# Patient Record
Sex: Female | Born: 1935 | Hispanic: No | Marital: Single | Smoking: Never smoker
Health system: Southern US, Community
[De-identification: ages and names within clinical notes are randomized; demographics above are authoritative.]

## PROBLEM LIST (undated history)

## (undated) DIAGNOSIS — R2681 Unsteadiness on feet: Secondary | ICD-10-CM

## (undated) DIAGNOSIS — E871 Hypo-osmolality and hyponatremia: Secondary | ICD-10-CM

## (undated) DIAGNOSIS — R627 Adult failure to thrive: Secondary | ICD-10-CM

## (undated) DIAGNOSIS — E079 Disorder of thyroid, unspecified: Secondary | ICD-10-CM

## (undated) DIAGNOSIS — R54 Age-related physical debility: Secondary | ICD-10-CM

## (undated) DIAGNOSIS — G250 Essential tremor: Secondary | ICD-10-CM

## (undated) HISTORY — PX: JOINT REPLACEMENT: SHX530

## (undated) HISTORY — DX: Hypo-osmolality and hyponatremia: E87.1

## (undated) HISTORY — DX: Unsteadiness on feet: R26.81

## (undated) HISTORY — DX: Adult failure to thrive: R62.7

---

## 2020-06-12 ENCOUNTER — Encounter: Payer: Self-pay | Admitting: Emergency Medicine

## 2020-06-12 ENCOUNTER — Emergency Department
Admission: EM | Admit: 2020-06-12 | Discharge: 2020-06-12 | Disposition: A | Discharge status: Home / Self Care | Payer: Medicare Other | Attending: Student in an Organized Health Care Education/Training Program | Admitting: Student in an Organized Health Care Education/Training Program

## 2020-06-12 ENCOUNTER — Other Ambulatory Visit: Payer: Self-pay

## 2020-06-12 DIAGNOSIS — R350 Frequency of micturition: Secondary | ICD-10-CM

## 2020-06-12 HISTORY — DX: Essential tremor: G25.0

## 2020-06-12 HISTORY — DX: Disorder of thyroid, unspecified: E07.9

## 2020-06-12 LAB — CBC WITH DIFFERENTIAL/PLATELET
Abs Immature Granulocytes: 0.01 10*3/uL (ref 0.00–0.07)
Basophils Absolute: 0 10*3/uL (ref 0.0–0.1)
Basophils Relative: 1 %
Eosinophils Absolute: 0.1 10*3/uL (ref 0.0–0.5)
Eosinophils Relative: 1 %
HCT: 37.1 % (ref 36.0–46.0)
Hemoglobin: 12.4 g/dL (ref 12.0–15.0)
Immature Granulocytes: 0 %
Lymphocytes Relative: 21 %
Lymphs Abs: 1.3 10*3/uL (ref 0.7–4.0)
MCH: 31.7 pg (ref 26.0–34.0)
MCHC: 33.4 g/dL (ref 30.0–36.0)
MCV: 94.9 fL (ref 80.0–100.0)
Monocytes Absolute: 0.7 10*3/uL (ref 0.1–1.0)
Monocytes Relative: 10 %
Neutro Abs: 4.1 10*3/uL (ref 1.7–7.7)
Neutrophils Relative %: 67 %
Platelets: 230 10*3/uL (ref 150–400)
RBC: 3.91 MIL/uL (ref 3.87–5.11)
RDW: 14.6 % (ref 11.5–15.5)
WBC: 6.2 10*3/uL (ref 4.0–10.5)
nRBC: 0 % (ref 0.0–0.2)

## 2020-06-12 LAB — URINALYSIS, COMPLETE (UACMP) WITH MICROSCOPIC
Bacteria, UA: NONE SEEN
Bilirubin Urine: NEGATIVE
Glucose, UA: NEGATIVE mg/dL
Hgb urine dipstick: NEGATIVE
Ketones, ur: NEGATIVE mg/dL
Leukocytes,Ua: NEGATIVE
Nitrite: NEGATIVE
Protein, ur: NEGATIVE mg/dL
Specific Gravity, Urine: 1.016 (ref 1.005–1.030)
pH: 6 (ref 5.0–8.0)

## 2020-06-12 LAB — BASIC METABOLIC PANEL
Anion gap: 7 (ref 5–15)
BUN: 18 mg/dL (ref 8–23)
CO2: 28 mmol/L (ref 22–32)
Calcium: 8.8 mg/dL — ABNORMAL LOW (ref 8.9–10.3)
Chloride: 99 mmol/L (ref 98–111)
Creatinine, Ser: 0.85 mg/dL (ref 0.44–1.00)
GFR calc Af Amer: >60 mL/min (ref >60)
GFR calc non Af Amer: 56 mL/min — ABNORMAL LOW (ref >60)
Glucose, Bld: 102 mg/dL — ABNORMAL HIGH (ref 70–99)
Potassium: 4.8 mmol/L (ref 3.5–5.1)
Sodium: 134 mmol/L — ABNORMAL LOW (ref 135–145)

## 2020-06-12 NOTE — ED Triage Notes (Signed)
Niece alana lewis HCPOA  says patient has been urinating every 5 minutes and has had increased confusion.  Patient says she did not want to come.  Says she just has a runny nose.  Patient is coopertive at this time.

## 2020-06-12 NOTE — ED Notes (Signed)
Assisted pt to use the toilet 

## 2020-06-12 NOTE — ED Provider Notes (Signed)
[facility] Emergency Department Provider Note    First MD Initiated Contact with Patient 06/12/20 1329     (approximate)  I have reviewed the triage vital signs and the nursing notes.   HISTORY  Chief Complaint Urinary frequency   HPI [name] [name] is a 86 y.o. female with chronic essential tremor presents to the ER for urinary frequency for the past 3 days.  [facility] for [facility]TI symptoms with culture being reportedly negative.  Not had any fevers no back pain.  Family reports increased confusion and decreased oral intake over the past several days.   When asked what brought her to the ER the patient states "my family made mecome.  "   Past Medical History:  Diagnosis Date  . Essential tremor   . Thyroid disease    No family history on file.  There are no problems to display for this patient.     Prior to Admission medications   Not on File    Allergies Patient has no known allergies.    Social History Social History   Tobacco Use  . Smoking status: Not on file  Substance Use Topics  . Alcohol use: Not on file  . Drug use: Not on file    Review of Systems Patient denies headaches, rhinorrhea, blurry vision, numbness, shortness of breath, chest pain, edema, cough, abdominal pain, nausea, vomiting, diarrhea, dysuria, fevers, rashes or hallucinations unless otherwise stated above in HPI. ____________________________________________   PHYSICAL EXAM:  VITAL SIGNS: Vitals:   06/12/20 1430 06/12/20 1500  BP: (!) 129/97 (!) 147/86  Pulse: (!) 59 62  Resp: 15 16  Temp:    SpO2: 96% 96%    Constitutional: Alert, pleasant and appearing  Eyes: Conjunctivae are normal.  Head: Atraumatic. Nose: No congestion/rhinnorhea. Mouth/Throat: Mucous membranes are moist.   Neck: No stridor. Painless ROM.  Cardiovascular: Normal rate, regular rhythm. Grossly normal heart sounds.  Good peripheral  circulation. Respiratory: Normal respiratory effort.  No retractions. Lungs CTAB. Gastrointestinal: Soft and nontender. No distention. No abdominal bruits. No CVA tenderness. Genitourinary:  Musculoskeletal: No lower extremity tenderness nor edema.  No joint effusions. Neurologic:  Normal speech and language. No gross focal neurologic deficits are appreciated. No facial droop Skin:  Skin is warm, dry and intact. No rash noted. Psychiatric: Mood and affect are normal. Speech and behavior are normal.  ____________________________________________   LABS (all labs ordered are listed, but only abnormal results are displayed)  Results for orders placed or performed during the hospital encounter of 06/12/20 (from the past 24 hour(s))  Urinalysis, Complete w Microscopic     Status: Abnormal   Collection Time: 06/12/20  1:50 PM  Result Value Ref Range   Color, Urine YELLOW (A) YELLOW   APPearance CLOUDY (A) CLEAR   Specific Gravity, Urine 1.016 1.005 - 1.030   pH 6.0 5.0 - 8.0   Glucose, UA NEGATIVE NEGATIVE mg/dL   Hgb urine dipstick NEGATIVE NEGATIVE   Bilirubin Urine NEGATIVE NEGATIVE   Ketones, ur NEGATIVE NEGATIVE mg/dL   Protein, ur NEGATIVE NEGATIVE mg/dL   Nitrite NEGATIVE NEGATIVE   Leukocytes,Ua NEGATIVE NEGATIVE   RBC / HPF 0-5 0 - 5 RBC/hpf   WBC, UA 0-5 0 - 5 WBC/hpf   Bacteria, UA NONE SEEN NONE SEEN   Squamous Epithelial / LPF 0-5 0 - 5   Mucus PRESENT   CBC with Differential/Platelet     Status: None   Collection Time:  06/12/20  2:06 PM  Result Value Ref Range   WBC 6.2 4.0 - 10.5 K/uL   RBC 3.91 3.87 - 5.11 MIL/uL   Hemoglobin 12.4 12.0 - 15.0 g/dL   HCT 58.5 36 - 46 %   MCV 94.9 80.0 - 100.0 fL   MCH 31.7 26.0 - 34.0 pg   MCHC 33.4 30.0 - 36.0 g/dL   RDW 27.7 82.4 - 23.5 %   Platelets 230 150 - 400 K/uL   nRBC 0.0 0.0 - 0.2 %   Neutrophils Relative % 67 %   Neutro Abs 4.1 1.7 - 7.7 K/uL   Lymphocytes Relative 21 %   Lymphs Abs 1.3 0.7 - 4.0 K/uL   Monocytes  Relative 10 %   Monocytes Absolute 0.7 0 - 1 K/uL   Eosinophils Relative 1 %   Eosinophils Absolute 0.1 0 - 0 K/uL   Basophils Relative 1 %   Basophils Absolute 0.0 0 - 0 K/uL   Immature Granulocytes 0 %   Abs Immature Granulocytes 0.01 0.00 - 0.07 K/uL  Basic metabolic panel     Status: Abnormal   Collection Time: 06/12/20  2:06 PM  Result Value Ref Range   Sodium 134 (L) 135 - 145 mmol/L   Potassium 4.8 3.5 - 5.1 mmol/L   Chloride 99 98 - 111 mmol/L   CO2 28 22 - 32 mmol/L   Glucose, Bld 102 (H) 70 - 99 mg/dL   BUN 18 8 - 23 mg/dL   Creatinine, Ser 3.61 0.44 - 1.00 mg/dL   Calcium 8.8 (L) 8.9 - 10.3 mg/dL   GFR calc non Af Amer 56 (L) >60 mL/min   GFR calc Af Amer >60 >60 mL/min   Anion gap 7 5 - 15   ____________________________________________ ____________________________________________  RADIOLOGY   ____________________________________________   PROCEDURES  Procedure(s) performed:  Procedures    Critical Care performed: no ____________________________________________   INITIAL IMPRESSION / ASSESSMENT AND PLAN / ED COURSE  Pertinent labs & imaging results that were available during my care of the patient were reviewed by me and considered in my medical decision making (see chart for details).   DDX: cystitis, aki, electrolyte abn  [name] [name] is a 86 y.o. who presents to the ED with days of urinary frequency.  Patient well-appearing in no acute distress.  Blood work checked and is reassuring.  No signs of UTI.  Family feels reassured by this.  She has no other additional complaints.  Will add on culture.  We discussed signs symptoms which she should return to the ER.     The patient was evaluated in Emergency Department today for the symptoms described in the history of present illness. He/she was evaluated in the context of the global COVID-19 pandemic, which necessitated consideration that the patient might be at risk for infection with the SARS-CoV-2  virus that causes COVID-19. Institutional protocols and algorithms that pertain to the evaluation of patients at risk for COVID-19 are in a state of rapid change based on information released by regulatory bodies including the CDC and federal and state organizations. These policies and algorithms were followed during the patient's care in the ED.  As part of my medical decision making, I reviewed the following data within the electronic MEDICAL RECORD NUMBER Nursing notes reviewed and incorporated, Labs reviewed, notes from prior ED visits and [place] Controlled Substance Database   ____________________________________________   FINAL CLINICAL IMPRESSION(S) / ED DIAGNOSES  Final diagnoses:  Urinary frequency  NEW MEDICATIONS STARTED DURING THIS VISIT:  New Prescriptions   No medications on file     Note:  This document was prepared using Dragon voice recognition software and may include unintentional dictation errors.    Willy Eddy, MD 06/12/20 [phone]

## 2020-06-12 NOTE — ED Notes (Signed)
Pt niece states pt has not had good intake x 1.5 weeks since returning home from vacation.

## 2020-06-12 NOTE — ED Triage Notes (Signed)
Arrives to ED with niece.  Seen through [facility] in April for frequent urination.  States urinary frequency has worsened, worsening confusion in the past 2-3 weeks.

## 2020-06-14 LAB — URINE CULTURE

## 2020-06-17 ENCOUNTER — Other Ambulatory Visit: Payer: Self-pay

## 2020-06-17 ENCOUNTER — Ambulatory Visit (INDEPENDENT_AMBULATORY_CARE_PROVIDER_SITE_OTHER): Payer: Medicare Other | Admitting: Podiatry

## 2020-06-17 DIAGNOSIS — M79676 Pain in unspecified toe(s): Secondary | ICD-10-CM

## 2020-06-17 DIAGNOSIS — B351 Tinea unguium: Secondary | ICD-10-CM

## 2020-06-17 NOTE — Progress Notes (Signed)
  Subjective:  Patient ID: [name] [name], female    DOB: July 25, 1918,  MRN: [id number] HPI Chief Complaint  Patient presents with  . Nail Problem    Nail trim    86 y.o. female presents with the above complaint.   ROS: Denies fever chills nausea vomiting muscle aches pains calf pain back pain chest pain shortness of breath.   Past Medical History:  Diagnosis Date  . Essential tremor   . Thyroid disease      Current Outpatient Medications:  .  carbidopa-levodopa (SINEMET) 25-100 MG tablet, Take 1 tablet by mouth 3 (three) times daily. Take 1 qam, 1at supper, 2 qhs, Disp: , Rfl:  .  cyanocobalamin 100 MCG tablet, Take 100 mcg by mouth daily., Disp: , Rfl:  .  levothyroxine (SYNTHROID) 100 MCG tablet, Take 100 mcg by mouth daily before breakfast., Disp: , Rfl:  .  Multiple Vitamins-Minerals (CENTRUM SILVER) CHEW, Chew by mouth., Disp: , Rfl:  .  primidone (MYSOLINE) 50 MG tablet, Take by mouth at bedtime., Disp: , Rfl:   No Known Allergies Review of Systems Objective:  There were no vitals filed for this visit.  General: Well developed, nourished, in no acute distress, alert and oriented x3   Dermatological: Skin is warm, dry and supple bilateral. Nails x 10 are poorly maintained and neglected.  They are thick yellow dystrophic possibly mycotic; remaining integument appears unremarkable at this time. There are no open sores, no preulcerative lesions, no rash or signs of infection present.  Vascular: Dorsalis Pedis artery and Posterior Tibial artery pedal pulses are 2/4 bilateral with immedate capillary fill time. Pedal hair growth present. No varicosities and no lower extremity edema present bilateral.   Neruologic: Grossly intact via light touch bilateral. Vibratory intact via tuning fork bilateral. Protective threshold with Semmes Wienstein monofilament intact to all pedal sites bilateral. Patellar and Achilles deep tendon reflexes 2+ bilateral. No Babinski or clonus noted  bilateral.   Musculoskeletal: No gross boney pedal deformities bilateral. No pain, crepitus, or limitation noted with foot and ankle range of motion bilateral. Muscular strength 5/5 in all groups tested bilateral.  Gait: Unassisted, Nonantalgic.    Radiographs:  None taken  Assessment & Plan:   Assessment: Pain in limb secondary to onychomycosis  Plan: Debridement of toenails 1 through 5 bilateral discomfort service secondary to pain.     Leylani Duley T. [place], [place]

## 2020-08-21 ENCOUNTER — Encounter: Payer: Self-pay | Admitting: Adult Health

## 2020-08-21 ENCOUNTER — Ambulatory Visit (INDEPENDENT_AMBULATORY_CARE_PROVIDER_SITE_OTHER): Payer: Medicare Other | Admitting: Adult Health

## 2020-08-21 ENCOUNTER — Other Ambulatory Visit: Payer: Self-pay

## 2020-08-21 DIAGNOSIS — R2681 Unsteadiness on feet: Secondary | ICD-10-CM

## 2020-08-21 DIAGNOSIS — G250 Essential tremor: Secondary | ICD-10-CM

## 2020-08-21 DIAGNOSIS — R627 Adult failure to thrive: Secondary | ICD-10-CM

## 2020-08-21 DIAGNOSIS — E871 Hypo-osmolality and hyponatremia: Secondary | ICD-10-CM | POA: Diagnosis not present

## 2020-08-21 NOTE — Progress Notes (Signed)
[facility] [address] [place], [place] [address]  Internal MEDICINE  Telephone Visit  Patient Name: [name] [name]  [id number]  [id number]  Date of Service: 08/21/2020  I connected with the patient at 225 by telephone and verified the patients identity using two identifiers.   I discussed the limitations, risks, security and privacy concerns of performing an evaluation and management service by telephone and the availability of in person appointments. I also discussed with the patient that there may be a patient responsible charge related to the service.  The patient expressed understanding and agrees to proceed.    Chief Complaint  Patient presents with  . New Patient (Initial Visit)    HPI  Pt is seen via telephone.  She is has moved up to [place] from [place] to live with family. She lives here with her niece. She was living alone in [place]. She has a history of essential tremors, hypothyroid, Senile asthenia, costochondritis, hyponatremia, hypoglycemia, and unsteady gait.    She has been sleeping approximately 20 hours a day, and not eating very much.  They are interested in a a hospice referral to help with home needs and end of life care.      Current Medication: Outpatient Encounter Medications as of 08/21/2020  Medication Sig  . carbidopa-levodopa (SINEMET IR) 25-100 MG tablet Take 1 tablet by mouth 3 (three) times daily.  Marland Kitchen levothyroxine (SYNTHROID) 100 MCG tablet Take 100 mcg by mouth daily before breakfast.  . primidone (MYSOLINE) 50 MG tablet Take 50 mg by mouth at bedtime.   No facility-administered encounter medications on file as of 08/21/2020.    Surgical History: History reviewed. No pertinent surgical history.  Medical History: Past Medical History:  Diagnosis Date  . Essential tremor   . Failure to thrive in adult   . Hyponatremia   . Unsteady gait     Family History: History reviewed. No pertinent family history.  Social History    Socioeconomic History  . Marital status: Single    Spouse name: Not on file  . Number of children: Not on file  . Years of education: Not on file  . Highest education level: Not on file  Occupational History  . Not on file  Tobacco Use  . Smoking status: Never Smoker  . Smokeless tobacco: Never Used  Substance and Sexual Activity  . Alcohol use: Never  . Drug use: Never  . Sexual activity: Not on file  Other Topics Concern  . Not on file  Social History Narrative  . Not on file   Social Determinants of Health   Financial Resource Strain:   . Difficulty of Paying Living Expenses: Not on file  Food Insecurity:   . Worried About Programme researcher, broadcasting/film/video in the Last Year: Not on file  . Ran Out of Food in the Last Year: Not on file  Transportation Needs:   . Lack of Transportation (Medical): Not on file  . Lack of Transportation (Non-Medical): Not on file  Physical Activity:   . Days of Exercise per Week: Not on file  . Minutes of Exercise per Session: Not on file  Stress:   . Feeling of Stress : Not on file  Social Connections:   . Frequency of Communication with Friends and Family: Not on file  . Frequency of Social Gatherings with Friends and Family: Not on file  . Attends Religious Services: Not on file  . Active Member of Clubs or Organizations: Not on file  .  Attends Banker Meetings: Not on file  . Marital Status: Not on file  Intimate Partner Violence:   . Fear of Current or Ex-Partner: Not on file  . Emotionally Abused: Not on file  . Physically Abused: Not on file  . Sexually Abused: Not on file      Review of Systems  Constitutional: Negative for chills, fatigue and unexpected weight change.  HENT: Negative for congestion, rhinorrhea, sneezing and sore throat.   Eyes: Negative for photophobia, pain and redness.  Respiratory: Negative for cough, chest tightness and shortness of breath.   Cardiovascular: Negative for chest pain and  palpitations.  Gastrointestinal: Negative for abdominal pain, constipation, diarrhea, nausea and vomiting.  Endocrine: Negative.   Genitourinary: Negative for dysuria and frequency.  Musculoskeletal: Negative for arthralgias, back pain, joint swelling and neck pain.  Skin: Negative for rash.  Allergic/Immunologic: Negative.   Neurological: Negative for tremors and numbness.  Hematological: Negative for adenopathy. Does not bruise/bleed easily.  Psychiatric/Behavioral: Negative for behavioral problems and sleep disturbance. The patient is not nervous/anxious.     Vital Signs: There were no vitals taken for this visit.   Observation/Objective:  Weak sounding.   Assessment/Plan: 1. Failure to thrive in adult Hospice referral per patient/family request.   2. Essential tremor Continue Carbodopa-levodopa as prescribed.   3. Hyponatremia No recent issues.  4. Unsteady gait Patient uses [facility] in home.   General Counseling: [name] [name] verbalizes understanding of the findings of today's phone visit and agrees with plan of treatment. I have discussed any further diagnostic evaluation that may be needed or ordered today. We also reviewed her medications today. she has been encouraged to call the office with any questions or concerns that should arise related to todays visit.    No orders of the defined types were placed in this encounter.   No orders of the defined types were placed in this encounter.   Time spent: 25 Minutes    Blima Ledger [facility] Internal medicine

## 2020-08-24 ENCOUNTER — Encounter: Payer: Self-pay | Admitting: Emergency Medicine

## 2020-09-21 ENCOUNTER — Ambulatory Visit (INDEPENDENT_AMBULATORY_CARE_PROVIDER_SITE_OTHER): Payer: Medicare Other | Admitting: Podiatry

## 2020-09-21 ENCOUNTER — Other Ambulatory Visit: Payer: Self-pay

## 2020-09-21 ENCOUNTER — Encounter: Payer: Self-pay | Admitting: Podiatry

## 2020-09-21 DIAGNOSIS — M79676 Pain in unspecified toe(s): Secondary | ICD-10-CM | POA: Diagnosis not present

## 2020-09-21 DIAGNOSIS — B351 Tinea unguium: Secondary | ICD-10-CM

## 2020-09-21 NOTE — Progress Notes (Signed)
She presents today chief complaint of painfully elongated toenails 1 through 5 bilaterally.  Objective: Vital signs are stable she is alert oriented x3 pulses are palpable.  Hallux left demonstrates subungual abscess which once the nail was debrided today I cleaned up the abscess and placed silver nitrate.  She tolerated this well I debrided the rest of the nails which were thick yellow dystrophic-like mycotic.  Assessment: Pain in limb secondary to onychomycosis subungual abscess.  Plan: Debridement of all reactive hyperkeratotic tissue debrided nails 1 through 5 bilaterally now follow-up with her in 2 to 3 months

## 2020-11-06 ENCOUNTER — Emergency Department: Payer: Medicare Other

## 2020-11-06 ENCOUNTER — Other Ambulatory Visit: Payer: Self-pay

## 2020-11-06 ENCOUNTER — Inpatient Hospital Stay
Admission: EM | Admit: 2020-11-06 | Discharge: 2020-11-13 | DRG: 521 | Disposition: A | Discharge status: Skilled Nursing Facility | Payer: Medicare Other | Attending: Internal Medicine | Admitting: Internal Medicine

## 2020-11-06 ENCOUNTER — Encounter
Admission: EM | Disposition: A | Discharge status: Skilled Nursing Facility | Payer: Self-pay | Source: Home / Self Care | Attending: Internal Medicine

## 2020-11-06 ENCOUNTER — Inpatient Hospital Stay: Payer: Medicare Other

## 2020-11-06 ENCOUNTER — Inpatient Hospital Stay: Payer: Medicare Other | Admitting: Certified Registered"

## 2020-11-06 ENCOUNTER — Inpatient Hospital Stay: Admit: 2020-11-06 | Payer: TRICARE For Life (TFL) | Admitting: Surgery

## 2020-11-06 DIAGNOSIS — J439 Emphysema, unspecified: Secondary | ICD-10-CM | POA: Diagnosis present

## 2020-11-06 DIAGNOSIS — Y92002 Bathroom of unspecified non-institutional (private) residence single-family (private) house as the place of occurrence of the external cause: Secondary | ICD-10-CM

## 2020-11-06 DIAGNOSIS — Z96649 Presence of unspecified artificial hip joint: Secondary | ICD-10-CM

## 2020-11-06 DIAGNOSIS — E039 Hypothyroidism, unspecified: Secondary | ICD-10-CM | POA: Diagnosis not present

## 2020-11-06 DIAGNOSIS — S72001A Fracture of unspecified part of neck of right femur, initial encounter for closed fracture: Principal | ICD-10-CM | POA: Insufficient documentation

## 2020-11-06 DIAGNOSIS — E43 Unspecified severe protein-calorie malnutrition: Secondary | ICD-10-CM | POA: Diagnosis present

## 2020-11-06 DIAGNOSIS — J9611 Chronic respiratory failure with hypoxia: Secondary | ICD-10-CM | POA: Diagnosis present

## 2020-11-06 DIAGNOSIS — D649 Anemia, unspecified: Secondary | ICD-10-CM

## 2020-11-06 DIAGNOSIS — G250 Essential tremor: Secondary | ICD-10-CM | POA: Diagnosis present

## 2020-11-06 DIAGNOSIS — G309 Alzheimer's disease, unspecified: Secondary | ICD-10-CM | POA: Diagnosis present

## 2020-11-06 DIAGNOSIS — Z66 Do not resuscitate: Secondary | ICD-10-CM | POA: Diagnosis present

## 2020-11-06 DIAGNOSIS — W010XXA Fall on same level from slipping, tripping and stumbling without subsequent striking against object, initial encounter: Secondary | ICD-10-CM | POA: Diagnosis present

## 2020-11-06 DIAGNOSIS — D62 Acute posthemorrhagic anemia: Secondary | ICD-10-CM | POA: Diagnosis not present

## 2020-11-06 DIAGNOSIS — H9190 Unspecified hearing loss, unspecified ear: Secondary | ICD-10-CM | POA: Diagnosis present

## 2020-11-06 DIAGNOSIS — Z79899 Other long term (current) drug therapy: Secondary | ICD-10-CM | POA: Diagnosis not present

## 2020-11-06 DIAGNOSIS — R0902 Hypoxemia: Secondary | ICD-10-CM

## 2020-11-06 DIAGNOSIS — Z79890 Hormone replacement therapy (postmenopausal): Secondary | ICD-10-CM

## 2020-11-06 DIAGNOSIS — Z681 Body mass index (BMI) 19 or less, adult: Secondary | ICD-10-CM

## 2020-11-06 DIAGNOSIS — W19XXXA Unspecified fall, initial encounter: Secondary | ICD-10-CM | POA: Diagnosis not present

## 2020-11-06 DIAGNOSIS — Z20822 Contact with and (suspected) exposure to covid-19: Secondary | ICD-10-CM | POA: Diagnosis present

## 2020-11-06 DIAGNOSIS — Y92009 Unspecified place in unspecified non-institutional (private) residence as the place of occurrence of the external cause: Secondary | ICD-10-CM

## 2020-11-06 DIAGNOSIS — Z01818 Encounter for other preprocedural examination: Secondary | ICD-10-CM

## 2020-11-06 DIAGNOSIS — R636 Underweight: Secondary | ICD-10-CM | POA: Diagnosis not present

## 2020-11-06 DIAGNOSIS — F09 Unspecified mental disorder due to known physiological condition: Secondary | ICD-10-CM

## 2020-11-06 DIAGNOSIS — F0280 Dementia in other diseases classified elsewhere without behavioral disturbance: Secondary | ICD-10-CM | POA: Diagnosis not present

## 2020-11-06 HISTORY — PX: HIP ARTHROPLASTY: SHX981

## 2020-11-06 HISTORY — DX: Age-related physical debility: R54

## 2020-11-06 LAB — COMPREHENSIVE METABOLIC PANEL
ALT: <5 U/L (ref 0–44)
AST: 30 U/L (ref 15–41)
Albumin: 3.4 g/dL — ABNORMAL LOW (ref 3.5–5.0)
Alkaline Phosphatase: 97 U/L (ref 38–126)
Anion gap: 8 (ref 5–15)
BUN: 25 mg/dL — ABNORMAL HIGH (ref 8–23)
CO2: 27 mmol/L (ref 22–32)
Calcium: 8.8 mg/dL — ABNORMAL LOW (ref 8.9–10.3)
Chloride: 99 mmol/L (ref 98–111)
Creatinine, Ser: 0.74 mg/dL (ref 0.44–1.00)
GFR, Estimated: >60 mL/min (ref >60)
Glucose, Bld: 97 mg/dL (ref 70–99)
Potassium: 4 mmol/L (ref 3.5–5.1)
Sodium: 134 mmol/L — ABNORMAL LOW (ref 135–145)
Total Bilirubin: 0.5 mg/dL (ref 0.3–1.2)
Total Protein: 6.3 g/dL — ABNORMAL LOW (ref 6.5–8.1)

## 2020-11-06 LAB — CBC WITH DIFFERENTIAL/PLATELET
Abs Immature Granulocytes: 0.1 10*3/uL — ABNORMAL HIGH (ref 0.00–0.07)
Basophils Absolute: 0 10*3/uL (ref 0.0–0.1)
Basophils Relative: 0 %
Eosinophils Absolute: 0.1 10*3/uL (ref 0.0–0.5)
Eosinophils Relative: 2 %
HCT: 36 % (ref 36.0–46.0)
Hemoglobin: 11.8 g/dL — ABNORMAL LOW (ref 12.0–15.0)
Immature Granulocytes: 2 %
Lymphocytes Relative: 34 %
Lymphs Abs: 2.3 10*3/uL (ref 0.7–4.0)
MCH: 31.6 pg (ref 26.0–34.0)
MCHC: 32.8 g/dL (ref 30.0–36.0)
MCV: 96.3 fL (ref 80.0–100.0)
Monocytes Absolute: 0.6 10*3/uL (ref 0.1–1.0)
Monocytes Relative: 9 %
Neutro Abs: 3.6 10*3/uL (ref 1.7–7.7)
Neutrophils Relative %: 53 %
Platelets: 243 10*3/uL (ref 150–400)
RBC: 3.74 MIL/uL — ABNORMAL LOW (ref 3.87–5.11)
RDW: 14.6 % (ref 11.5–15.5)
WBC: 6.8 10*3/uL (ref 4.0–10.5)
nRBC: 0 % (ref 0.0–0.2)

## 2020-11-06 LAB — URINALYSIS, ROUTINE W REFLEX MICROSCOPIC
Bilirubin Urine: NEGATIVE
Glucose, UA: NEGATIVE mg/dL
Hgb urine dipstick: NEGATIVE
Ketones, ur: NEGATIVE mg/dL
Leukocytes,Ua: NEGATIVE
Nitrite: NEGATIVE
Protein, ur: NEGATIVE mg/dL
Specific Gravity, Urine: 1.02 (ref 1.005–1.030)
pH: 6 (ref 5.0–8.0)

## 2020-11-06 LAB — PROTIME-INR
INR: 1 (ref 0.8–1.2)
Prothrombin Time: 13 seconds (ref 11.4–15.2)

## 2020-11-06 LAB — TYPE AND SCREEN
ABO/RH(D): B POS
Antibody Screen: NEGATIVE

## 2020-11-06 LAB — RESP PANEL BY RT-PCR (FLU A&B, COVID) ARPGX2
Influenza A by PCR: NEGATIVE
Influenza B by PCR: NEGATIVE
SARS Coronavirus 2 by RT PCR: NEGATIVE

## 2020-11-06 LAB — APTT: aPTT: 31 seconds (ref 24–36)

## 2020-11-06 LAB — SURGICAL PCR SCREEN
MRSA, PCR: POSITIVE — AB
Staphylococcus aureus: POSITIVE — AB

## 2020-11-06 SURGERY — HEMIARTHROPLASTY, HIP, DIRECT ANTERIOR APPROACH, FOR FRACTURE
Anesthesia: Spinal | Site: Hip | Laterality: Right

## 2020-11-06 MED ORDER — MAGNESIUM HYDROXIDE 400 MG/5ML PO SUSP
30.0000 mL | Freq: Every day | ORAL | Status: DC | PRN
  Administered 2020-11-08: 30 mL via ORAL
  Filled 2020-11-06: qty 30

## 2020-11-06 MED ORDER — ENSURE ENLIVE PO LIQD
237.0000 mL | ORAL | Status: DC
  Administered 2020-11-06 – 2020-11-11 (×6): 237 mL via ORAL
  Filled 2020-11-06: qty 237

## 2020-11-06 MED ORDER — BUPIVACAINE-EPINEPHRINE (PF) 0.5% -1:200000 IJ SOLN
INTRAMUSCULAR | Status: DC | PRN
  Administered 2020-11-06: 30 mL

## 2020-11-06 MED ORDER — LEVOTHYROXINE SODIUM 100 MCG PO TABS
100.0000 ug | ORAL_TABLET | Freq: Every day | ORAL | Status: DC
  Administered 2020-11-07 – 2020-11-13 (×7): 100 ug via ORAL
  Filled 2020-11-06 (×2): qty 1
  Filled 2020-11-06: qty 2
  Filled 2020-11-06 (×2): qty 1
  Filled 2020-11-06 (×3): qty 2
  Filled 2020-11-06 (×3): qty 1

## 2020-11-06 MED ORDER — DOCUSATE SODIUM 100 MG PO CAPS
100.0000 mg | ORAL_CAPSULE | Freq: Two times a day (BID) | ORAL | Status: DC
  Administered 2020-11-06 – 2020-11-13 (×10): 100 mg via ORAL
  Filled 2020-11-06 (×12): qty 1

## 2020-11-06 MED ORDER — CEFAZOLIN SODIUM-DEXTROSE 2-4 GM/100ML-% IV SOLN
2.0000 g | INTRAVENOUS | Status: AC
  Administered 2020-11-06: 2 g via INTRAVENOUS

## 2020-11-06 MED ORDER — CARBIDOPA-LEVODOPA 25-100 MG PO TABS: 1.0000 | ORAL_TABLET | Freq: Three times a day (TID) | ORAL | Status: DC

## 2020-11-06 MED ORDER — MORPHINE SULFATE (PF) 4 MG/ML IV SOLN
4.0000 mg | INTRAVENOUS | Status: AC | PRN
  Administered 2020-11-06 (×2): 4 mg via INTRAVENOUS
  Filled 2020-11-06 (×2): qty 1

## 2020-11-06 MED ORDER — TRANEXAMIC ACID 1000 MG/10ML IV SOLN
INTRAVENOUS | Status: DC | PRN
  Administered 2020-11-06: 10 mg via TOPICAL

## 2020-11-06 MED ORDER — ONDANSETRON HCL 4 MG/2ML IJ SOLN: 4.0000 mg | Freq: Once | INTRAMUSCULAR | Status: DC | PRN

## 2020-11-06 MED ORDER — SODIUM CHLORIDE 0.9 % IV SOLN: INTRAVENOUS | Status: DC

## 2020-11-06 MED ORDER — ONDANSETRON HCL 4 MG/2ML IJ SOLN
INTRAMUSCULAR | Status: AC
  Filled 2020-11-06: qty 2

## 2020-11-06 MED ORDER — PRIMIDONE 50 MG PO TABS
50.0000 mg | ORAL_TABLET | Freq: Every day | ORAL | Status: DC
  Administered 2020-11-06 – 2020-11-12 (×7): 50 mg via ORAL
  Filled 2020-11-06 (×8): qty 1

## 2020-11-06 MED ORDER — PHENYLEPHRINE HCL (PRESSORS) 10 MG/ML IV SOLN
INTRAVENOUS | Status: DC | PRN
  Administered 2020-11-06: 100 ug via INTRAVENOUS

## 2020-11-06 MED ORDER — BISACODYL 10 MG RE SUPP
10.0000 mg | Freq: Every day | RECTAL | Status: DC | PRN
  Administered 2020-11-13: 10 mg via RECTAL
  Filled 2020-11-06 (×2): qty 1

## 2020-11-06 MED ORDER — MORPHINE SULFATE (PF) 2 MG/ML IV SOLN
0.5000 mg | INTRAVENOUS | Status: DC | PRN
  Administered 2020-11-06: 0.5 mg via INTRAVENOUS
  Filled 2020-11-06: qty 1

## 2020-11-06 MED ORDER — HYDROCODONE-ACETAMINOPHEN 5-325 MG PO TABS
1.0000 | ORAL_TABLET | Freq: Four times a day (QID) | ORAL | Status: DC | PRN
  Administered 2020-11-06 – 2020-11-07 (×4): 1 via ORAL
  Administered 2020-11-09: 2 via ORAL
  Administered 2020-11-10 – 2020-11-11 (×2): 1 via ORAL
  Filled 2020-11-06 (×2): qty 1
  Filled 2020-11-06: qty 2
  Filled 2020-11-06 (×6): qty 1

## 2020-11-06 MED ORDER — KETAMINE HCL 50 MG/ML IJ SOLN
INTRAMUSCULAR | Status: AC
  Filled 2020-11-06: qty 10

## 2020-11-06 MED ORDER — PROPOFOL 500 MG/50ML IV EMUL
INTRAVENOUS | Status: AC
  Filled 2020-11-06: qty 50

## 2020-11-06 MED ORDER — BUPIVACAINE LIPOSOME 1.3 % IJ SUSP
INTRAMUSCULAR | Status: DC | PRN
  Administered 2020-11-06: 20 mL

## 2020-11-06 MED ORDER — METOCLOPRAMIDE HCL 10 MG PO TABS: 5.0000 mg | ORAL_TABLET | Freq: Three times a day (TID) | ORAL | Status: DC | PRN

## 2020-11-06 MED ORDER — CENTRUM SILVER PO CHEW: 1.0000 | CHEWABLE_TABLET | Freq: Every day | ORAL | Status: DC

## 2020-11-06 MED ORDER — ENOXAPARIN SODIUM 30 MG/0.3ML ~~LOC~~ SOLN
30.0000 mg | SUBCUTANEOUS | Status: DC
  Administered 2020-11-07 – 2020-11-13 (×7): 30 mg via SUBCUTANEOUS
  Filled 2020-11-06 (×7): qty 0.3

## 2020-11-06 MED ORDER — DIPHENHYDRAMINE HCL 12.5 MG/5ML PO ELIX: 12.5000 mg | ORAL_SOLUTION | ORAL | Status: DC | PRN

## 2020-11-06 MED ORDER — CARBIDOPA-LEVODOPA 25-100 MG PO TABS
2.0000 | ORAL_TABLET | Freq: Every day | ORAL | Status: DC
  Administered 2020-11-06 – 2020-11-12 (×7): 2 via ORAL
  Filled 2020-11-06 (×8): qty 2

## 2020-11-06 MED ORDER — ONDANSETRON HCL 4 MG/2ML IJ SOLN
4.0000 mg | Freq: Once | INTRAMUSCULAR | Status: AC
  Administered 2020-11-06: 4 mg via INTRAVENOUS
  Filled 2020-11-06: qty 2

## 2020-11-06 MED ORDER — PHENYLEPHRINE HCL (PRESSORS) 10 MG/ML IV SOLN
INTRAVENOUS | Status: AC
  Filled 2020-11-06: qty 1

## 2020-11-06 MED ORDER — PROPOFOL 500 MG/50ML IV EMUL
INTRAVENOUS | Status: DC | PRN
  Administered 2020-11-06: 10 ug/kg/min via INTRAVENOUS

## 2020-11-06 MED ORDER — LORAZEPAM 0.5 MG PO TABS
0.5000 mg | ORAL_TABLET | ORAL | Status: DC | PRN
  Administered 2020-11-07: 0.5 mg via ORAL
  Filled 2020-11-06: qty 1

## 2020-11-06 MED ORDER — BUPIVACAINE HCL (PF) 0.5 % IJ SOLN
INTRAMUSCULAR | Status: AC
  Filled 2020-11-06: qty 10

## 2020-11-06 MED ORDER — CHLORHEXIDINE GLUCONATE CLOTH 2 % EX PADS
6.0000 | MEDICATED_PAD | Freq: Every day | CUTANEOUS | Status: DC
  Administered 2020-11-09 – 2020-11-13 (×3): 6 via TOPICAL

## 2020-11-06 MED ORDER — BUPIVACAINE HCL (PF) 0.5 % IJ SOLN
INTRAMUSCULAR | Status: DC | PRN
  Administered 2020-11-06: 2.5 mL

## 2020-11-06 MED ORDER — CEFAZOLIN SODIUM-DEXTROSE 2-4 GM/100ML-% IV SOLN
2.0000 g | Freq: Four times a day (QID) | INTRAVENOUS | Status: AC
  Administered 2020-11-06 – 2020-11-07 (×3): 2 g via INTRAVENOUS
  Filled 2020-11-06 (×3): qty 100

## 2020-11-06 MED ORDER — ONDANSETRON HCL 4 MG PO TABS: 4.0000 mg | ORAL_TABLET | Freq: Four times a day (QID) | ORAL | Status: DC | PRN

## 2020-11-06 MED ORDER — ONDANSETRON HCL 4 MG/2ML IJ SOLN: 4.0000 mg | Freq: Four times a day (QID) | INTRAMUSCULAR | Status: DC | PRN

## 2020-11-06 MED ORDER — SODIUM CHLORIDE 0.9 % IV SOLN
INTRAVENOUS | Status: DC | PRN
  Administered 2020-11-06: 50 ug/min via INTRAVENOUS

## 2020-11-06 MED ORDER — CEFAZOLIN SODIUM-DEXTROSE 2-4 GM/100ML-% IV SOLN
INTRAVENOUS | Status: AC
  Filled 2020-11-06: qty 100

## 2020-11-06 MED ORDER — FLEET ENEMA 7-19 GM/118ML RE ENEM: 1.0000 | ENEMA | Freq: Once | RECTAL | Status: DC | PRN

## 2020-11-06 MED ORDER — VITAMIN B-12 100 MCG PO TABS
100.0000 ug | ORAL_TABLET | Freq: Every day | ORAL | Status: DC
  Administered 2020-11-06 – 2020-11-13 (×8): 100 ug via ORAL
  Filled 2020-11-06 (×9): qty 1

## 2020-11-06 MED ORDER — ACETAMINOPHEN 325 MG PO TABS: 325.0000 mg | ORAL_TABLET | Freq: Four times a day (QID) | ORAL | Status: DC | PRN

## 2020-11-06 MED ORDER — CARBIDOPA-LEVODOPA 25-100 MG PO TABS
1.0000 | ORAL_TABLET | Freq: Every day | ORAL | Status: DC
  Filled 2020-11-06: qty 1

## 2020-11-06 MED ORDER — CARBIDOPA-LEVODOPA 25-100 MG PO TABS
1.0000 | ORAL_TABLET | Freq: Every day | ORAL | Status: DC
  Administered 2020-11-06 – 2020-11-09 (×4): 1 via ORAL
  Filled 2020-11-06 (×4): qty 1

## 2020-11-06 MED ORDER — KETAMINE HCL 50 MG/ML IJ SOLN
INTRAMUSCULAR | Status: DC | PRN
  Administered 2020-11-06 (×3): 10 mg via INTRAMUSCULAR
  Administered 2020-11-06: 5 mg via INTRAMUSCULAR

## 2020-11-06 MED ORDER — TRAMADOL HCL 50 MG PO TABS
50.0000 mg | ORAL_TABLET | Freq: Four times a day (QID) | ORAL | Status: DC | PRN
  Administered 2020-11-08: 50 mg via ORAL
  Filled 2020-11-06: qty 1

## 2020-11-06 MED ORDER — ADULT MULTIVITAMIN W/MINERALS CH
1.0000 | ORAL_TABLET | Freq: Every day | ORAL | Status: DC
  Administered 2020-11-06 – 2020-11-13 (×8): 1 via ORAL
  Filled 2020-11-06 (×7): qty 1

## 2020-11-06 MED ORDER — PROPOFOL 10 MG/ML IV BOLUS
INTRAVENOUS | Status: DC | PRN
  Administered 2020-11-06: 20 mg via INTRAVENOUS
  Administered 2020-11-06: 10 mg via INTRAVENOUS

## 2020-11-06 MED ORDER — METOCLOPRAMIDE HCL 5 MG/ML IJ SOLN: 5.0000 mg | Freq: Three times a day (TID) | INTRAMUSCULAR | Status: DC | PRN

## 2020-11-06 MED ORDER — SODIUM CHLORIDE (PF) 0.9 % IJ SOLN
INTRAMUSCULAR | Status: DC | PRN
  Administered 2020-11-06: 40 mL

## 2020-11-06 MED ORDER — FENTANYL CITRATE (PF) 100 MCG/2ML IJ SOLN: 25.0000 ug | INTRAMUSCULAR | Status: DC | PRN

## 2020-11-06 MED ORDER — ACETAMINOPHEN 500 MG PO TABS
500.0000 mg | ORAL_TABLET | Freq: Four times a day (QID) | ORAL | Status: AC
  Administered 2020-11-06 – 2020-11-07 (×4): 500 mg via ORAL
  Filled 2020-11-06 (×4): qty 1

## 2020-11-06 SURGICAL SUPPLY — 69 items
BAG DECANTER FOR FLEXI CONT (MISCELLANEOUS) IMPLANT
BLADE SAGITTAL WIDE XTHICK NO (BLADE) ×3 IMPLANT
BLADE SURG SZ20 CARB STEEL (BLADE) ×3 IMPLANT
BNDG COHESIVE 6X5 TAN STRL LF (GAUZE/BANDAGES/DRESSINGS) ×3 IMPLANT
BOWL CEMENT MIXING ADV NOZZLE (MISCELLANEOUS) IMPLANT
CANISTER SUCT 1200ML W/VALVE (MISCELLANEOUS) ×3 IMPLANT
CANISTER SUCT 3000ML PPV (MISCELLANEOUS) ×6 IMPLANT
CEMENT BONE 40GM (Cement) ×6 IMPLANT
CEMENT RESTRICTOR DEPUY SZ 3 (Cement) ×3 IMPLANT
CHLORAPREP W/TINT 26 (MISCELLANEOUS) ×6 IMPLANT
COVER WAND RF STERILE (DRAPES) ×3 IMPLANT
DECANTER SPIKE VIAL GLASS SM (MISCELLANEOUS) ×6 IMPLANT
DRAPE 3/4 80X56 (DRAPES) ×3 IMPLANT
DRAPE INCISE IOBAN 66X60 STRL (DRAPES) ×3 IMPLANT
DRAPE SPLIT 6X30 W/TAPE (DRAPES) ×6 IMPLANT
DRAPE SURG 17X11 SM STRL (DRAPES) ×3 IMPLANT
DRAPE SURG 17X23 STRL (DRAPES) ×3 IMPLANT
DRSG OPSITE POSTOP 4X12 (GAUZE/BANDAGES/DRESSINGS) ×3 IMPLANT
DRSG OPSITE POSTOP 4X14 (GAUZE/BANDAGES/DRESSINGS) IMPLANT
DRSG OPSITE POSTOP 4X8 (GAUZE/BANDAGES/DRESSINGS) ×6 IMPLANT
ELECT BLADE 6.5 EXT (BLADE) ×3 IMPLANT
ELECT CAUTERY BLADE 6.4 (BLADE) ×3 IMPLANT
ELECT REM PT RETURN 9FT ADLT (ELECTROSURGICAL) ×3
ELECTRODE REM PT RTRN 9FT ADLT (ELECTROSURGICAL) ×1 IMPLANT
GAUZE PACK 2X3YD (PACKING) IMPLANT
GLOVE BIO SURGEON STRL SZ8 (GLOVE) ×6 IMPLANT
GLOVE BIOGEL M STRL SZ7.5 (GLOVE) IMPLANT
GLOVE BIOGEL PI IND STRL 8 (GLOVE) IMPLANT
GLOVE BIOGEL PI INDICATOR 8 (GLOVE)
GLOVE INDICATOR 8.0 STRL GRN (GLOVE) ×3 IMPLANT
GOWN STRL REUS W/ TWL LRG LVL3 (GOWN DISPOSABLE) ×1 IMPLANT
GOWN STRL REUS W/ TWL XL LVL3 (GOWN DISPOSABLE) ×1 IMPLANT
GOWN STRL REUS W/TWL LRG LVL3 (GOWN DISPOSABLE) ×2
GOWN STRL REUS W/TWL XL LVL3 (GOWN DISPOSABLE) ×2
HEAD ENDO II MOD SZ 48 (Orthopedic Implant) ×3 IMPLANT
HOOD PEEL AWAY FLYTE STAYCOOL (MISCELLANEOUS) ×6 IMPLANT
INSERT TAPER ENDO II -3 (Orthopedic Implant) ×3 IMPLANT
IV NS 100ML SINGLE PACK (IV SOLUTION) IMPLANT
LABEL OR SOLS (LABEL) ×3 IMPLANT
MANIFOLD NEPTUNE II (INSTRUMENTS) ×3 IMPLANT
NDL SAFETY ECLIPSE 18X1.5 (NEEDLE) ×1 IMPLANT
NEEDLE FILTER BLUNT 18X 1/2SAF (NEEDLE) ×2
NEEDLE FILTER BLUNT 18X1 1/2 (NEEDLE) ×1 IMPLANT
NEEDLE HYPO 18GX1.5 SHARP (NEEDLE) ×2
NEEDLE SPNL 20GX3.5 QUINCKE YW (NEEDLE) ×3 IMPLANT
NS IRRIG 1000ML POUR BTL (IV SOLUTION) ×3 IMPLANT
PACK HIP PROSTHESIS (MISCELLANEOUS) ×3 IMPLANT
PRESSURIZER CEMENT PROX FEM SM (MISCELLANEOUS) ×3 IMPLANT
PULSAVAC PLUS IRRIG FAN TIP (DISPOSABLE) ×3
SOL .9 NS 3000ML IRR  AL (IV SOLUTION) ×4
SOL .9 NS 3000ML IRR UROMATIC (IV SOLUTION) ×2 IMPLANT
STAPLER SKIN PROX 35W (STAPLE) ×3 IMPLANT
STEM CENTRALIZER DISTAL SZ10 (Stem) ×3 IMPLANT
STEM HIP FEMORAL 9MMX130MM (Stem) ×3 IMPLANT
STRAP SAFETY 5IN WIDE (MISCELLANEOUS) ×3 IMPLANT
SUT ETHIBOND 2 V 37 (SUTURE) ×3 IMPLANT
SUT VIC AB 1 CT1 36 (SUTURE) IMPLANT
SUT VIC AB 2-0 CT1 (SUTURE) ×6 IMPLANT
SUT VIC AB 2-0 CT1 27 (SUTURE)
SUT VIC AB 2-0 CT1 TAPERPNT 27 (SUTURE) IMPLANT
SUT VICRYL 1-0 27IN ABS (SUTURE) ×6
SUTURE VICRYL 1-0 27IN ABS (SUTURE) ×2 IMPLANT
SYR 10ML LL (SYRINGE) ×3 IMPLANT
SYR 30ML LL (SYRINGE) ×9 IMPLANT
SYR TB 1ML 27GX1/2 LL (SYRINGE) IMPLANT
TAPE TRANSPORE STRL 2 31045 (GAUZE/BANDAGES/DRESSINGS) ×3 IMPLANT
TIP BRUSH PULSAVAC PLUS 24.33 (MISCELLANEOUS) ×3 IMPLANT
TIP FAN IRRIG PULSAVAC PLUS (DISPOSABLE) ×1 IMPLANT
TOWER CARTRIDGE SMART MIX (DISPOSABLE) ×3 IMPLANT

## 2020-11-06 NOTE — ED Notes (Signed)
MD York Cerise at bedside speaking with patient niece who patient lives with.

## 2020-11-06 NOTE — Consult Note (Addendum)
ORTHOPAEDIC CONSULTATION  REQUESTING PHYSICIAN: [name], MD  Chief Complaint:   Right hip pain.  History of Present Illness: [name] [name] is a 86 y.o. female with a history of hypothyroidism and an essential tremor with poor balance who is otherwise in good health.  The patient lives with her niece independently.  Apparently, she ambulates with a cane but refuses to use a Brase despite her poor balance.  The patient was in her usual state of health last evening when she apparently got up to go the bathroom, lost her balance, and fell onto her right hip.  The patient was brought to the emergency room where x-rays demonstrated a displaced right femoral neck fracture.  The patient denies any associated injuries.  She did not strike her head or lose consciousness.  It is difficult to determine if she suffered any lightheadedness, dizziness, shortness of breath, or other symptoms contributing to her fall, given that she is a poor historian.  Past Medical History:  Diagnosis Date  . Essential tremor   . Failure to thrive in adult   . Hyponatremia   . Senile asthenia   . Thyroid disease   . Unsteady gait    Past Surgical History:  Procedure Laterality Date  . JOINT REPLACEMENT     right hip   Social History   Socioeconomic History  . Marital status: Widowed    Spouse name: Not on file  . Number of children: Not on file  . Years of education: Not on file  . Highest education level: Not on file  Occupational History  . Not on file  Tobacco Use  . Smoking status: Never Smoker  . Smokeless tobacco: Never Used  Substance and Sexual Activity  . Alcohol use: Never  . Drug use: Never  . Sexual activity: Not on file  Other Topics Concern  . Not on file  Social History Narrative   ** Merged History Encounter **       Social Determinants of Health   Financial Resource Strain:   . Difficulty of Paying Living  Expenses: Not on file  Food Insecurity:   . Worried About Programme researcher, broadcasting/film/video in the Last Year: Not on file  . Ran Out of Food in the Last Year: Not on file  Transportation Needs:   . Lack of Transportation (Medical): Not on file  . Lack of Transportation (Non-Medical): Not on file  Physical Activity:   . Days of Exercise per Week: Not on file  . Minutes of Exercise per Session: Not on file  Stress:   . Feeling of Stress : Not on file  Social Connections:   . Frequency of Communication with Friends and Family: Not on file  . Frequency of Social Gatherings with Friends and Family: Not on file  . Attends Religious Services: Not on file  . Active Member of Clubs or Organizations: Not on file  . Attends Banker Meetings: Not on file  . Marital Status: Not on file   History reviewed. No pertinent family history. No Known Allergies Prior to Admission medications   Medication Sig Start Date End Date Taking? Authorizing Provider  carbidopa-levodopa (SINEMET IR) 25-100 MG tablet Take 1 tablet by mouth 3 (three) times daily. Take 1 tablet in the morning, 1 tablet in the evening, and 2 tablets at bedtime.   Yes [provider]  cyanocobalamin 100 MCG tablet Take 100 mcg by mouth daily.   Yes [provider]  docusate sodium (COLACE)  100 MG capsule Take 100 mg by mouth every other day as needed for mild constipation.  09/21/20  Yes [provider]  levothyroxine (SYNTHROID) 100 MCG tablet Take 100 mcg by mouth daily before breakfast.   Yes [provider]  LORazepam (ATIVAN) 0.5 MG tablet Take 0.5 mg by mouth every 4 (four) hours as needed. 08/26/20  Yes [provider]  Multiple Vitamins-Minerals (CENTRUM SILVER) CHEW Chew by mouth.   Yes [provider]  primidone (MYSOLINE) 50 MG tablet Take 50 mg by mouth at bedtime.   Yes [provider]   CT HEAD WO CONTRAST  Result Date: 11/06/2020 CLINICAL DATA:  Initial  evaluation for acute head trauma, minor. EXAM: CT HEAD WITHOUT CONTRAST TECHNIQUE: Contiguous axial images were obtained from the base of the skull through the vertex without intravenous contrast. COMPARISON:  None. FINDINGS: Brain: Generalized age-related cerebral atrophy with mild chronic small vessel ischemic disease. No acute intracranial hemorrhage. No acute large vessel territory infarct. Approximate 1 cm right parafalcine meningioma seen at the anterior right frontal falx (series 3, image 12). No associated mass effect or edema. No other mass lesion, mass effect, or midline shift. No hydrocephalus or extra-axial fluid collection. Vascular: No hyperdense vessel. Scattered vascular calcifications noted within the carotid siphons. Skull: Scalp soft tissues demonstrate no acute finding. Calvarium intact. Lucent lesion at the left frontoparietal calvarium noted, indeterminate, but most likely benign. Sinuses/Orbits: Globes orbital soft tissues within normal limits. Mild scattered mucosal thickening noted within the sphenoid ethmoidal sinuses. Paranasal sinuses are otherwise clear. No mastoid effusion. Other: None. IMPRESSION: 1. No acute intracranial abnormality. 2. 1 cm right parafalcine meningioma without associated mass effect or edema. 3. Generalized age-related cerebral atrophy with mild chronic small vessel ischemic disease. Electronically Signed   By: Rise Mu M.D.   On: 11/06/2020 04:23   CT CERVICAL SPINE WO CONTRAST  Result Date: 11/06/2020 CLINICAL DATA:  Initial evaluation for acute trauma, fall. EXAM: CT CERVICAL SPINE WITHOUT CONTRAST TECHNIQUE: Multidetector CT imaging of the cervical spine was performed without intravenous contrast. Multiplanar CT image reconstructions were also generated. COMPARISON:  None available. FINDINGS: Alignment: Straightening of the normal cervical lordosis. Trace anterolisthesis of C7 on T1-T1 on T2, chronic and facet mediated. Skull base and vertebrae:  Skull base intact. Normal C1-2 articulations are preserved in the dens is intact. Vertebral body heights maintained. No acute fracture. Soft tissues and spinal canal: Soft tissues of the neck demonstrate no acute finding. No abnormal prevertebral edema. Spinal canal within normal limits. Prominent vascular calcifications about the carotid bifurcations. Disc levels: Mild cervical spondylosis present at C3-4 through C6-7. Upper chest: Visualized upper chest demonstrates no acute finding. Partially visualized lung apices are clear. Other: None. IMPRESSION: 1. No acute traumatic injury within the cervical spine. 2. Mild cervical spondylosis at C3-4 through C6-7. Electronically Signed   By: Rise Mu M.D.   On: 11/06/2020 04:42   DG Chest Portable 1 View  Result Date: 11/06/2020 CLINICAL DATA:  Preoperative right hip fracture EXAM: PORTABLE CHEST 1 VIEW COMPARISON:  None. FINDINGS: Heart size and pulmonary vascularity are normal. Emphysematous changes in the lungs. No airspace disease or consolidation. No pleural effusions. No pneumothorax. Calcification of the aorta. Mediastinal contours appear intact. IMPRESSION: Emphysematous changes in the lungs. No evidence of active pulmonary disease. Electronically Signed   By: Burman Nieves M.D.   On: 11/06/2020 04:30   DG Hip Unilat With Pelvis 2-3 Views Right  Result Date: 11/06/2020 CLINICAL DATA:  Right  hip pain after fall EXAM: DG HIP (WITH OR WITHOUT PELVIS) 2-3V RIGHT COMPARISON:  None. FINDINGS: Fracture of the right femoral neck with varus angulation. Small bone fragments are projected medially. Can not exclude extension into the lesser trochanter. The pelvis appears intact. Previous left hip hemiarthroplasty. Degenerative changes in the lower lumbar spine. Diffuse bone demineralization. IMPRESSION: Fracture of the right femoral neck with varus angulation. Small medial bone fragments are present. Can not exclude extension into the lesser  trochanter. Electronically Signed   By: Burman Nieves M.D.   On: 11/06/2020 04:29    Positive ROS: All other systems have been reviewed and were otherwise negative with the exception of those mentioned in the HPI and as above.  Physical Exam: General:  Alert, no acute distress Psychiatric:  Patient is not competent for consent  Cardiovascular:  No pedal edema Respiratory:  No wheezing, non-labored breathing GI:  Abdomen is soft and non-tender Skin:  No lesions in the area of chief complaint Neurologic:  Sensation intact distally Lymphatic:  No axillary or cervical lymphadenopathy  Orthopedic Exam:  Orthopedic examination is limited to the right hip and lower extremity.  The right lower extremity is somewhat shortened and externally rotated as compared to the left.  Skin inspection around the right hip is unremarkable.  No swelling, erythema, ecchymosis, abrasions, or other skin abnormalities are identified.  She has mild tenderness palpation of the lateral aspect of the right hip.  She has severe pain with any attempted active or passive motion of the right hip.  She is neurovascularly intact to the right lower extremity and foot, demonstrating the ability to active dorsiflex and plantarflex her toes and ankle.  Sensation is intact to light touch to all distributions.  She has good capillary refill to her right foot.  X-rays:  X-rays of the pelvis and right hip are available for review and have been reviewed by myself.  These films demonstrate a varus displaced fracture of the right femoral neck.  There may also be a nondisplaced fracture of the greater trochanter.  No significant degenerative changes are identified.  No lytic lesions or other acute bony abnormalities are noted.  Assessment: Displaced right femoral neck fracture.  Plan: The treatment options, including both surgical and nonsurgical choices, have been discussed in detail with the patient and her niece, who is at the  bedside and is the patient's power of attorney. The patient and her niece would like to proceed with surgical intervention to include a right hip hemiarthroplasty. The risks (including bleeding, infection, nerve and/or blood vessel injury, persistent or recurrent pain, loosening or failure of the components, leg length inequality, dislocation, need for further surgery, blood clots, strokes, heart attacks or arrhythmias, pneumonia, etc.) and benefits of the surgical procedure were discussed. The patient's niece states her understanding and agrees to proceed.  She agrees to a blood transfusion if necessary. A formal written consent will be obtained by the nursing staff.  Thank you for asking me to participate in the care of this most pleasant yet unfortunate woman.  I will be happy to follow her with you.   Maryagnes Amos, MD  Beeper #:  [phone]  11/06/2020 10:24 AM

## 2020-11-06 NOTE — ED Notes (Signed)
Patient transported to CT with technician on stretcher.  

## 2020-11-06 NOTE — Transfer of Care (Signed)
Immediate Anesthesia Transfer of Care Note  Patient: [name] [name]  Procedure(s) Performed: Procedure(s): ARTHROPLASTY BIPOLAR HIP (HEMIARTHROPLASTY) (Right)  Patient Location: PACU  Anesthesia Type:Spinal  Level of Consciousness: awake, alert  and oriented  Airway & Oxygen Therapy: Patient Spontanous Breathing and Patient connected to face mask oxygen  Post-op Assessment: Report given to RN and Post -op Vital signs reviewed and stable  Post vital signs: Reviewed and stable  Last Vitals:  Vitals:   11/06/20 1027 11/06/20 1307  BP:  (!) 126/39  Pulse: 92 87  Resp: 20 14  Temp: 36.8 C (!) 36.3 C  SpO2: 100% 100%    Complications: No apparent anesthesia complications

## 2020-11-06 NOTE — Anesthesia Procedure Notes (Deleted)
Spinal

## 2020-11-06 NOTE — ED Triage Notes (Signed)
Unwitnessed fall in bathroom. Denies LOC. Presents with externally rotated R leg. EMS reports CSM intact. No meds en route. Pt alert and oriented. Lives alone with caretaker. Denies daily thinners. Reports neck pain, denies h/a.

## 2020-11-06 NOTE — Anesthesia Preprocedure Evaluation (Signed)
Anesthesia Evaluation  Patient identified by MRN, date of birth, ID band Patient awake    Reviewed: Allergy & Precautions, H&P , NPO status , Patient's Chart, lab work & pertinent test results, reviewed documented beta blocker date and time   Airway Mallampati: II   Neck ROM: full    Dental  (+) Poor Dentition   Pulmonary neg pulmonary ROS,    Pulmonary exam normal        Cardiovascular negative cardio ROS Normal cardiovascular exam Rhythm:regular Rate:Normal     Neuro/Psych negative neurological ROS  negative psych ROS   GI/Hepatic negative GI ROS, Neg liver ROS,   Endo/Other  Hypothyroidism   Renal/GU negative Renal ROS  negative genitourinary   Musculoskeletal   Abdominal   Peds  Hematology  (+) Blood dyscrasia, anemia ,   Anesthesia Other Findings Past Medical History: No date: Essential tremor No date: Failure to thrive in adult No date: Hyponatremia No date: Senile asthenia No date: Thyroid disease No date: Unsteady gait Past Surgical History: No date: JOINT REPLACEMENT     Comment:  right hip BMI    Body Mass Index: 16.14 kg/m     Reproductive/Obstetrics negative OB ROS                             Anesthesia Physical Anesthesia Plan  ASA: III and emergent  Anesthesia Plan: Spinal   Post-op Pain Management:    Induction:   PONV Risk Score and Plan:   Airway Management Planned:   Additional Equipment:   Intra-op Plan:   Post-operative Plan:   Informed Consent: I have reviewed the patients History and Physical, chart, labs and discussed the procedure including the risks, benefits and alternatives for the proposed anesthesia with the patient or authorized representative who has indicated his/her understanding and acceptance.     Dental Advisory Given  Plan Discussed with: CRNA  Anesthesia Plan Comments:         Anesthesia Quick Evaluation

## 2020-11-06 NOTE — Anesthesia Procedure Notes (Signed)
Procedure Name: MAC Date/Time: 11/06/2020 10:53 AM Performed by: Lily Peer, Orlie Cundari, CRNA Pre-anesthesia Checklist: Patient identified, Emergency Drugs available, Suction available and Patient being monitored Patient Re-evaluated:Patient Re-evaluated prior to induction Oxygen Delivery Method: Simple face mask

## 2020-11-06 NOTE — Progress Notes (Signed)
[facility]  - [place] [facility]   PATIENT NAME: [name] [name]    MR#:  [id number]  DATE OF BIRTH:  10-10-1918  SUBJECTIVE:  patient came in from home after having a mechanical fall. Nice [name] [name] in the room. Patient currently is sleeping. Patient has advanced dementia at baseline according to the niece REVIEW OF SYSTEMS:   Review of Systems  Unable to perform ROS: Dementia   Tolerating Diet:npo Tolerating PT:   DRUG ALLERGIES:  No Known Allergies  VITALS:  Blood pressure (!) 142/91, pulse (!) 108, temperature 98 F (36.7 C), resp. rate 19, height 5\' 6"  (1.676 m), weight 45.5 kg, SpO2 92 %.  PHYSICAL EXAMINATION:   Physical Exam  GENERAL:  86 y.o.-year-old patient lying in the bed with no acute distress. HEENT: Head atraumatic, normocephalic. Oropharynx and nasopharynx clear.  LUNGS: Normal breath sounds bilaterally, no wheezing, rales, rhonchi. No use of accessory muscles of respiration.  CARDIOVASCULAR: S1, S2 normal. No murmurs, rubs, or gallops.  ABDOMEN: Soft, nontender, nondistended. Bowel sounds present. No organomegaly or mass.  EXTREMITIES: No cyanosis, clubbing or edema b/l.    NEUROLOGIC: unable to test--pt fast asleep PSYCHIATRIC:  patient is sleeping SKIN: bruise over the chest/back from fall  LABORATORY PANEL:  CBC Recent Labs  Lab 11/06/20 0347  WBC 6.8  HGB 11.8*  HCT 36.0  PLT 243    Chemistries  Recent Labs  Lab 11/06/20 0347  NA 134*  K 4.0  CL 99  CO2 27  GLUCOSE 97  BUN 25*  CREATININE 0.74  CALCIUM 8.8*  AST 30  ALT <5  ALKPHOS 97  BILITOT 0.5   Cardiac Enzymes No results for input(s): TROPONINI in the last 168 hours. RADIOLOGY:  CT HEAD WO CONTRAST  Result Date: 11/06/2020 CLINICAL DATA:  Initial evaluation for acute head trauma, minor. EXAM: CT HEAD WITHOUT CONTRAST TECHNIQUE: Contiguous axial images were obtained from the base of the skull through the vertex without intravenous contrast.  COMPARISON:  None. FINDINGS: Brain: Generalized age-related cerebral atrophy with mild chronic small vessel ischemic disease. No acute intracranial hemorrhage. No acute large vessel territory infarct. Approximate 1 cm right parafalcine meningioma seen at the anterior right frontal falx (series 3, image 12). No associated mass effect or edema. No other mass lesion, mass effect, or midline shift. No hydrocephalus or extra-axial fluid collection. Vascular: No hyperdense vessel. Scattered vascular calcifications noted within the carotid siphons. Skull: Scalp soft tissues demonstrate no acute finding. Calvarium intact. Lucent lesion at the left frontoparietal calvarium noted, indeterminate, but most likely benign. Sinuses/Orbits: Globes orbital soft tissues within normal limits. Mild scattered mucosal thickening noted within the sphenoid ethmoidal sinuses. Paranasal sinuses are otherwise clear. No mastoid effusion. Other: None. IMPRESSION: 1. No acute intracranial abnormality. 2. 1 cm right parafalcine meningioma without associated mass effect or edema. 3. Generalized age-related cerebral atrophy with mild chronic small vessel ischemic disease. Electronically Signed   By: 11/08/2020 M.D.   On: 11/06/2020 04:23   CT CERVICAL SPINE WO CONTRAST  Result Date: 11/06/2020 CLINICAL DATA:  Initial evaluation for acute trauma, fall. EXAM: CT CERVICAL SPINE WITHOUT CONTRAST TECHNIQUE: Multidetector CT imaging of the cervical spine was performed without intravenous contrast. Multiplanar CT image reconstructions were also generated. COMPARISON:  None available. FINDINGS: Alignment: Straightening of the normal cervical lordosis. Trace anterolisthesis of C7 on T1-T1 on T2, chronic and facet mediated. Skull base and vertebrae: Skull base intact. Normal C1-2 articulations are preserved in the dens is intact.  Vertebral body heights maintained. No acute fracture. Soft tissues and spinal canal: Soft tissues of the neck  demonstrate no acute finding. No abnormal prevertebral edema. Spinal canal within normal limits. Prominent vascular calcifications about the carotid bifurcations. Disc levels: Mild cervical spondylosis present at C3-4 through C6-7. Upper chest: Visualized upper chest demonstrates no acute finding. Partially visualized lung apices are clear. Other: None. IMPRESSION: 1. No acute traumatic injury within the cervical spine. 2. Mild cervical spondylosis at C3-4 through C6-7. Electronically Signed   By: Rise Mu M.D.   On: 11/06/2020 04:42   DG Chest Portable 1 View  Result Date: 11/06/2020 CLINICAL DATA:  Preoperative right hip fracture EXAM: PORTABLE CHEST 1 VIEW COMPARISON:  None. FINDINGS: Heart size and pulmonary vascularity are normal. Emphysematous changes in the lungs. No airspace disease or consolidation. No pleural effusions. No pneumothorax. Calcification of the aorta. Mediastinal contours appear intact. IMPRESSION: Emphysematous changes in the lungs. No evidence of active pulmonary disease. Electronically Signed   By: Burman Nieves M.D.   On: 11/06/2020 04:30   DG HIP UNILAT W OR W/O PELVIS 2-3 VIEWS RIGHT  Result Date: 11/06/2020 CLINICAL DATA:  Right hip replacement. EXAM: DG HIP (WITH OR WITHOUT PELVIS) 2-3V RIGHT COMPARISON:  Right hip x-rays from same day. FINDINGS: The right hip demonstrates a hemiarthroplasty without evidence of hardware failure or complication. There is no fracture or dislocation. The alignment is anatomic. Post-surgical changes and subcutaneous emphysema noted in the surrounding soft tissues. Prior left hip hemiarthroplasty. IMPRESSION: 1. Interval right hip hemiarthroplasty without acute postoperative complication. Electronically Signed   By: Obie Dredge M.D.   On: 11/06/2020 13:56   DG Hip Unilat With Pelvis 2-3 Views Right  Result Date: 11/06/2020 CLINICAL DATA:  Right hip pain after fall EXAM: DG HIP (WITH OR WITHOUT PELVIS) 2-3V RIGHT COMPARISON:   None. FINDINGS: Fracture of the right femoral neck with varus angulation. Small bone fragments are projected medially. Can not exclude extension into the lesser trochanter. The pelvis appears intact. Previous left hip hemiarthroplasty. Degenerative changes in the lower lumbar spine. Diffuse bone demineralization. IMPRESSION: Fracture of the right femoral neck with varus angulation. Small medial bone fragments are present. Can not exclude extension into the lesser trochanter. Electronically Signed   By: Burman Nieves M.D.   On: 11/06/2020 04:29   ASSESSMENT AND PLAN:   86 year old female with history of essential tremor, hypothyroidism, presenting with right hip fracture from an accidental fall    Closed displaced fracture of right femoral neck (HCC)   Accidental fall at home -N.p.o. for surgery -Pain management, IV fluids -orthopedic consultation with Dr. Joice Lofts-- thanks for surgery this afternoon. -Patient at low risk for perioperative cardiopulmonary complications.  Main risk factors advanced age    Essential tremor -Continue carbidopa   Dementia -Appears stable without behavioral changes    Anemia -Hemoglobin 11.8, down from 12.4 a couple months -Continue to monitor    Hypothyroidism -Continue levothyroxine  Underweight, suspect moderate protein calorie malnutrition -BMI 16.14 -Nutritionist evaluation    DVT prophylaxis: SCDs  code Status: DNR Family Communication: Niece at bedside.  Plan of care explained.  Confirms patient is DNR Disposition Plan: Back to previous home environment Consults called: Dr. Joice Lofts Status: Inpatient for inpatient only procedure   Dispo: The patient is from: Home              Anticipated d/c is to: TBD              Anticipated d/c date  is: 2 days              Patient currently is not medically stable to d/c.  Patient admitted with mechanical hip fracture. She will be undergoing surgery today. TOC for discharge planning. PT OT  evaluation tomorrow.      TOTAL TIME TAKING CARE OF THIS PATIENT: 25 minutes.  >50% time spent on counselling and coordination of care  Note: This dictation was prepared with Dragon dictation along with smaller phrase technology. Any transcriptional errors that result from this process are unintentional.  [name] M.D    [facility]   CC: Primary care physician; Johnna Acosta, NP (Inactive)Patient ID: [name] [name], female   DOB: Sep 20, 1918, 86 y.o.   MRN: [id number]

## 2020-11-06 NOTE — Anesthesia Procedure Notes (Signed)
Spinal  Patient location during procedure: OR Start time: 11/06/2020 10:50 AM End time: 11/06/2020 10:56 AM Staffing Performed: resident/CRNA  Anesthesiologist: Molli Barrows, MD Resident/CRNA: Norm Salt, CRNA Preanesthetic Checklist Completed: patient identified, IV checked, site marked, risks and benefits discussed, surgical consent, monitors and equipment checked, pre-op evaluation and timeout performed Spinal Block Patient position: sitting Prep: DuraPrep Patient monitoring: heart rate, cardiac monitor, continuous pulse ox and blood pressure Approach: midline Location: L3-4 Injection technique: single-shot Needle Needle type: Sprotte  Needle gauge: 24 G Needle length: 9 cm Assessment Sensory level: T4 Additional Notes IV functioning, monitors applied to pt. Expiration date of kit checked and confirmed to be in date. Sterile prep and drape, hand hygiene and sterile gloved used. Pt was positioned and spine was prepped in sterile fashion. Skin was anesthetized with lidocaine. Free flow of clear CSF obtained prior to injecting local anesthetic into CSF x 1 attempt. Spinal needle aspirated freely following injection. Needle was carefully withdrawn, and pt tolerated procedure well. Loss of motor and sensory on exam post injection.

## 2020-11-06 NOTE — ED Notes (Signed)
Patient caregiver states patient's last oral intake 2000 with 8 oz of water.

## 2020-11-06 NOTE — ED Provider Notes (Addendum)
[facility] Emergency Department Provider Note  ____________________________________________   First MD Initiated Contact with Patient 11/06/20 [id number]     (approximate)  I have reviewed the triage vital signs and the nursing notes.   HISTORY  Chief Complaint Fall  Level 5 caveat: The patient's history is somewhat limited by her being hard of hearing but she is alert and oriented.  HPI [name] [name] is a 86 y.o. female with medical history as listed below who reportedly lives at home with a caregiver.  She presents tonight after an unwitnessed fall in the bathroom.  Her caregiver found her after the patient went to the bathroom and then fell onto her back.  She is reporting severe sharp pain in her right hip that is much worse with any amount of movement.  The right lower extremity is externally rotated but not noticeably shortened.  The patient denies loss of consciousness, headache, chest pain, shortness of breath, nausea, and vomiting.  She denies any injuries to her arms.  She reports some neck pain although she cannot localize it.  She reports having had surgery on her right hip in the past.  She has no numbness nor tingling.         Past Medical History:  Diagnosis Date  . Essential tremor   . Failure to thrive in adult   . Hyponatremia   . Senile asthenia   . Thyroid disease   . Unsteady gait     Patient Active Problem List   Diagnosis Date Noted  . Closed displaced fracture of right femoral neck (HCC) 11/06/2020  . Accidental fall 11/06/2020  . Preoperative clearance 11/06/2020  . Anemia 11/06/2020  . Closed right hip fracture, initial encounter (HCC) 11/06/2020  . Hypothyroidism 11/06/2020  . Underweight 11/06/2020  . Essential tremor   . Failure to thrive in adult   . Hyponatremia   . Unsteady gait     Past Surgical History:  Procedure Laterality Date  . JOINT REPLACEMENT     right hip    Prior to Admission medications     Medication Sig Start Date End Date Taking? Authorizing Provider  carbidopa-levodopa (SINEMET IR) 25-100 MG tablet Take 1 tablet by mouth 3 (three) times daily. Take 1 tablet in the morning, 1 tablet in the evening, and 2 tablets at bedtime.   Yes [provider]  cyanocobalamin 100 MCG tablet Take 100 mcg by mouth daily.   Yes [provider]  docusate sodium (COLACE) 100 MG capsule Take 100 mg by mouth every other day as needed for mild constipation.  09/21/20  Yes [provider]  levothyroxine (SYNTHROID) 100 MCG tablet Take 100 mcg by mouth daily before breakfast.   Yes [provider]  LORazepam (ATIVAN) 0.5 MG tablet Take 0.5 mg by mouth every 4 (four) hours as needed. 08/26/20  Yes [provider]  Multiple Vitamins-Minerals (CENTRUM SILVER) CHEW Chew by mouth.   Yes [provider]  primidone (MYSOLINE) 50 MG tablet Take 50 mg by mouth at bedtime.   Yes [provider]    Allergies Patient has no known allergies.  History reviewed. No pertinent family history.  Social History Social History   Tobacco Use  . Smoking status: Never Smoker  . Smokeless tobacco: Never Used  Substance Use Topics  . Alcohol use: Never  . Drug use: Never    Review of Systems Level 5 caveat: The patient's history is somewhat limited by her being hard of hearing  but she is alert and oriented.  Constitutional: No fever/chills Eyes: No visual changes. ENT: No sore throat. Cardiovascular: Denies chest pain. Respiratory: Denies shortness of breath. Gastrointestinal: No abdominal pain.  No nausea, no vomiting.  No diarrhea.  No constipation. Genitourinary: Negative for dysuria. Musculoskeletal: Severe right hip pain with external rotation.  Neck pain as well. Integumentary: Negative for rash. Neurological: Negative for headaches, focal weakness or numbness.   ____________________________________________   PHYSICAL EXAM:  ED Triage  Vitals  Enc Vitals Group     BP 11/06/20 0342 (!) 156/84     Pulse Rate 11/06/20 0342 82     Resp 11/06/20 0342 18     Temp 11/06/20 0342 (!) 97.4 F (36.3 C)     Temp Source 11/06/20 0342 Axillary     SpO2 11/06/20 0342 97 %     Weight 11/06/20 0338 45.4 kg (100 lb)     Height 11/06/20 0338 1.676 m (5\' 6" )     Head Circumference --      Peak Flow --      Pain Score 11/06/20 0338 9     Pain Loc --      Pain Edu? --      Excl. in GC? --      Constitutional: Alert and oriented.  Patient is in moderate to severe pain. Eyes: Conjunctivae are normal.  Head: Atraumatic. Nose: No congestion/rhinnorhea. Mouth/Throat: Patient is wearing a mask. Neck: No stridor.  No meningeal signs.   Cardiovascular: Normal rate, regular rhythm. Good peripheral circulation. Grossly normal heart sounds. Respiratory: Normal respiratory effort.  No retractions. Gastrointestinal: Soft and nontender. No distention.  Musculoskeletal: Right lower extremity is externally rotated and there is severe pain in her proximal femur with any amount of range of motion of the extremity.  I see no evidence of acute injury to her left leg nor either of her arms and she has normal range of motion of her elbows and shoulders.  She has some tenderness to palpation of her neck but I cannot identify a specific location or any point tenderness to the cervical spine.  She is rotating her head slightly side to side but is trying to minimize her own movements to avoid additional pain to her hip. Neurologic:  Normal speech and language. No gross focal neurologic deficits are appreciated.  Skin:  Skin is warm, dry and intact.   ____________________________________________   LABS (all labs ordered are listed, but only abnormal results are displayed)  Labs Reviewed  COMPREHENSIVE METABOLIC PANEL - Abnormal; Notable for the following components:      Result Value   Sodium 134 (*)    BUN 25 (*)    Calcium 8.8 (*)    Total Protein  6.3 (*)    Albumin 3.4 (*)    All other components within normal limits  CBC WITH DIFFERENTIAL/PLATELET - Abnormal; Notable for the following components:   RBC 3.74 (*)    Hemoglobin 11.8 (*)    Abs Immature Granulocytes 0.10 (*)    All other components within normal limits  URINE CULTURE  RESP PANEL BY RT-PCR (FLU A&B, COVID) ARPGX2  APTT  PROTIME-INR  URINALYSIS, ROUTINE W REFLEX MICROSCOPIC  TYPE AND SCREEN   ____________________________________________  EKG  ED ECG REPORT I, Loleta Roseory Seth Higginbotham, the attending physician, personally viewed and interpreted this ECG.  Date: 11/06/2020 EKG Time: 4:50 AM Rate: 79 Rhythm: normal sinus rhythm QRS Axis: normal Intervals: normal ST/T Wave abnormalities: Non-specific ST segment / T-wave changes,  but no clear evidence of acute ischemia. Narrative Interpretation: no definitive evidence of acute ischemia; does not meet STEMI criteria.   ____________________________________________  RADIOLOGY I, Loleta Rose, personally viewed and evaluated these images (plain radiographs) as part of my medical decision making, as well as reviewing the written report by the radiologist.  ED MD interpretation: No acute abnormality identified on head CT nor cervical spine CT.  The patient has a meningioma but without any associated mass-effect or edema.  I personally reviewed the patient's imaging and agree with the radiologist's interpretation that the patient has a right femoral neck fracture on her hip/pelvis x-rays.  There is no evidence of emergent finding on chest x-ray.  Official radiology report(s): CT HEAD WO CONTRAST  Result Date: 11/06/2020 CLINICAL DATA:  Initial evaluation for acute head trauma, minor. EXAM: CT HEAD WITHOUT CONTRAST TECHNIQUE: Contiguous axial images were obtained from the base of the skull through the vertex without intravenous contrast. COMPARISON:  None. FINDINGS: Brain: Generalized age-related cerebral atrophy with mild  chronic small vessel ischemic disease. No acute intracranial hemorrhage. No acute large vessel territory infarct. Approximate 1 cm right parafalcine meningioma seen at the anterior right frontal falx (series 3, image 12). No associated mass effect or edema. No other mass lesion, mass effect, or midline shift. No hydrocephalus or extra-axial fluid collection. Vascular: No hyperdense vessel. Scattered vascular calcifications noted within the carotid siphons. Skull: Scalp soft tissues demonstrate no acute finding. Calvarium intact. Lucent lesion at the left frontoparietal calvarium noted, indeterminate, but most likely benign. Sinuses/Orbits: Globes orbital soft tissues within normal limits. Mild scattered mucosal thickening noted within the sphenoid ethmoidal sinuses. Paranasal sinuses are otherwise clear. No mastoid effusion. Other: None. IMPRESSION: 1. No acute intracranial abnormality. 2. 1 cm right parafalcine meningioma without associated mass effect or edema. 3. Generalized age-related cerebral atrophy with mild chronic small vessel ischemic disease. Electronically Signed   By: Rise Mu M.D.   On: 11/06/2020 04:23   CT CERVICAL SPINE WO CONTRAST  Result Date: 11/06/2020 CLINICAL DATA:  Initial evaluation for acute trauma, fall. EXAM: CT CERVICAL SPINE WITHOUT CONTRAST TECHNIQUE: Multidetector CT imaging of the cervical spine was performed without intravenous contrast. Multiplanar CT image reconstructions were also generated. COMPARISON:  None available. FINDINGS: Alignment: Straightening of the normal cervical lordosis. Trace anterolisthesis of C7 on T1-T1 on T2, chronic and facet mediated. Skull base and vertebrae: Skull base intact. Normal C1-2 articulations are preserved in the dens is intact. Vertebral body heights maintained. No acute fracture. Soft tissues and spinal canal: Soft tissues of the neck demonstrate no acute finding. No abnormal prevertebral edema. Spinal canal within normal  limits. Prominent vascular calcifications about the carotid bifurcations. Disc levels: Mild cervical spondylosis present at C3-4 through C6-7. Upper chest: Visualized upper chest demonstrates no acute finding. Partially visualized lung apices are clear. Other: None. IMPRESSION: 1. No acute traumatic injury within the cervical spine. 2. Mild cervical spondylosis at C3-4 through C6-7. Electronically Signed   By: Rise Mu M.D.   On: 11/06/2020 04:42   DG Chest Portable 1 View  Result Date: 11/06/2020 CLINICAL DATA:  Preoperative right hip fracture EXAM: PORTABLE CHEST 1 VIEW COMPARISON:  None. FINDINGS: Heart size and pulmonary vascularity are normal. Emphysematous changes in the lungs. No airspace disease or consolidation. No pleural effusions. No pneumothorax. Calcification of the aorta. Mediastinal contours appear intact. IMPRESSION: Emphysematous changes in the lungs. No evidence of active pulmonary disease. Electronically Signed   By: Burman Nieves M.D.   On:  11/06/2020 04:30   DG Hip Unilat With Pelvis 2-3 Views Right  Result Date: 11/06/2020 CLINICAL DATA:  Right hip pain after fall EXAM: DG HIP (WITH OR WITHOUT PELVIS) 2-3V RIGHT COMPARISON:  None. FINDINGS: Fracture of the right femoral neck with varus angulation. Small bone fragments are projected medially. Can not exclude extension into the lesser trochanter. The pelvis appears intact. Previous left hip hemiarthroplasty. Degenerative changes in the lower lumbar spine. Diffuse bone demineralization. IMPRESSION: Fracture of the right femoral neck with varus angulation. Small medial bone fragments are present. Can not exclude extension into the lesser trochanter. Electronically Signed   By: Burman Nieves M.D.   On: 11/06/2020 04:29    ____________________________________________   PROCEDURES   Procedure(s) performed (including Critical Care):  Procedures   ____________________________________________   INITIAL  IMPRESSION / MDM / ASSESSMENT AND PLAN / ED COURSE  As part of my medical decision making, I reviewed the following data within the electronic MEDICAL RECORD NUMBER History obtained from family, Nursing notes reviewed and incorporated, Labs reviewed , EKG interpreted , Old chart reviewed, Radiograph reviewed , Discussed with admitting physician (Dr. Para March), Discussed with orthopedic surgeon (Dr. Joice Lofts) and reviewed Notes from prior ED visits   Differential diagnosis includes, but is not limited to, hip fracture, hip dislocation, pelvic fracture, nonspecific muscle strain or contusion.  There is also possibility of intracranial injury or cervical spine injury since she is reporting neck pain although I see no sign of trauma externally.  Given her age and degree of fragility, I ordered CT head and CT cervical spine to rule out potentially life-threatening injuries.  I ordered radiographs of the hip and pelvis.  Morphine 4 mg IV for severe pain and Zofran 4 g IV to try and avoid nausea/vomiting.  Standard anticipated hip fracture lab work is pending.  COVID-19 swab is pending.  The patient is on the cardiac monitor to evaluate for evidence of arrhythmia and/or significant heart rate changes.     Clinical Course as of Nov 06 541  Caleen Essex Nov 06, 2020  1610 The patient's niece showed up and confirmed that she is the patient's power of attorney and caregiver at home.  They have been trying to get her established with a primary care provider in this area for about a year and have been unable to do so because they cannot find a physician who is willing to take on a new patient who is 86 years old.  As a result I have not been able to establish placement in a facility.The caregiver confirms the medical history as previously listed and states that the patient also had a fall last week but refused transport.  She has a bruise on her right flank as result of the fall and she states that the patient has been more  unsteady since the fall last week but she did not strike her head and has not been complaining of headache or neck pain since then.The caregiver also confirms prior left hip replacement and is willing and interested to discuss orthopedic surgery if it would be palliative and confirmed that the patient is still ambulatory and active in spite of her age.   [CF]  0440 Right femoral neck fracture.  Confirmed prior left hemiarthroplasty.  Paging Dr. Joice Lofts with orthopedics  DG Hip Unilat With Pelvis 2-3 Views Right [CF]  0441 No acute abnormality on head CT.    CT HEAD WO CONTRAST [CF]  [id number] I discussed the case with Dr. Joice Lofts with  orthopedic surgery.  He agrees that the patient would benefit from surgery and would like to do the surgery later this morning.  I have consulted the hospitalist for admission.  I updated the patient and her power of attorney who is at the bedside and they agree with the plan for surgery.   [CF]  0502 Per niece/caregiver, patient has been NPO since 20:00 (nine hours at the time of this note).  I ordered 100 mL/hr NS infusion.   [CF]  3825 Spoke with Dr. Para March with the hospitalist service who will admit.   [CF]  [phone] Of note, the patient's caregiver reported that the patient does have an DNR order in place and she has a copy of that with her.  However they do want to briefly rescind the DNR to allow for the surgery.   [CF]    Clinical Course User Index [CF] Loleta Rose, MD     ____________________________________________  FINAL CLINICAL IMPRESSION(S) / ED DIAGNOSES  Final diagnoses:  Closed fracture of neck of right femur, initial encounter (HCC)  Fall in home, initial encounter  Essential tremor  Hypothyroidism, unspecified type     MEDICATIONS GIVEN DURING THIS VISIT:  Medications  0.9 %  sodium chloride infusion ( Intravenous Rate/Dose Change 11/06/20 0517)  ceFAZolin (ANCEF) IVPB 2g/100 mL premix (has no administration in time range)    HYDROcodone-acetaminophen (NORCO/VICODIN) 5-325 MG per tablet 1-2 tablet (has no administration in time range)  morphine 2 MG/ML injection 0.5 mg (has no administration in time range)  0.9 %  sodium chloride infusion ( Intravenous Not Given 11/06/20 0517)  morphine 4 MG/ML injection 4 mg (4 mg Intravenous Given 11/06/20 0455)  ondansetron (ZOFRAN) injection 4 mg (4 mg Intravenous Given 11/06/20 0353)     ED Discharge Orders    None      *Please note:  Masiah Lewing was evaluated in Emergency Department on 11/06/2020 for the symptoms described in the history of present illness. She was evaluated in the context of the global COVID-19 pandemic, which necessitated consideration that the patient might be at risk for infection with the SARS-CoV-2 virus that causes COVID-19. Institutional protocols and algorithms that pertain to the evaluation of patients at risk for COVID-19 are in a state of rapid change based on information released by regulatory bodies including the CDC and federal and state organizations. These policies and algorithms were followed during the patient's care in the ED.  Some ED evaluations and interventions may be delayed as a result of limited staffing during and after the pandemic.*  Note:  This document was prepared using Dragon voice recognition software and may include unintentional dictation errors.   Loleta Rose, MD 11/06/20 [id number]    Loleta Rose, MD 11/06/20 [phone]

## 2020-11-06 NOTE — H&P (Signed)
History and Physical    Shaniece Bussa DJM:[id number] DOB: [birth year] DOA: 11/06/2020  PCP: Johnna Acosta, NP (Inactive)   Patient coming from: Home  I have personally briefly reviewed patient's old medical records in [facility]  Chief Complaint: Fall, right hip pain  HPI: Lasonja Lakins is a 86 y.o. female with medical history significant for essential tremor on carbidopa, cognitive dysfunction, hypothyroidism, and hearing impairment who lived independently until 2 years ago when she started declining and niece moved in with her for around-the-clock care, who was brought to the emergency room following an unwitnessed fall in the bathroom onto her back with acute right hip pain.  There was no loss of consciousness.  Patient unable to contribute to history.  Niece at bedside states that she was previously in her usual state of health and denies preceding illness or complaints of lightheadedness, weakness or visual disturbance, chest pain, palpitations or shortness of breath.  ED Course: On arrival, she was awake and alert, afebrile heart rate 82, BP 156/84 with O2 sat 97% on room air. EKG as reviewed by me : Normal sinus rhythm at 77 with no acute ST-T wave changes.  X-ray right hip showed right femoral neck fracture with varus angulation Chest x-ray: Emphysematous changes C-spine: No acute traumatic injury : CT head no acute intracranial injury The emergency room provider spoke with orthopedist, Dr. Joice Lofts will take patient to the OR in the a.m.  Hospitalist consulted for admission.  Review of Systems: Unable to obtain due to cognitive impairment   Past Medical History:  Diagnosis Date  . Essential tremor   . Failure to thrive in adult   . Hyponatremia   . Senile asthenia   . Thyroid disease   . Unsteady gait     Past Surgical History:  Procedure Laterality Date  . JOINT REPLACEMENT     right hip     reports that she has never smoked. She has never used smokeless  tobacco. She reports that she does not drink alcohol and does not use drugs.  No Known Allergies  History reviewed. No pertinent family history.    Prior to Admission medications   Medication Sig Start Date End Date Taking? Authorizing Provider  carbidopa-levodopa (SINEMET IR) 25-100 MG tablet Take 1 tablet by mouth 3 (three) times daily.    [provider]  cyanocobalamin 100 MCG tablet Take 100 mcg by mouth daily.    [provider]  cyanocobalamin 100 MCG tablet Take 100 mcg by mouth daily. 08/22/20   [provider]  docusate sodium (COLACE) 100 MG capsule Take 100 mg by mouth daily as needed. 09/21/20   [provider]  levothyroxine (SYNTHROID) 100 MCG tablet Take 100 mcg by mouth daily before breakfast.    [provider]  LORazepam (ATIVAN) 0.5 MG tablet Take 0.5 mg by mouth every 4 (four) hours as needed. 08/26/20   [provider]  Multiple Vitamins-Minerals (CENTRUM SILVER) CHEW Chew by mouth.    [provider]  primidone (MYSOLINE) 50 MG tablet Take 50 mg by mouth at bedtime.    [provider]    Physical Exam: Vitals:   11/06/20 0338 11/06/20 0342  BP:  (!) 156/84  Pulse:  82  Resp:  18  Temp:  (!) 97.4 F (36.3 C)  TempSrc:  Axillary  SpO2:  97%  Weight: 45.4 kg   Height: 5\' 6"  (1.676 m)      Vitals:   11/06/20 11/08/20 11/06/20 0342  BP:  (!) 156/84  Pulse:  82  Resp:  18  Temp:  (!) 97.4 F (36.3 C)  TempSrc:  Axillary  SpO2:  97%  Weight: 45.4 kg   Height: 5\' 6"  (1.676 m)       Constitutional: Alert and oriented x 1 .  In pain discomfort HEENT:      Head: Normocephalic and atraumatic.         Eyes: PERLA, EOMI, Conjunctivae are normal. Sclera is non-icteric.       Mouth/Throat: Mucous membranes are moist.       Neck: Supple with no signs of meningismus. Cardiovascular: Regular rate and rhythm. No murmurs, gallops, or rubs. 2+ symmetrical distal pulses are present . No JVD. No LE  edema Respiratory: Respiratory effort normal .Lungs sounds clear bilaterally. No wheezes, crackles, or rhonchi.  Gastrointestinal: Soft, non tender, and non distended with positive bowel sounds. No rebound or guarding. Genitourinary: No CVA tenderness. Musculoskeletal:  Right leg shortening and external rotation.. No cyanosis, or erythema of extremities. Neurologic:  Face is symmetric. Moving all extremities. No gross focal neurologic deficits . Skin: Skin is warm, dry.  No rash or ulcers Psychiatric: Mood and affect are normal and appropriate   Labs on Admission: I have personally reviewed following labs and imaging studies  CBC: Recent Labs  Lab 11/06/20 0347  WBC 6.8  NEUTROABS 3.6  HGB 11.8*  HCT 36.0  MCV 96.3  PLT 243   Basic Metabolic Panel: Recent Labs  Lab 11/06/20 0347  NA 134*  K 4.0  CL 99  CO2 27  GLUCOSE 97  BUN 25*  CREATININE 0.74  CALCIUM 8.8*   GFR: Estimated Creatinine Clearance: 26.8 mL/min (by C-G formula based on SCr of 0.74 mg/dL). Liver Function Tests: Recent Labs  Lab 11/06/20 0347  AST 30  ALT <5  ALKPHOS 97  BILITOT 0.5  PROT 6.3*  ALBUMIN 3.4*   No results for input(s): LIPASE, AMYLASE in the last 168 hours. No results for input(s): AMMONIA in the last 168 hours. Coagulation Profile: Recent Labs  Lab 11/06/20 0347  INR 1.0   Cardiac Enzymes: No results for input(s): CKTOTAL, CKMB, CKMBINDEX, TROPONINI in the last 168 hours. BNP (last 3 results) No results for input(s): PROBNP in the last 8760 hours. HbA1C: No results for input(s): HGBA1C in the last 72 hours. CBG: No results for input(s): GLUCAP in the last 168 hours. Lipid Profile: No results for input(s): CHOL, HDL, LDLCALC, TRIG, CHOLHDL, LDLDIRECT in the last 72 hours. Thyroid Function Tests: No results for input(s): TSH, T4TOTAL, FREET4, T3FREE, THYROIDAB in the last 72 hours. Anemia Panel: No results for input(s): VITAMINB12, FOLATE, FERRITIN, TIBC, IRON,  RETICCTPCT in the last 72 hours. Urine analysis:    Component Value Date/Time   COLORURINE YELLOW (A) 06/12/2020 1350   APPEARANCEUR CLOUDY (A) 06/12/2020 1350   LABSPEC 1.016 06/12/2020 1350   PHURINE 6.0 06/12/2020 1350   GLUCOSEU NEGATIVE 06/12/2020 1350   HGBUR NEGATIVE 06/12/2020 1350   BILIRUBINUR NEGATIVE 06/12/2020 1350   KETONESUR NEGATIVE 06/12/2020 1350   PROTEINUR NEGATIVE 06/12/2020 1350   NITRITE NEGATIVE 06/12/2020 1350   LEUKOCYTESUR NEGATIVE 06/12/2020 1350    Radiological Exams on Admission: CT HEAD WO CONTRAST  Result Date: 11/06/2020 CLINICAL DATA:  Initial evaluation for acute head trauma, minor. EXAM: CT HEAD WITHOUT CONTRAST TECHNIQUE: Contiguous axial images were obtained from the base of the skull through the vertex without intravenous contrast. COMPARISON:  None. FINDINGS: Brain: Generalized age-related cerebral atrophy  with mild chronic small vessel ischemic disease. No acute intracranial hemorrhage. No acute large vessel territory infarct. Approximate 1 cm right parafalcine meningioma seen at the anterior right frontal falx (series 3, image 12). No associated mass effect or edema. No other mass lesion, mass effect, or midline shift. No hydrocephalus or extra-axial fluid collection. Vascular: No hyperdense vessel. Scattered vascular calcifications noted within the carotid siphons. Skull: Scalp soft tissues demonstrate no acute finding. Calvarium intact. Lucent lesion at the left frontoparietal calvarium noted, indeterminate, but most likely benign. Sinuses/Orbits: Globes orbital soft tissues within normal limits. Mild scattered mucosal thickening noted within the sphenoid ethmoidal sinuses. Paranasal sinuses are otherwise clear. No mastoid effusion. Other: None. IMPRESSION: 1. No acute intracranial abnormality. 2. 1 cm right parafalcine meningioma without associated mass effect or edema. 3. Generalized age-related cerebral atrophy with mild chronic small vessel  ischemic disease. Electronically Signed   By: Rise MuBenjamin  McClintock M.D.   On: 11/06/2020 04:23   CT CERVICAL SPINE WO CONTRAST  Result Date: 11/06/2020 CLINICAL DATA:  Initial evaluation for acute trauma, fall. EXAM: CT CERVICAL SPINE WITHOUT CONTRAST TECHNIQUE: Multidetector CT imaging of the cervical spine was performed without intravenous contrast. Multiplanar CT image reconstructions were also generated. COMPARISON:  None available. FINDINGS: Alignment: Straightening of the normal cervical lordosis. Trace anterolisthesis of C7 on T1-T1 on T2, chronic and facet mediated. Skull base and vertebrae: Skull base intact. Normal C1-2 articulations are preserved in the dens is intact. Vertebral body heights maintained. No acute fracture. Soft tissues and spinal canal: Soft tissues of the neck demonstrate no acute finding. No abnormal prevertebral edema. Spinal canal within normal limits. Prominent vascular calcifications about the carotid bifurcations. Disc levels: Mild cervical spondylosis present at C3-4 through C6-7. Upper chest: Visualized upper chest demonstrates no acute finding. Partially visualized lung apices are clear. Other: None. IMPRESSION: 1. No acute traumatic injury within the cervical spine. 2. Mild cervical spondylosis at C3-4 through C6-7. Electronically Signed   By: Rise MuBenjamin  McClintock M.D.   On: 11/06/2020 04:42   DG Chest Portable 1 View  Result Date: 11/06/2020 CLINICAL DATA:  Preoperative right hip fracture EXAM: PORTABLE CHEST 1 VIEW COMPARISON:  None. FINDINGS: Heart size and pulmonary vascularity are normal. Emphysematous changes in the lungs. No airspace disease or consolidation. No pleural effusions. No pneumothorax. Calcification of the aorta. Mediastinal contours appear intact. IMPRESSION: Emphysematous changes in the lungs. No evidence of active pulmonary disease. Electronically Signed   By: Burman NievesWilliam  Stevens M.D.   On: 11/06/2020 04:30   DG Hip Unilat With Pelvis 2-3 Views  Right  Result Date: 11/06/2020 CLINICAL DATA:  Right hip pain after fall EXAM: DG HIP (WITH OR WITHOUT PELVIS) 2-3V RIGHT COMPARISON:  None. FINDINGS: Fracture of the right femoral neck with varus angulation. Small bone fragments are projected medially. Can not exclude extension into the lesser trochanter. The pelvis appears intact. Previous left hip hemiarthroplasty. Degenerative changes in the lower lumbar spine. Diffuse bone demineralization. IMPRESSION: Fracture of the right femoral neck with varus angulation. Small medial bone fragments are present. Can not exclude extension into the lesser trochanter. Electronically Signed   By: Burman NievesWilliam  Stevens M.D.   On: 11/06/2020 04:29     Assessment/Plan 86 year old female with history of essential tremor, hypothyroidism, presenting with right hip fracture from an accidental fall    Closed displaced fracture of right femoral neck (HCC)   Accidental fall   Preoperative clearance -N.p.o. -Pain management, IV fluids -Further management per orthopedist -Patient at low risk for perioperative  cardiopulmonary complications.  Main risk factors advanced age    Essential tremor -Continue carbidopa pending med rec  Dementia -Appears stable without behavioral changes    Anemia -Hemoglobin 11.8, down from 12.4 a couple months -Continue to monitor    Hypothyroidism -Continue levothyroxine  Underweight, suspect moderate protein calorie malnutrition -BMI 16.14 -Nutritionist evaluation    DVT prophylaxis: SCDs  code Status: DNR Family Communication: Niece at bedside.  Plan of care explained.  Confirms patient is DNR Disposition Plan: Back to previous home environment Consults called: Dr. Joice Lofts Status: Inpatient for inpatient only procedure    Andris Baumann MD [facility]     11/06/2020, 5:13 AM

## 2020-11-06 NOTE — Progress Notes (Signed)
Initial Nutrition Assessment  DOCUMENTATION CODES:   Underweight  INTERVENTION:  Ensure Enlive po daily, each supplement provides 350 kcal and 20 grams of protein   NUTRITION DIAGNOSIS:   Increased nutrient needs related to post-op healing (right hip fracture) as evidenced by estimated needs.    GOAL:   Patient will meet greater than or equal to 90% of their needs    MONITOR:   Weight trends, Labs, I & O's, Supplement acceptance, Skin, Diet advancement, PO intake  REASON FOR ASSESSMENT:   Consult Assessment of nutrition requirement/status (hip fracture)  ASSESSMENT:    86 year old female with history significant for essential tremor, cognitive dysfunction, hypothyroidism, hearing impairment admitted for closed displaced fracture of right femoral neck after unwitnessed fall in bathroom at home.  Patient off floor this morning for surgery. Unable to obtain nutrition history at this time. Will monitor for diet advancement, and order Ensure daily to aid with  meeting needs. Per chart, weights have trended down ~8 lbs (7%) in the last 4 months which is insignificant for time frame, however concerning given advanced age and pt is underweight. Will plan to perform exam at follow-up.   Medications reviewed and include: NaCl @ 100 ml/hr  Labs: Na 134 (L), BUN 25 (H)   NUTRITION - FOCUSED PHYSICAL EXAM: Unable to complete at this time, pt off floor for procedure   Diet Order:   Diet Order            Diet NPO time specified  Diet effective now                 EDUCATION NEEDS:   No education needs have been identified at this time  Skin:  Skin Assessment: Skin Integrity Issues: Skin Integrity Issues:: Incisions, Other (Comment) Incisions: closed;right hip Other: abraision; mid back  Last BM:  pta  Height:   Ht Readings from Last 1 Encounters:  11/06/20 5\' 6"  (1.676 m)    Weight:   Wt Readings from Last 1 Encounters:  11/06/20 45.5 kg    BMI:  Body  mass index is 16.19 kg/m.  Estimated Nutritional Needs:   Kcal:  1400-1600  Protein:  70-80  Fluid:  >1.2 L   11/08/20, RD, LDN Clinical Nutrition After Hours/Weekend Pager # in Amion

## 2020-11-06 NOTE — Op Note (Signed)
11/06/2020  1:12 PM  Patient:   [name] [name]  Pre-Op Diagnosis:   Displaced femoral neck fracture, right hip.  Post-Op Diagnosis:   Same.  Procedure:   Right hip unipolar hemiarthroplasty.  Surgeon:   Maryagnes Amos, MD  Assistant:   Theresa Mulligan, RST  Anesthesia:   Spinal  Findings:   As above.  Complications:   None  EBL:   75 cc  Fluids:   500 cc crystalloid  UOP:   125 cc  TT:   None  Drains:   None  Closure:   Staples  Implants:   Biomet press-fit system with a #9 cemented laterally offset Echo femoral stem, a 48 mm outer diameter shell, and a -3 mm neck adapter.  Brief Clinical Note:   The patient is a 86 year old female who sustained the above-noted injury early this morning when she apparently fell while getting up to go the bathroom. She was brought to the emergency room where x-rays demonstrated the above-noted injury. The patient has been cleared medically and presents at this time for definitive management of the injury.  Procedure:   The patient was brought into the operating room. After adequate spinal anesthesia was obtained, the patient was repositioned in the left lateral decubitus position and secured using a lateral hip positioner. The right hip and lower extremity were prepped with ChloroPrep solution before being draped sterilely. Preoperative antibiotics were administered. A timeout was performed to verify the appropriate surgical site before a standard posterior approach to the hip was made through an approximately 4-5 inch incision. The incision was carried down through the subcutaneous tissues to expose the gluteal fascia and proximal end of the iliotibial band. These structures were split the length of the incision and the Charnley self-retaining hip retractor placed. The bursal tissues were swept posteriorly to expose the short external rotators. The anterior border of the piriformis tendon was identified and this plane developed down through the  capsule to enter the joint. Abundant fracture hematoma was suctioned. A flap of tissue was elevated off the posterior aspect of the femoral neck and greater trochanter and retracted posteriorly. This flap included the piriformis tendon, the short external rotators, and the posterior capsule. The femoral head was removed in its entirety, then taken to the back table where it was measured and found to be optimally replicated by a 48 mm head. The appropriate trial head was inserted and found to demonstrate an excellent suction fit.   Attention was directed to the femoral side. The femoral neck was recut 10-12 mm above the lesser trochanter using an oscillating saw. The piriformis fossa was debrided of soft tissues before the intramedullary canal was accessed through this point using a triple step reamer. The canal was broached sequentially beginning with a #7 broach and progressing to a #11 broach. This was left in place and several trial reductions performed. Given the patient's age, it was felt best to cement the stem. Therefore, the canal was prepared for cementing by irrigating it thoroughly before packing it with a Neo-Synephrine soaked vaginal sponge. Prior to inserting the sponge, the canal diameter was assessed and a #3 cement restrictor placed distally. The cement was mixed on the back table. When it was ready, the sponge was removed and the cement inserted under pressure using a cement gun. The permanent #9 cemented laterally offset femoral stem was inserted into place and held securely until the cement hardened. The excess cement was removed using a Therapist, nutritional.   Once  the cement hardened, a repeat trial reduction was performed using both the -6 mm and -3 mm neck lengths. The -3 mm neck length demonstrated excellent stability both in extension and external rotation as well as with flexion to 90 and internal rotation beyond 70. It also was stable in the position of sleep. The 48 mm outer diameter  shell with the -3 mm neck adapter construct was put together on the back table before being impacted onto the stem of the femoral component. The Morse taper locking mechanism was verified using manual distraction before the head was relocated and the hip placed through a range of motion with the findings as described above.  The wound was copiously irrigated with bacitracin saline solution via the jet lavage system before the peri-incisional and pericapsular tissues were injected with 30 cc of 0.5% Sensorcaine with epinephrine and 20 cc of Exparel diluted out to 90 cc with normal saline to help with postoperative analgesia. The posterior flap was reapproximated to the posterior aspect of the greater trochanter using #2 Tycron interrupted sutures placed through drill holes. The iliotibial band was reapproximated using #1 Vicryl interrupted sutures before the gluteal fascia was closed using a running #1 Vicryl suture. At this point, 1 g of transexemic acid in 10 cc of normal saline was injected into the joint to help reduce postoperative bleeding. The subcutaneous tissues were closed in several layers using 2-0 Vicryl interrupted sutures before the skin was closed using staples. A sterile occlusive dressing was applied to the wound . The patient then was rolled back into the supine position on the hospital bed before being awakened and returned to the recovery room in satisfactory condition after tolerating the procedure well.

## 2020-11-07 DIAGNOSIS — S72001A Fracture of unspecified part of neck of right femur, initial encounter for closed fracture: Secondary | ICD-10-CM | POA: Diagnosis not present

## 2020-11-07 LAB — BASIC METABOLIC PANEL
Anion gap: 7 (ref 5–15)
BUN: 27 mg/dL — ABNORMAL HIGH (ref 8–23)
CO2: 23 mmol/L (ref 22–32)
Calcium: 8.3 mg/dL — ABNORMAL LOW (ref 8.9–10.3)
Chloride: 103 mmol/L (ref 98–111)
Creatinine, Ser: 0.88 mg/dL (ref 0.44–1.00)
GFR, Estimated: 59 mL/min — ABNORMAL LOW (ref >60)
Glucose, Bld: 126 mg/dL — ABNORMAL HIGH (ref 70–99)
Potassium: 4.6 mmol/L (ref 3.5–5.1)
Sodium: 133 mmol/L — ABNORMAL LOW (ref 135–145)

## 2020-11-07 LAB — CBC
HCT: 29.5 % — ABNORMAL LOW (ref 36.0–46.0)
Hemoglobin: 9.9 g/dL — ABNORMAL LOW (ref 12.0–15.0)
MCH: 32.1 pg (ref 26.0–34.0)
MCHC: 33.6 g/dL (ref 30.0–36.0)
MCV: 95.8 fL (ref 80.0–100.0)
Platelets: 196 10*3/uL (ref 150–400)
RBC: 3.08 MIL/uL — ABNORMAL LOW (ref 3.87–5.11)
RDW: 15.1 % (ref 11.5–15.5)
WBC: 15.4 10*3/uL — ABNORMAL HIGH (ref 4.0–10.5)
nRBC: 0 % (ref 0.0–0.2)

## 2020-11-07 MED ORDER — MORPHINE SULFATE (PF) 2 MG/ML IV SOLN: 0.5000 mg | INTRAVENOUS | Status: DC | PRN

## 2020-11-07 MED ORDER — CARBIDOPA-LEVODOPA 25-100 MG PO TABS: 1.0000 | ORAL_TABLET | Freq: Three times a day (TID) | ORAL | Status: DC

## 2020-11-07 NOTE — Progress Notes (Signed)
Patient is alert to self and has been refusing O2 for this shift. Received notification from central telemetry regarding low sats. Notified on-call NP.

## 2020-11-07 NOTE — Anesthesia Postprocedure Evaluation (Signed)
Anesthesia Post Note  Patient: [name] [name]  Procedure(s) Performed: ARTHROPLASTY BIPOLAR HIP (HEMIARTHROPLASTY) (Right Hip)  Patient location during evaluation: Nursing Unit Anesthesia Type: Spinal Level of consciousness: awake and confused (dementia at baseline) Pain management: pain level controlled Vital Signs Assessment: post-procedure vital signs reviewed and stable Respiratory status: spontaneous breathing, respiratory function stable and patient connected to nasal cannula oxygen Cardiovascular status: blood pressure returned to baseline and stable Postop Assessment: no headache, no backache and no apparent nausea or vomiting Anesthetic complications: no   No complications documented.   Last Vitals:  Vitals:   11/07/20 0513 11/07/20 0822  BP: (!) 152/59 118/66  Pulse: 84 90  Resp: 19 18  Temp: 36.6 C 37.9 C  SpO2: (!) 85% 90%    Last Pain:  Vitals:   11/07/20 0822  TempSrc: Axillary  PainSc:                  Corinda Gubler

## 2020-11-07 NOTE — Progress Notes (Signed)
PT Cancellation Note  Patient Details Name: Jerita Wimbush MRN: [id number] DOB: April 02, 1918   Cancelled Treatment:    Reason Eval/Treat Not Completed: Patient not medically ready.  Chart reviewed pt and attempted to see.  After speaking with RN, pt currently has essential tremors and is A&O x1.  RN also notes pt to be "Marzella Schlein".  Therapist was unable to orient pt to person during time spent in room and pt was unable to follow any commands.  Pt will attempt to be seen at another date as medically appropriate.   Nolon Bussing, PT, DPT 11/07/20, 12:21 PM

## 2020-11-07 NOTE — Progress Notes (Signed)
[facility]  - [place] at [facility]   PATIENT NAME: [name] [name]    MR#:  [id number]  DATE OF BIRTH:  1918/03/29  SUBJECTIVE:  patient came in from home after having a mechanical fall. Niece [name] is the [facility]  Patient has advanced dementia at baseline according to the niece. Per RN removing oxygen and tele leads.   REVIEW OF SYSTEMS:   Review of Systems  Unable to perform ROS: Dementia   Tolerating Diet: Tolerating PT: pending today  DRUG ALLERGIES:  No Known Allergies  VITALS:  Blood pressure 118/66, pulse 90, temperature 100.2 F (37.9 C), temperature source Axillary, resp. rate 18, height 5\' 6"  (1.676 m), weight 45.5 kg, SpO2 90 %.  PHYSICAL EXAMINATION:   Physical Exam  GENERAL:  86 y.o.-year-old patient lying in the bed with no acute distress.fraile HEENT: Head atraumatic, normocephalic. Oropharynx and nasopharynx clear. Dry lips LUNGS: Normal breath sounds bilaterally, no wheezing, rales, rhonchi. No use of accessory muscles of respiration.  CARDIOVASCULAR: S1, S2 normal. No murmurs, rubs, or gallops.  ABDOMEN: Soft, nontender, nondistended. Bowel sounds present. No organomegaly or mass.  EXTREMITIES: No cyanosis, clubbing or edema b/l.    NEUROLOGIC: unable to examine due to dementia--moves extremities well PSYCHIATRIC:  Advanced dementia SKIN: bruise over the ribs and back from fall  LABORATORY PANEL:  CBC Recent Labs  Lab 11/07/20 0428  WBC 15.4*  HGB 9.9*  HCT 29.5*  PLT 196    Chemistries  Recent Labs  Lab 11/06/20 0347 11/06/20 0347 11/07/20 0428  NA 134*   < > 133*  K 4.0   < > 4.6  CL 99   < > 103  CO2 27   < > 23  GLUCOSE 97   < > 126*  BUN 25*   < > 27*  CREATININE 0.74   < > 0.88  CALCIUM 8.8*   < > 8.3*  AST 30  --   --   ALT <5  --   --   ALKPHOS 97  --   --   BILITOT 0.5  --   --    < > = values in this interval not displayed.   Cardiac Enzymes No results for input(s): TROPONINI in the last 168  hours. RADIOLOGY:  CT HEAD WO CONTRAST  Result Date: 11/06/2020 CLINICAL DATA:  Initial evaluation for acute head trauma, minor. EXAM: CT HEAD WITHOUT CONTRAST TECHNIQUE: Contiguous axial images were obtained from the base of the skull through the vertex without intravenous contrast. COMPARISON:  None. FINDINGS: Brain: Generalized age-related cerebral atrophy with mild chronic small vessel ischemic disease. No acute intracranial hemorrhage. No acute large vessel territory infarct. Approximate 1 cm right parafalcine meningioma seen at the anterior right frontal falx (series 3, image 12). No associated mass effect or edema. No other mass lesion, mass effect, or midline shift. No hydrocephalus or extra-axial fluid collection. Vascular: No hyperdense vessel. Scattered vascular calcifications noted within the carotid siphons. Skull: Scalp soft tissues demonstrate no acute finding. Calvarium intact. Lucent lesion at the left frontoparietal calvarium noted, indeterminate, but most likely benign. Sinuses/Orbits: Globes orbital soft tissues within normal limits. Mild scattered mucosal thickening noted within the sphenoid ethmoidal sinuses. Paranasal sinuses are otherwise clear. No mastoid effusion. Other: None. IMPRESSION: 1. No acute intracranial abnormality. 2. 1 cm right parafalcine meningioma without associated mass effect or edema. 3. Generalized age-related cerebral atrophy with mild chronic small vessel ischemic disease. Electronically Signed   By: 11/08/2020.D.  On: 11/06/2020 04:23   CT CERVICAL SPINE WO CONTRAST  Result Date: 11/06/2020 CLINICAL DATA:  Initial evaluation for acute trauma, fall. EXAM: CT CERVICAL SPINE WITHOUT CONTRAST TECHNIQUE: Multidetector CT imaging of the cervical spine was performed without intravenous contrast. Multiplanar CT image reconstructions were also generated. COMPARISON:  None available. FINDINGS: Alignment: Straightening of the normal cervical lordosis. Trace  anterolisthesis of C7 on T1-T1 on T2, chronic and facet mediated. Skull base and vertebrae: Skull base intact. Normal C1-2 articulations are preserved in the dens is intact. Vertebral body heights maintained. No acute fracture. Soft tissues and spinal canal: Soft tissues of the neck demonstrate no acute finding. No abnormal prevertebral edema. Spinal canal within normal limits. Prominent vascular calcifications about the carotid bifurcations. Disc levels: Mild cervical spondylosis present at C3-4 through C6-7. Upper chest: Visualized upper chest demonstrates no acute finding. Partially visualized lung apices are clear. Other: None. IMPRESSION: 1. No acute traumatic injury within the cervical spine. 2. Mild cervical spondylosis at C3-4 through C6-7. Electronically Signed   By: Rise Mu M.D.   On: 11/06/2020 04:42   DG Chest Portable 1 View  Result Date: 11/06/2020 CLINICAL DATA:  Preoperative right hip fracture EXAM: PORTABLE CHEST 1 VIEW COMPARISON:  None. FINDINGS: Heart size and pulmonary vascularity are normal. Emphysematous changes in the lungs. No airspace disease or consolidation. No pleural effusions. No pneumothorax. Calcification of the aorta. Mediastinal contours appear intact. IMPRESSION: Emphysematous changes in the lungs. No evidence of active pulmonary disease. Electronically Signed   By: Burman Nieves M.D.   On: 11/06/2020 04:30   DG HIP UNILAT W OR W/O PELVIS 2-3 VIEWS RIGHT  Result Date: 11/06/2020 CLINICAL DATA:  Right hip replacement. EXAM: DG HIP (WITH OR WITHOUT PELVIS) 2-3V RIGHT COMPARISON:  Right hip x-rays from same day. FINDINGS: The right hip demonstrates a hemiarthroplasty without evidence of hardware failure or complication. There is no fracture or dislocation. The alignment is anatomic. Post-surgical changes and subcutaneous emphysema noted in the surrounding soft tissues. Prior left hip hemiarthroplasty. IMPRESSION: 1. Interval right hip hemiarthroplasty  without acute postoperative complication. Electronically Signed   By: Obie Dredge M.D.   On: 11/06/2020 13:56   DG Hip Unilat With Pelvis 2-3 Views Right  Result Date: 11/06/2020 CLINICAL DATA:  Right hip pain after fall EXAM: DG HIP (WITH OR WITHOUT PELVIS) 2-3V RIGHT COMPARISON:  None. FINDINGS: Fracture of the right femoral neck with varus angulation. Small bone fragments are projected medially. Can not exclude extension into the lesser trochanter. The pelvis appears intact. Previous left hip hemiarthroplasty. Degenerative changes in the lower lumbar spine. Diffuse bone demineralization. IMPRESSION: Fracture of the right femoral neck with varus angulation. Small medial bone fragments are present. Can not exclude extension into the lesser trochanter. Electronically Signed   By: Burman Nieves M.D.   On: 11/06/2020 04:29   ASSESSMENT AND PLAN:   86 year old female with history of essential tremor, hypothyroidism, presenting with right hip fracture from an accidental fall    Closed displaced fracture of right femoral neck (HCC)   Accidental fall at home -POD #1 s/p  Right hip unipolar hemiarthroplasty. -Pain management, IV fluids -orthopedic consultation with Dr. Joice Lofts --PT/OT to see    Essential tremor -Continue carbidopa   Dementia-Advanced -Appears stable without behavioral changes    Anemia -Hemoglobin 11.8 --post surgery 9.9 -baseline 12.4 a couple months ago -Continue to monitor    Hypothyroidism -Continue levothyroxine  Underweight, suspect moderate protein calorie malnutrition -BMI 16.14 -Nutritionist evaluation  DVT prophylaxis: lovenox code Status: DNR Family Communication: Niece  Confirms patient is DNR Disposition Plan: TBD Consults called: Dr. Joice Lofts Status: Inpatient    Dispo: The patient is from: Home              Anticipated d/c is to: TBD              Anticipated d/c date is: 2 days              Patient currently is not medically  stable to d/c.  Patient admitted with mechanical hip fracture. She is POD 1  TOC for discharge planning. PT OT evaluation today if pt able to participate      TOTAL TIME TAKING CARE OF THIS PATIENT: 22 minutes.  >50% time spent on counselling and coordination of care  Note: This dictation was prepared with Dragon dictation along with smaller phrase technology. Any transcriptional errors that result from this process are unintentional.  [name] M.D    [facility]   CC: Primary care physician; Johnna Acosta, NP (Inactive)Patient ID: [name] [name], female   DOB: 02-07-1918, 86 y.o.   MRN: [id number]

## 2020-11-07 NOTE — Progress Notes (Signed)
  Subjective: 1 Day Post-Op Procedure(s) (LRB): ARTHROPLASTY BIPOLAR HIP (HEMIARTHROPLASTY) (Right) Unable to obtain a pain score due to advanced dementia  PT and care management to assist with discharge planning.. Negative for chest pain and shortness of breath Fever: yes, 100.2  Objective: Vital signs in last 24 hours: Temp:  [97.3 F (36.3 C)-100.2 F (37.9 C)] 100.2 F (37.9 C) (11/27 0822) Pulse Rate:  [84-108] 90 (11/27 0822) Resp:  [14-19] 18 (11/27 0822) BP: (92-152)/(39-91) 118/66 (11/27 0822) SpO2:  [85 %-100 %] 90 % (11/27 0822)  Intake/Output from previous day:  Intake/Output Summary (Last 24 hours) at 11/07/2020 1124 Last data filed at 11/07/2020 1014 Gross per 24 hour  Intake 800 ml  Output 375 ml  Net 425 ml    Intake/Output this shift: No intake/output data recorded.  Labs: Recent Labs    11/06/20 0347 11/07/20 0428  HGB 11.8* 9.9*   Recent Labs    11/06/20 0347 11/07/20 0428  WBC 6.8 15.4*  RBC 3.74* 3.08*  HCT 36.0 29.5*  PLT 243 196   Recent Labs    11/06/20 0347 11/07/20 0428  NA 134* 133*  K 4.0 4.6  CL 99 103  CO2 27 23  BUN 25* 27*  CREATININE 0.74 0.88  GLUCOSE 97 126*  CALCIUM 8.8* 8.3*   Recent Labs    11/06/20 0347  INR 1.0     EXAM General - Patient is Disorganized and Confused Extremity - ABD soft Sensation intact distally Dorsiflexion/Plantar flexion intact Incision: dressing C/D/I Dressing/Incision - clean, dry, no drainage Motor Function - intact, moving foot and toes well on exam.   Past Medical History:  Diagnosis Date  . Essential tremor   . Failure to thrive in adult   . Hyponatremia   . Senile asthenia   . Thyroid disease   . Unsteady gait     Assessment/Plan: 1 Day Post-Op Procedure(s) (LRB): ARTHROPLASTY BIPOLAR HIP (HEMIARTHROPLASTY) (Right) Principal Problem:   Closed displaced fracture of right femoral neck (HCC) Active Problems:   Essential tremor   Accidental fall   Preoperative  clearance   Anemia   Hypothyroidism   Underweight   Cognitive dysfunction   Hearing impairment  Estimated body mass index is 16.19 kg/m as calculated from the following:   Height as of this encounter: 5\' 6"  (1.676 m).   Weight as of this encounter: 45.5 kg. Advance diet Up with therapy D/C IV fluids   Patient with advanced dementia, pulling at IV this AM. Labs reviewed this AM. WBC 15.4, recent temp of 100.  No signs of infection to the incision.  Continue to monitor, Urine culture was obtained at the time of surgery. Work with PT today if able. Will need SNF upon discharge.  DVT Prophylaxis - Lovenox Weight-Bearing as tolerated to right leg  J. , PA-C [facility] Orthopaedic Surgery 11/07/2020, 11:24 AM

## 2020-11-08 DIAGNOSIS — F0280 Dementia in other diseases classified elsewhere without behavioral disturbance: Secondary | ICD-10-CM

## 2020-11-08 DIAGNOSIS — W19XXXA Unspecified fall, initial encounter: Secondary | ICD-10-CM | POA: Diagnosis not present

## 2020-11-08 DIAGNOSIS — G309 Alzheimer's disease, unspecified: Secondary | ICD-10-CM

## 2020-11-08 DIAGNOSIS — E039 Hypothyroidism, unspecified: Secondary | ICD-10-CM | POA: Diagnosis not present

## 2020-11-08 DIAGNOSIS — Y92009 Unspecified place in unspecified non-institutional (private) residence as the place of occurrence of the external cause: Secondary | ICD-10-CM

## 2020-11-08 DIAGNOSIS — R636 Underweight: Secondary | ICD-10-CM

## 2020-11-08 DIAGNOSIS — S72001A Fracture of unspecified part of neck of right femur, initial encounter for closed fracture: Secondary | ICD-10-CM | POA: Diagnosis not present

## 2020-11-08 LAB — BASIC METABOLIC PANEL
Anion gap: 7 (ref 5–15)
BUN: 29 mg/dL — ABNORMAL HIGH (ref 8–23)
CO2: 22 mmol/L (ref 22–32)
Calcium: 8.3 mg/dL — ABNORMAL LOW (ref 8.9–10.3)
Chloride: 106 mmol/L (ref 98–111)
Creatinine, Ser: 0.82 mg/dL (ref 0.44–1.00)
GFR, Estimated: >60 mL/min (ref >60)
Glucose, Bld: 114 mg/dL — ABNORMAL HIGH (ref 70–99)
Potassium: 3.9 mmol/L (ref 3.5–5.1)
Sodium: 135 mmol/L (ref 135–145)

## 2020-11-08 LAB — URINE CULTURE: Culture: NO GROWTH

## 2020-11-08 LAB — CBC
HCT: 28.3 % — ABNORMAL LOW (ref 36.0–46.0)
Hemoglobin: 9.5 g/dL — ABNORMAL LOW (ref 12.0–15.0)
MCH: 32.1 pg (ref 26.0–34.0)
MCHC: 33.6 g/dL (ref 30.0–36.0)
MCV: 95.6 fL (ref 80.0–100.0)
Platelets: 193 10*3/uL (ref 150–400)
RBC: 2.96 MIL/uL — ABNORMAL LOW (ref 3.87–5.11)
RDW: 15.6 % — ABNORMAL HIGH (ref 11.5–15.5)
WBC: 12.7 10*3/uL — ABNORMAL HIGH (ref 4.0–10.5)
nRBC: 0 % (ref 0.0–0.2)

## 2020-11-08 MED ORDER — ENOXAPARIN SODIUM 30 MG/0.3ML ~~LOC~~ SOLN: 30.0000 mg | SUBCUTANEOUS | 0 refills | Status: AC

## 2020-11-08 MED ORDER — TRAMADOL HCL 50 MG PO TABS: 50.0000 mg | ORAL_TABLET | Freq: Four times a day (QID) | ORAL | 0 refills | Status: AC | PRN

## 2020-11-08 NOTE — Progress Notes (Signed)
Subjective: 2 Days Post-Op Procedure(s) (LRB): ARTHROPLASTY BIPOLAR HIP (HEMIARTHROPLASTY) (Right) Unable to obtain a pain score due to advanced dementia  PT and care management to assist with discharge planning. Negative for chest pain and shortness of breath Most recent HR 105, did run fevers last night.  Objective: Vital signs in last 24 hours: Temp:  [98 F (36.7 C)-100.7 F (38.2 C)] 98.1 F (36.7 C) (11/28 0424) Pulse Rate:  [73-105] 105 (11/28 0424) Resp:  [19-20] 20 (11/28 0424) BP: (109-152)/(70-94) 152/94 (11/28 0424) SpO2:  [84 %-94 %] 86 % (11/28 0424)  Intake/Output from previous day:  Intake/Output Summary (Last 24 hours) at 11/08/2020 0904 Last data filed at 11/07/2020 1014 Gross per 24 hour  Intake 0 ml  Output --  Net 0 ml    Intake/Output this shift: No intake/output data recorded.  Labs: Recent Labs    11/06/20 0347 11/07/20 0428 11/08/20 0446  HGB 11.8* 9.9* 9.5*   Recent Labs    11/07/20 0428 11/08/20 0446  WBC 15.4* 12.7*  RBC 3.08* 2.96*  HCT 29.5* 28.3*  PLT 196 193   Recent Labs    11/07/20 0428 11/08/20 0446  NA 133* 135  K 4.6 3.9  CL 103 106  CO2 23 22  BUN 27* 29*  CREATININE 0.88 0.82  GLUCOSE 126* 114*  CALCIUM 8.3* 8.3*   Recent Labs    11/06/20 0347  INR 1.0     EXAM General - Patient is Disorganized and Confused Extremity - ABD soft Sensation intact distally Dorsiflexion/Plantar flexion intact Incision: dressing C/D/I Dressing/Incision - clean, dry, no drainage Motor Function - intact, moving foot and toes well on exam.   Past Medical History:  Diagnosis Date  . Essential tremor   . Failure to thrive in adult   . Hyponatremia   . Senile asthenia   . Thyroid disease   . Unsteady gait     Assessment/Plan: 2 Days Post-Op Procedure(s) (LRB): ARTHROPLASTY BIPOLAR HIP (HEMIARTHROPLASTY) (Right) Principal Problem:   Closed displaced fracture of right femoral neck (HCC) Active Problems:   Essential  tremor   Accidental fall   Preoperative clearance   Anemia   Hypothyroidism   Underweight   Cognitive dysfunction   Hearing impairment  Estimated body mass index is 16.19 kg/m as calculated from the following:   Height as of this encounter: 5\' 6"  (1.676 m).   Weight as of this encounter: 45.5 kg. Advance diet Up with therapy D/C IV fluids   Patient with advanced dementia, unable to give any info on pain score or give any responses. Labs reviewed this AM. WBC down to 12.7 this morning from 15.4 Hg 9.5 this morning. Urine culture was obtained, will order UA for today given continued fever. Work with PT today if able. Will need SNF upon discharge.  DVT Prophylaxis - Lovenox Weight-Bearing as tolerated to right leg  J. , PA-C [facility] Orthopaedic Surgery 11/08/2020, 9:04 AM

## 2020-11-08 NOTE — [place] FL2 (Signed)
[place] MEDICAID FL2 LEVEL OF CARE SCREENING TOOL     IDENTIFICATION  Patient Name: [name] [name] Birthdate: 04/10/1918 Sex: female Admission Date (Current Location): 11/06/2020  [place] and [place] Number:  Chiropodist and Address:  [facility], [address], [place], [place] [address]      Provider Number: [id number]  Attending Physician Name and Address:  [name], MD  Relative Name and Phone Number:  Eliezer Lofts [phone]    Current Level of Care: Hospital Recommended Level of Care: Skilled Nursing Facility Prior Approval Number:    Date Approved/Denied:   PASRR Number: [id number]  Discharge Plan: SNF    Current Diagnoses: Patient Active Problem List   Diagnosis Date Noted  . Alzheimer's dementia without behavioral disturbance (HCC)   . Closed displaced fracture of right femoral neck (HCC) 11/06/2020  . Fall at home 11/06/2020  . Preoperative clearance 11/06/2020  . Anemia 11/06/2020  . Closed right hip fracture, initial encounter (HCC) 11/06/2020  . Hypothyroidism 11/06/2020  . Underweight 11/06/2020  . Cognitive dysfunction 11/06/2020  . Hearing impairment 11/06/2020  . Essential tremor   . Failure to thrive in adult   . Hyponatremia   . Unsteady gait     Orientation RESPIRATION BLADDER Height & Weight     Self  O2 (2L) Incontinent Weight: 100 lb 5 oz (45.5 kg) Height:  5\' 6"  (167.6 cm)  BEHAVIORAL SYMPTOMS/MOOD NEUROLOGICAL BOWEL NUTRITION STATUS      Incontinent Diet  AMBULATORY STATUS COMMUNICATION OF NEEDS Skin   Extensive Assist Verbally Normal, Surgical wounds (right hip)                       Personal Care Assistance Level of Assistance  Bathing, Feeding, Dressing Bathing Assistance: Maximum assistance Feeding assistance: Maximum assistance Dressing Assistance: Maximum assistance     Functional Limitations Info  Sight, Hearing, Speech Sight Info: Impaired Hearing Info: Adequate Speech Info:  Adequate    SPECIAL CARE FACTORS FREQUENCY  PT (By licensed PT), OT (By licensed OT)     PT Frequency: 5x week OT Frequency: 5x week            Contractures Contractures Info: Not present    Additional Factors Info  Code Status Code Status Info: DNR             Current Medications (11/08/2020):  This is the current hospital active medication list Current Facility-Administered Medications  Medication Dose Route Frequency Provider Last Rate Last Admin  . acetaminophen (TYLENOL) tablet 325-650 mg  325-650 mg Oral Q6H PRN Poggi, 11/10/2020, MD      . bisacodyl (DULCOLAX) suppository 10 mg  10 mg Rectal Daily PRN Poggi, Excell Seltzer, MD      . carbidopa-levodopa (SINEMET IR) 25-100 MG per tablet immediate release 1 tablet  1 tablet Oral Excell Seltzer [id number], MD   1 tablet at 11/08/20 1511  . carbidopa-levodopa (SINEMET IR) 25-100 MG per tablet immediate release 2 tablet  2 tablet Oral QHS 11/10/20, MD   2 tablet at 11/07/20 2031  . Chlorhexidine Gluconate Cloth 2 % PADS 6 each  6 each Topical Daily 2032, MD      . diphenhydrAMINE (BENADRYL) 12.5 MG/5ML elixir 12.5-25 mg  12.5-25 mg Oral Q4H PRN Poggi, 01-11-1985, MD      . docusate sodium (COLACE) capsule 100 mg  100 mg Oral BID Poggi, Excell Seltzer, MD   100 mg at 11/08/20 0830  .  enoxaparin (LOVENOX) injection 30 mg  30 mg Subcutaneous Q24H Poggi, Excell Seltzer, MD   30 mg at 11/08/20 0830  . feeding supplement (ENSURE ENLIVE / ENSURE PLUS) liquid 237 mL  237 mL Oral Q24H [name], MD   237 mL at 11/08/20 1511  . HYDROcodone-acetaminophen (NORCO/VICODIN) 5-325 MG per tablet 1-2 tablet  1-2 tablet Oral Q6H PRN Poggi, Excell Seltzer, MD   1 tablet at 11/07/20 2053  . levothyroxine (SYNTHROID) tablet 100 mcg  100 mcg Oral [id number] Christena Flake, MD   100 mcg at 11/08/20 0752  . LORazepam (ATIVAN) tablet 0.5 mg  0.5 mg Oral Q4H PRN Poggi, Excell Seltzer, MD   0.5 mg at 11/07/20 1405  . magnesium hydroxide (MILK OF MAGNESIA) suspension 30 mL  30 mL Oral Daily PRN Poggi,  Excell Seltzer, MD   30 mL at 11/08/20 1510  . morphine 2 MG/ML injection 0.5 mg  0.5 mg Intravenous Q4H PRN [name], MD      . multivitamin with minerals tablet 1 tablet  1 tablet Oral Daily [name], MD   1 tablet at 11/08/20 0830  . ondansetron (ZOFRAN) tablet 4 mg  4 mg Oral Q6H PRN Poggi, Excell Seltzer, MD       Or  . ondansetron (ZOFRAN) injection 4 mg  4 mg Intravenous Q6H PRN Poggi, Excell Seltzer, MD      . primidone (MYSOLINE) tablet 50 mg  50 mg Oral QHS Poggi, Excell Seltzer, MD   50 mg at 11/07/20 2053  . sodium phosphate (FLEET) 7-19 GM/118ML enema 1 enema  1 enema Rectal Once PRN Poggi, Excell Seltzer, MD      . traMADol Janean Sark) tablet 50 mg  50 mg Oral Q6H PRN Poggi, Excell Seltzer, MD      . vitamin B-12 (CYANOCOBALAMIN) tablet 100 mcg  100 mcg Oral Daily Poggi, Excell Seltzer, MD   100 mcg at 11/08/20 4332     Discharge Medications: Please see discharge summary for a list of discharge medications.  Relevant Imaging Results:  Relevant Lab Results:   Additional Information SS: [id number]  Maud Deed, LCSW

## 2020-11-08 NOTE — Progress Notes (Signed)
[facility]  - [place] at [facility]   PATIENT NAME: [name] [name]    MR#:  [id number]  DATE OF BIRTH:  1918-11-17  SUBJECTIVE:  patient came in from home after having a mechanical fall. Niece [name] is the [facility]  Patient has advanced dementia at baseline according to the niece. Patient did work a little bit with physical therapy.  REVIEW OF SYSTEMS:   Review of Systems  Unable to perform ROS: Dementia   Tolerating Diet: Tolerating PT: pending today  DRUG ALLERGIES:  No Known Allergies  VITALS:  Blood pressure (!) 154/93, pulse (!) 110, temperature 98.1 F (36.7 C), resp. rate 18, height 5\' 6"  (1.676 m), weight 45.5 kg, SpO2 97 %.  PHYSICAL EXAMINATION:   Physical Examlimited due to dementia  GENERAL:  86 y.o.-year-old patient lying in the bed with no acute distress.fraile HEENT: Head atraumatic, normocephalic. Oropharynx and nasopharynx clear. Dry lips LUNGS: Normal breath sounds bilaterally, no wheezing, rales, rhonchi. No use of accessory muscles of respiration.  CARDIOVASCULAR: S1, S2 normal. No murmurs, rubs, or gallops.  ABDOMEN: Soft, nontender, nondistended. Bowel sounds present. No organomegaly or mass.  EXTREMITIES: No cyanosis, clubbing or edema b/l.    NEUROLOGIC: unable to examine due to dementia--moves extremities well PSYCHIATRIC:  Advanced dementia SKIN: bruise over the ribs and back from fall  LABORATORY PANEL:  CBC Recent Labs  Lab 11/08/20 0446  WBC 12.7*  HGB 9.5*  HCT 28.3*  PLT 193    Chemistries  Recent Labs  Lab 11/06/20 0347 11/07/20 0428 11/08/20 0446  NA 134*   < > 135  K 4.0   < > 3.9  CL 99   < > 106  CO2 27   < > 22  GLUCOSE 97   < > 114*  BUN 25*   < > 29*  CREATININE 0.74   < > 0.82  CALCIUM 8.8*   < > 8.3*  AST 30  --   --   ALT <5  --   --   ALKPHOS 97  --   --   BILITOT 0.5  --   --    < > = values in this interval not displayed.   Cardiac Enzymes No results for input(s): TROPONINI in the  last 168 hours. RADIOLOGY:  DG HIP UNILAT W OR W/O PELVIS 2-3 VIEWS RIGHT  Result Date: 11/06/2020 CLINICAL DATA:  Right hip replacement. EXAM: DG HIP (WITH OR WITHOUT PELVIS) 2-3V RIGHT COMPARISON:  Right hip x-rays from same day. FINDINGS: The right hip demonstrates a hemiarthroplasty without evidence of hardware failure or complication. There is no fracture or dislocation. The alignment is anatomic. Post-surgical changes and subcutaneous emphysema noted in the surrounding soft tissues. Prior left hip hemiarthroplasty. IMPRESSION: 1. Interval right hip hemiarthroplasty without acute postoperative complication. Electronically Signed   By: 11/08/2020 M.D.   On: 11/06/2020 13:56   ASSESSMENT AND PLAN:   86 year old female with history of essential tremor, hypothyroidism, presenting with right hip fracture from an accidental fall    Closed displaced fracture of right femoral neck (HCC)   Accidental fall at home -POD # 2 s/p  Right hip unipolar hemiarthroplasty. -Pain management as needed --d/c IV fluids -orthopedic consultation with Dr. 110 appreciated --PT/OT recs rehab    Essential tremor -Continue carbidopa   Dementia-Advanced -Appears stable without behavioral changes    Anemia -Hemoglobin 11.8 --post surgery 9.9 -baseline 12.4 a couple months ago -Continue to monitor    Hypothyroidism -Continue  levothyroxine  Underweight, suspect moderate protein calorie malnutrition -BMI 16.14 -Nutritionist evaluation    DVT prophylaxis: lovenox code Status: DNR Family Communication: Niece  Confirms patient is DNR Disposition Plan: TBD Consults called: Dr. Joice Lofts Status: Inpatient    Dispo: The patient is from: Home              Anticipated d/c is to: TBD              Anticipated d/c date is: 2 days              Patient currently   Patient admitted with mechanical hip fracture. She is POD #  2 seen by PT today recommends rehab  [facility] informed for discharge  planning patient will discharged to rehab once bed available.     TOTAL TIME TAKING CARE OF THIS PATIENT: 22 minutes.  >50% time spent on counselling and coordination of care  Note: This dictation was prepared with Dragon dictation along with smaller phrase technology. Any transcriptional errors that result from this process are unintentional.  [name] M.D    [facility]   CC: Primary care physician; Johnna Acosta, NP (Inactive)Patient ID: [name] [name], female   DOB: 1918-02-23, 86 y.o.   MRN: [id number]

## 2020-11-08 NOTE — Evaluation (Addendum)
Physical Therapy Evaluation Patient Details Name: [name] [name] MRN: [id number] DOB: [birth year] Today's Date: 11/08/2020   History of Present Illness  [name] [name] is a 86 y.o. female with a history of hypothyroidism and an essential tremor with poor balance who is otherwise in good health.  The patient lives with her niece independently.  Apparently, she ambulates with a cane but refuses to use a [place] despite her poor balance.  The patient was in her usual state of health last evening when she apparently got up to go the bathroom, lost her balance, and fell onto her right hip.  The patient was brought to the emergency room where x-rays demonstrated a displaced right femoral neck fracture.  The patient denies any associated injuries.  She did not strike her head or lose consciousness.  It is difficult to determine if she suffered any lightheadedness, dizziness, shortness of breath, or other symptoms contributing to her fall, given that she is a poor historian.    Clinical Impression  Pt is a 86 year old F admitted to hospital on 11/06/20 for R hip fx after mechanical fall at home. Pt s/p R hip unipolar hemiarthroplasty on 11/26 by Dr. Joice Lofts. Pt is WBAT on RLE and PT implemented posterior hip precautions for pain management. Pt questionable historian of health due to advanced dementia and current AMS, therefore PT unable to obtain subjective/PLOF information. Due to impaired cognition, pt was only oriented to herself and was able to inconsistently follow <50% of simple one step commands. Pt presents with confusion, increased pain, generalized weakness, essential tremors (at baseline), decreased balance, increased O2 dependence, and decreased activity tolerance resulting in impaired functional mobility. Pt able to partake in bed level mobility for positioning and self care ADL's due to urinary incontinence. Due to deficits, pt required max assist - total assist +2 for bed mobility and supine<>sit  transfers. Further mobility limited secondary to pt cognition, increased pain, and increased assistance levels. Deficits limit the pt's ability to safely and independently perform ADL's, transfer, and ambulate. Pt will benefit from acute skilled PT services to address deficits for return to baseline function. At this time, PT recommends SNF with 24/7 care at DC to address deficits and improve overall safety with functional mobility prior to return home.     Follow Up Recommendations SNF;Supervision/Assistance - 24 hour    Equipment Recommendations  Rolling [place] with 5" wheels;3in1 (PT)    Recommendations for Other Services OT consult     Precautions / Restrictions Precautions Precautions: Fall;Posterior Hip Precaution Booklet Issued: No Precaution Comments: posterior hip precautions for pain management Restrictions Weight Bearing Restrictions: Yes RLE Weight Bearing: Weight bearing as tolerated      Mobility  Bed Mobility Overal bed mobility: Needs Assistance Bed Mobility: Rolling;Supine to Sit;Sit to Supine Rolling: Max assist   Supine to sit: +2 for physical assistance;Total assist Sit to supine: +2 for physical assistance;Total assist   General bed mobility comments: Max assist with multimodal cues for rolling. Total assist +2 for supine<>sit transfers with multimodal cues for safety. Pt required increased time/effort due to guarding and pain.    Transfers                 General transfer comment: Not assessed at this time due to increased assistance levels and AMS.  Ambulation/Gait             General Gait Details: Not assessed at this time due to increased assistance levels and AMS.  Stairs  Wheelchair Mobility    Modified Rankin (Stroke Patients Only)       Balance Overall balance assessment: Needs assistance Sitting-balance support: Bilateral upper extremity supported;Feet supported Sitting balance-Leahy Scale: Fair Sitting  balance - Comments: Initially poor seated balance, progressing to fair with time and cues for BUE support.       Standing balance comment: Not assessed at this time due to increased assistance levels and AMS.                             Pertinent Vitals/Pain Pain Assessment: Faces Faces Pain Scale: Hurts worst Pain Location: Unable to report, but believed to be R hip due to acuity of sx. Increased pain behaviors noted with mobility. Pain Descriptors / Indicators: Grimacing;Guarding;Moaning Pain Intervention(s): Monitored during session;Repositioned;Relaxation;Limited activity within patient's tolerance    Home Living Family/patient expects to be discharged to:: Private residence Living Arrangements: Other relatives (Niece) Available Help at Discharge: Family             Additional Comments: Unknown home set up due to pt AMS    Prior Function Level of Independence: Needs assistance         Comments: Unknown PLOF due to pt AMS. Per chart review, pt used cane for household ambulation     Hand Dominance        Extremity/Trunk Assessment   Upper Extremity Assessment Upper Extremity Assessment: Generalized weakness    Lower Extremity Assessment Lower Extremity Assessment: Generalized weakness    Cervical / Trunk Assessment Cervical / Trunk Assessment: Kyphotic  Communication   Communication: HOH  Cognition Arousal/Alertness: Awake/alert Behavior During Therapy: Restless;Agitated Overall Cognitive Status: History of cognitive impairments - at baseline                                 General Comments: Pt with hx of advanced dementia at baseline. Pt only oriented to herself and repeteadly asked PT where she was and why she was there, although PT had already answered questions. Pt able to inconsistently follow <50% of simple one step commands.      General Comments      Exercises Other Exercises Other Exercises: Pt required max for bil  rolling and total assist +2 for supine scooting, for self care ADL's and toileting (due to urinary incontinence) as well as positioning in bed. Other Exercises: Pt able to perform supine<>sit transfer with total assist +2 and max multimodal cues for safety.   Assessment/Plan    PT Assessment Patient needs continued PT services  PT Problem List Decreased strength;Decreased range of motion;Decreased activity tolerance;Decreased balance;Decreased mobility;Decreased cognition;Decreased safety awareness;Decreased knowledge of precautions;Cardiopulmonary status limiting activity;Pain       PT Treatment Interventions Gait training;Functional mobility training;Therapeutic activities;Therapeutic exercise;Balance training;Neuromuscular re-education;Cognitive remediation;Patient/family education    PT Goals (Current goals can be found in the Care Plan section)  Acute Rehab PT Goals PT Goal Formulation: Patient unable to participate in goal setting    Frequency BID   Barriers to discharge Other (comment) AMS and pain    Co-evaluation               AM-PAC PT "6 Clicks" Mobility  Outcome Measure Help needed turning from your back to your side while in a flat bed without using bedrails?: Total Help needed moving from lying on your back to sitting on the side of a flat bed without  using bedrails?: Total Help needed moving to and from a bed to a chair (including a wheelchair)?: Total Help needed standing up from a chair using your arms (e.g., wheelchair or bedside chair)?: Total Help needed to walk in hospital room?: Total Help needed climbing 3-5 steps with a railing? : Total 6 Click Score: 6    End of Session Equipment Utilized During Treatment: Oxygen (3L O2 via [place]) Activity Tolerance: Patient limited by pain Patient left: in bed;with call bell/phone within reach;with bed alarm set;with nursing/sitter in room Nurse Communication: Mobility status PT Visit Diagnosis: Unsteadiness on feet  (R26.81);Muscle weakness (generalized) (M62.81);History of falling (Z91.81);Pain Pain - Right/Left: Right Pain - part of body: Hip    Time: 0902-0927 PT Time Calculation (min) (ACUTE ONLY): 25 min   Charges:   PT Evaluation $PT Eval Moderate Complexity: 1 Mod PT Treatments $Therapeutic Activity: 8-22 mins        Vira Blanco, PT, DPT 12:04 PM,11/08/20

## 2020-11-08 NOTE — TOC Initial Note (Signed)
Transition of Care [facility]) - Initial/Assessment Note    Patient Details  Name: [name] [name] MRN: [id number] Date of Birth: Sep 30, 1918  [facility] CM/SW Contact:    Maud Deed, LCSW Phone Number: 11/08/2020, 3:53 PM  Clinical Narrative:                 CSW spoke with pt's niece Alona who is also her Legal Guardian. CSW explained the PT recommendations and she was agreeable to a bed search. Alona prefers for the pt to go to [facility] since she has other family members there and does not want pt to go to [facility]. CSW completed FL2 and PASRR and faxed out to [facility].   Expected Discharge Plan: Skilled Nursing Facility Barriers to Discharge: Continued Medical Work up   Patient Goals and CMS Choice Patient states their goals for this hospitalization and ongoing recovery are:: Legal guardian states to be ale to return home CMS Medicare.gov Compare Post Acute Care list provided to:: Legal Guardian Choice offered to / list presented to : [facility] POA / Guardian  Expected Discharge Plan and Services Expected Discharge Plan: Skilled Nursing Facility       Living arrangements for the past 2 months: Single Family Home                                      Prior Living Arrangements/Services Living arrangements for the past 2 months: Single Family Home Lives with:: Relatives Patient language and need for interpreter reviewed:: Yes        Need for Family Participation in Patient Care: Yes (Comment) Care giver support system in place?: Yes (comment) (family members)   Criminal Activity/Legal Involvement Pertinent to Current Situation/Hospitalization: No - Comment as needed  Activities of Daily Living      Permission Sought/Granted Permission sought to share information with : Other (comment) (disoriented)                Emotional Assessment Appearance:: Other (Comment Required (unable to assess) Attitude/Demeanor/Rapport: Unable to Assess Affect (typically  observed): Unable to Assess Orientation: :  (disoriented x4) Alcohol / Substance Use: Not Applicable Psych Involvement: No (comment)  Admission diagnosis:  Essential tremor [G25.0] Closed fracture of neck of right femur, initial encounter (HCC) [S72.001A] Closed right hip fracture, initial encounter (HCC) [S72.001A] Fall in home, initial encounter [W19.XXXA, Y92.009] Hypothyroidism, unspecified type [E03.9] Patient Active Problem List   Diagnosis Date Noted  . Alzheimer's dementia without behavioral disturbance (HCC)   . Closed displaced fracture of right femoral neck (HCC) 11/06/2020  . Fall at home 11/06/2020  . Preoperative clearance 11/06/2020  . Anemia 11/06/2020  . Closed right hip fracture, initial encounter (HCC) 11/06/2020  . Hypothyroidism 11/06/2020  . Underweight 11/06/2020  . Cognitive dysfunction 11/06/2020  . Hearing impairment 11/06/2020  . Essential tremor   . Failure to thrive in adult   . Hyponatremia   . Unsteady gait    PCP:  Johnna Acosta, NP (Inactive) Pharmacy:   [facility] [address] ([address]), [place] - [address] [place]) [place] [address] Phone: [phone] Fax: [phone]     Social Determinants of Health (SDOH) Interventions    Readmission Risk Interventions No flowsheet data found.

## 2020-11-09 DIAGNOSIS — S72001A Fracture of unspecified part of neck of right femur, initial encounter for closed fracture: Secondary | ICD-10-CM | POA: Diagnosis not present

## 2020-11-09 DIAGNOSIS — E039 Hypothyroidism, unspecified: Secondary | ICD-10-CM | POA: Diagnosis not present

## 2020-11-09 DIAGNOSIS — R636 Underweight: Secondary | ICD-10-CM | POA: Diagnosis not present

## 2020-11-09 DIAGNOSIS — W19XXXA Unspecified fall, initial encounter: Secondary | ICD-10-CM | POA: Diagnosis not present

## 2020-11-09 LAB — BASIC METABOLIC PANEL
Anion gap: 5 (ref 5–15)
BUN: 21 mg/dL (ref 8–23)
CO2: 27 mmol/L (ref 22–32)
Calcium: 8.3 mg/dL — ABNORMAL LOW (ref 8.9–10.3)
Chloride: 105 mmol/L (ref 98–111)
Creatinine, Ser: 0.53 mg/dL (ref 0.44–1.00)
GFR, Estimated: >60 mL/min (ref >60)
Glucose, Bld: 101 mg/dL — ABNORMAL HIGH (ref 70–99)
Potassium: 3.8 mmol/L (ref 3.5–5.1)
Sodium: 137 mmol/L (ref 135–145)

## 2020-11-09 MED ORDER — CARBIDOPA-LEVODOPA 25-100 MG PO TABS
1.0000 | ORAL_TABLET | Freq: Two times a day (BID) | ORAL | Status: DC
  Administered 2020-11-10 – 2020-11-13 (×8): 1 via ORAL
  Filled 2020-11-09 (×9): qty 1

## 2020-11-09 NOTE — Progress Notes (Signed)
[facility]  - [place] at [facility]   PATIENT NAME: [name] [name]    MR#:  [id number]  DATE OF BIRTH:  07-04-1918  SUBJECTIVE:  patient came in from home after having a mechanical fall. Niece [name] is the [facility]  Patient has advanced dementia at baseline according to the niece.  Patient awake and alert. She is out in the recliner watching television. No new issues per RN.   REVIEW OF SYSTEMS:   Review of Systems  Unable to perform ROS: Dementia   Tolerating Diet: yes Tolerating PT: recommends rehab DRUG ALLERGIES:  No Known Allergies  VITALS:  Blood pressure (!) 150/83, pulse 84, temperature 98.6 F (37 C), resp. rate 16, height 5\' 6"  (1.676 m), weight 45.5 kg, SpO2 99 %.  PHYSICAL EXAMINATION:   Physical Examlimited due to dementia  GENERAL:  86 y.o.-year-old patient lying in the bed with no acute distress.fraile HEENT: Head atraumatic, normocephalic. Oropharynx and nasopharynx clear. Dry lips LUNGS: Normal breath sounds bilaterally, no wheezing, rales, rhonchi. No use of accessory muscles of respiration.  CARDIOVASCULAR: S1, S2 normal. No murmurs, rubs, or gallops.  ABDOMEN: Soft, nontender, nondistended. Bowel sounds present. No organomegaly or mass.  EXTREMITIES: No cyanosis, clubbing or edema b/l.    NEUROLOGIC: unable to examine due to dementia--moves extremities well PSYCHIATRIC:  Advanced dementia SKIN: bruise over the ribs and back from fall  LABORATORY PANEL:  CBC Recent Labs  Lab 11/08/20 0446  WBC 12.7*  HGB 9.5*  HCT 28.3*  PLT 193    Chemistries  Recent Labs  Lab 11/06/20 0347 11/07/20 0428 11/09/20 0539  NA 134*   < > 137  K 4.0   < > 3.8  CL 99   < > 105  CO2 27   < > 27  GLUCOSE 97   < > 101*  BUN 25*   < > 21  CREATININE 0.74   < > 0.53  CALCIUM 8.8*   < > 8.3*  AST 30  --   --   ALT <5  --   --   ALKPHOS 97  --   --   BILITOT 0.5  --   --    < > = values in this interval not displayed.   Cardiac  Enzymes No results for input(s): TROPONINI in the last 168 hours. RADIOLOGY:  No results found. ASSESSMENT AND PLAN:   86 year old female with history of essential tremor, hypothyroidism, presenting with right hip fracture from an accidental fall    Closed displaced fracture of right femoral neck (HCC)   Accidental fall at home -POD # 3 s/p  Right hip unipolar hemiarthroplasty. -Pain management as needed --d/ced IV fluids -orthopedic consultation with Dr. 110 appreciated --PT/OT recs rehab    Essential tremor -Continue carbidopa   Dementia-Advanced -Appears stable without behavioral changes    Anemia -Hemoglobin 11.8 --post surgery 9.9 -baseline 12.4 a couple months ago -Continue to monitor    Hypothyroidism -Continue levothyroxine  Nutrition Status: Underweight Nutrition Problem: Increased nutrient needs Etiology: post-op healing (right hip fracture) Signs/Symptoms: estimated needs Interventions: Ensure Enlive (each supplement provides 350kcal and 20 grams of protein)       DVT prophylaxis: lovenox code Status: DNR Family Communication: Niece confirms patient is DNR prior to admission Spoke with Niece Joice Lofts 11/29 Disposition Plan: TBD Consults called: Dr. 12/29 Status: Inpatient    Dispo: The patient is from: Home              Anticipated  d/c is to: Rehab              Anticipated d/c date is: when bed available              Patient currently stable for d/c  Patient admitted with mechanical hip fracture. She is POD #  3   TOC informed for discharge planning patient will discharged to rehab once bed available.     TOTAL TIME TAKING CARE OF THIS PATIENT: 22 minutes.  >50% time spent on counselling and coordination of care  Note: This dictation was prepared with Dragon dictation along with smaller phrase technology. Any transcriptional errors that result from this process are unintentional.  [name] M.D    [facility]    CC: Primary care physician; Johnna Acosta, NP (Inactive)Patient ID: [name] [name], female   DOB: 20-Dec-1917, 86 y.o.   MRN: [id number]

## 2020-11-09 NOTE — Progress Notes (Signed)
Physical Therapy Treatment Patient Details Name: [name] [name] MRN: [id number] DOB: Nov 19, 1918 Today's Date: 11/09/2020    History of Present Illness [name] [name] is a 86 y.o. female with a history of hypothyroidism and an essential tremor with poor balance who is otherwise in good health.  The patient lives with her niece independently.  Apparently, she ambulates with a cane but refuses to use a [place] despite her poor balance.  The patient was in her usual state of health last evening when she apparently got up to go the bathroom, lost her balance, and fell onto her right hip.  The patient was brought to the emergency room where x-rays demonstrated a displaced right femoral neck fracture.  The patient denies any associated injuries.  She did not strike her head or lose consciousness.  It is difficult to determine if she suffered any lightheadedness, dizziness, shortness of breath, or other symptoms contributing to her fall, given that she is a poor historian.    PT Comments    Pt in chair, alert requested to go back to bed due to there not being anything on TV and wanting to take a nap. Pt also indicated she would eat if her food was cut up, but at end of session changed her mind, nurse tech informed. The patient demonstrated improved weight bearing on LEs this session, evidenced by need of only 1 person assist for transfers. Sit <> stand modA, and minA with RW assist to return to EOB. She was able to perform a few seated exercises with BUE support and visual cues. Returned to supine with all needs in reach (pt did roll with minA to allow for chuck change). The patient would benefit from further skilled PT intervention to continue to progress towards goals. Recommendation remains appropriate.      Follow Up Recommendations  SNF;Supervision/Assistance - 24 hour     Equipment Recommendations  Rolling [place] with 5" wheels;3in1 (PT)    Recommendations for Other Services OT consult      Precautions / Restrictions Precautions Precautions: Fall;Posterior Hip Precaution Booklet Issued: No Precaution Comments: posterior hip precautions for pain management Restrictions Weight Bearing Restrictions: Yes RLE Weight Bearing: Weight bearing as tolerated    Mobility  Bed Mobility Overal bed mobility: Needs Assistance Bed Mobility: Sit to Supine     Supine to sit: Mod assist;HOB elevated Sit to supine: Max assist   General bed mobility comments: assist for LE management and trunk control  Transfers Overall transfer level: Needs assistance Equipment used: Rolling Gill[place] (2 wheeled) Transfers: Sit to/from Stand Sit to Stand: Mod assist         General transfer comment: Pt able to stand after total assist to reposition at edge of recliner  Ambulation/Gait Ambulation/Gait assistance: Min assist;+2 physical assistance Gait Distance (Feet): 2 Feet Assistive device: Rolling [place]ripp (2 wheeled)       General Gait Details: Pt able to take a few steps from recliner to EOB, improved pt participation this session, able to perform with one person assist.   Stairs             Wheelchair Mobility    Modified Rankin (Stroke Patients Only)       Balance Overall balance assessment: Needs assistance Sitting-balance support: Feet supported Sitting balance-Leahy Scale: Fair Sitting balance - Comments: Initially poor seated balance, progressing to fair with time and cues for BUE support.     Standing balance-Leahy Scale: Poor Standing balance comment: reliant on RW support and at least minA  Cognition Arousal/Alertness: Awake/alert Behavior During Therapy: WFL for tasks assessed/performed Overall Cognitive Status: History of cognitive impairments - at baseline                                 General Comments: Pt able to report her name, location (as a hospital), and that she fell. PT oriented  pt to place,  situation, time      Exercises General Exercises - Lower Extremity Long Arc Quad: AAROM;Strengthening;Right;10 reps Toe Raises: AROM;Strengthening;Both;10 reps Heel Raises: AROM;Strengthening;Both;10 reps Other Exercises Other Exercises: pt assisted to [facility] for BM at pt request; two person assist for safety.    General Comments        Pertinent Vitals/Pain Pain Assessment: Faces Faces Pain Scale: Hurts worst Pain Location: Unable to report, but believed to be R hip due to acuity of sx. Increased pain behaviors noted with mobility. Pain Descriptors / Indicators: Grimacing;Guarding;Moaning;Crying Pain Intervention(s): Limited activity within patient's tolerance;Monitored during session;Repositioned;Patient requesting pain meds-RN notified    Home Living                      Prior Function            PT Goals (current goals can now be found in the care plan section) Progress towards PT goals: Progressing toward goals    Frequency    BID      PT Plan Current plan remains appropriate    Co-evaluation              AM-PAC PT "6 Clicks" Mobility   Outcome Measure  Help needed turning from your back to your side while in a flat bed without using bedrails?: A Lot Help needed moving from lying on your back to sitting on the side of a flat bed without using bedrails?: A Lot Help needed moving to and from a bed to a chair (including a wheelchair)?: A Lot Help needed standing up from a chair using your arms (e.g., wheelchair or bedside chair)?: A Lot Help needed to walk in hospital room?: A Lot Help needed climbing 3-5 steps with a railing? : Total 6 Click Score: 11    End of Session Equipment Utilized During Treatment: Oxygen;Gait belt Activity Tolerance: Patient limited by pain Patient left: in bed;with call bell/phone within reach;with bed alarm set Nurse Communication: Mobility status PT Visit Diagnosis: Unsteadiness on feet (R26.81);Muscle weakness  (generalized) (M62.81);History of falling (Z91.81);Pain Pain - Right/Left: Right Pain - part of body: Hip     Time: 0160-1093 PT Time Calculation (min) (ACUTE ONLY): 26 min  Charges:  $Therapeutic Exercise: 23-37 mins $Therapeutic Activity: 8-22 mins                     Olga Coaster PT, DPT 2:27 PM,11/09/20

## 2020-11-09 NOTE — Progress Notes (Signed)
Physical Therapy Treatment Patient Details Name: [name] [name] MRN: [id number] DOB: 01-05-1918 Today's Date: 11/09/2020    History of Present Illness [name] [name] is a 86 y.o. female with a history of hypothyroidism and an essential tremor with poor balance who is otherwise in good health.  The patient lives with her niece independently.  Apparently, she ambulates with a cane but refuses to use a [place] despite her poor balance.  The patient was in her usual state of health last evening when she apparently got up to go the bathroom, lost her balance, and fell onto her right hip.  The patient was brought to the emergency room where x-rays demonstrated a displaced right femoral neck fracture.  The patient denies any associated injuries.  She did not strike her head or lose consciousness.  It is difficult to determine if she suffered any lightheadedness, dizziness, shortness of breath, or other symptoms contributing to her fall, given that she is a poor historian.    PT Comments    Pt alert, HOH but oriented to name, location as a hospital, aware that she fell prior to her admission. PT reoriented pt to time, situation (including R hip surgery). Pt with improved mobility compared to prior sessions but still exhibited limitations in functional mobility, safety, as well as mod-severe pain signs/symptoms. Supine to sit with modA and sat EOB for several minutes to progress to sitting with BUE support and supervision. Sit <> stand performed twice with one person assist (modA) but ultimately required two person assist for safety to transfer to [facility] and recliner. minAx2 with RW for pt to take 2-3 steps. Constant assist for RW management/position. Pt up in chair with all needs in reach. The patient would benefit from further skilled PT intervention to continue to progress towards goals. Recommendation remains appropriate.     Follow Up Recommendations  SNF;Supervision/Assistance - 24 hour     Equipment  Recommendations  Rolling File with 5" wheels;3in1 (PT)    Recommendations for Other Services OT consult     Precautions / Restrictions Precautions Precautions: Fall;Posterior Hip Precaution Booklet Issued: No Precaution Comments: posterior hip precautions for pain management Restrictions Weight Bearing Restrictions: Yes RLE Weight Bearing: Weight bearing as tolerated    Mobility  Bed Mobility Overal bed mobility: Needs Assistance Bed Mobility: Supine to Sit     Supine to sit: Mod assist;HOB elevated     General bed mobility comments: assist for LE management and trunk elevation  Transfers Overall transfer level: Needs assistance Equipment used: Rolling [place] (2 wheeled) Transfers: Sit to/from Stand Sit to Stand: Mod assist;+2 safety/equipment         General transfer comment: sit <> stand from EOB x2 with 1 person assist, and then two person from EOB to [facility] to recliner for safety.  Ambulation/Gait Ambulation/Gait assistance: Min assist;+2 physical assistance Gait Distance (Feet): 3 Feet         General Gait Details: Pt able to take a few steps from [facility] to recliner, minAx2 throughout   Stairs             Wheelchair Mobility    Modified Rankin (Stroke Patients Only)       Balance Overall balance assessment: Needs assistance Sitting-balance support: Bilateral upper extremity supported;Feet supported Sitting balance-Leahy Scale: Fair Sitting balance - Comments: Initially poor seated balance, progressing to fair with time and cues for BUE support.     Standing balance-Leahy Scale: Poor Standing balance comment: reliant on RW support and at least minA  Cognition Arousal/Alertness: Awake/alert Behavior During Therapy: WFL for tasks assessed/performed Overall Cognitive Status: History of cognitive impairments - at baseline                                 General Comments: Pt able to report her  name, location (as a hospital), and that she fell. PT oriented  pt to place, situation, time      Exercises Other Exercises Other Exercises: pt assisted to [facility] for BM at pt request; two person assist for safety.    General Comments        Pertinent Vitals/Pain Pain Assessment: Faces Faces Pain Scale: Hurts whole lot Pain Location: Unable to report, but believed to be R hip due to acuity of sx. Increased pain behaviors noted with mobility. Pain Descriptors / Indicators: Grimacing;Guarding;Moaning;Crying Pain Intervention(s): Monitored during session;Repositioned;Patient requesting pain meds-RN notified;Limited activity within patient's tolerance    Home Living                      Prior Function            PT Goals (current goals can now be found in the care plan section) Progress towards PT goals: Progressing toward goals    Frequency    BID      PT Plan Current plan remains appropriate    Co-evaluation              AM-PAC PT "6 Clicks" Mobility   Outcome Measure  Help needed turning from your back to your side while in a flat bed without using bedrails?: A Lot Help needed moving from lying on your back to sitting on the side of a flat bed without using bedrails?: A Lot Help needed moving to and from a bed to a chair (including a wheelchair)?: A Lot Help needed standing up from a chair using your arms (e.g., wheelchair or bedside chair)?: A Lot Help needed to walk in hospital room?: A Lot Help needed climbing 3-5 steps with a railing? : Total 6 Click Score: 11    End of Session Equipment Utilized During Treatment: Oxygen;Gait belt Activity Tolerance: Patient limited by pain Patient left: with nursing/sitter in room;in chair;with chair alarm set Nurse Communication: Mobility status (tech informed of pt need for call bell) PT Visit Diagnosis: Unsteadiness on feet (R26.81);Muscle weakness (generalized) (M62.81);History of falling (Z91.81);Pain Pain  - Right/Left: Right Pain - part of body: Hip     Time: 0852-0921 PT Time Calculation (min) (ACUTE ONLY): 29 min  Charges:  $Therapeutic Exercise: 8-22 mins $Therapeutic Activity: 8-22 mins                     Olga Coaster PT, DPT 12:07 PM,11/09/20

## 2020-11-09 NOTE — Progress Notes (Signed)
Subjective: 3 Days Post-Op Procedure(s) (LRB): ARTHROPLASTY BIPOLAR HIP (HEMIARTHROPLASTY) (Right) Patient is much more responsive to questions this morning. Reports only mild pain in the right hip this morning. Patient is passing gas and states that she needs to go to the bathroom. Plan is for discharge to SNF when appropriate. Negative for chest pain and shortness of breath No recent fevers.  Objective: Vital signs in last 24 hours: Temp:  [97.7 F (36.5 C)-98 F (36.7 C)] 97.9 F (36.6 C) (11/29 0728) Pulse Rate:  [67-110] 67 (11/29 0728) Resp:  [15-20] 18 (11/29 0728) BP: (125-176)/(78-109) 149/83 (11/29 0728) SpO2:  [96 %-100 %] 100 % (11/29 0728)  Intake/Output from previous day:  Intake/Output Summary (Last 24 hours) at 11/09/2020 0741 Last data filed at 11/08/2020 2352 Gross per 24 hour  Intake 0 ml  Output 100 ml  Net -100 ml    Intake/Output this shift: No intake/output data recorded.  Labs: Recent Labs    11/07/20 0428 11/08/20 0446  HGB 9.9* 9.5*   Recent Labs    11/07/20 0428 11/08/20 0446  WBC 15.4* 12.7*  RBC 3.08* 2.96*  HCT 29.5* 28.3*  PLT 196 193   Recent Labs    11/08/20 0446 11/09/20 0539  NA 135 137  K 3.9 3.8  CL 106 105  CO2 22 27  BUN 29* 21  CREATININE 0.82 0.53  GLUCOSE 114* 101*  CALCIUM 8.3* 8.3*   No results for input(s): LABPT, INR in the last 72 hours.   EXAM General - Patient is Alert and Appropriate Extremity - ABD soft Sensation intact distally Intact pulses distally Dorsiflexion/Plantar flexion intact Incision: dressing C/D/I No cellulitis present Dressing/Incision - clean, dry, no drainage Motor Function - intact, moving foot and toes well on exam.   Past Medical History:  Diagnosis Date  . Essential tremor   . Failure to thrive in adult   . Hyponatremia   . Senile asthenia   . Thyroid disease   . Unsteady gait     Assessment/Plan: 3 Days Post-Op Procedure(s) (LRB): ARTHROPLASTY BIPOLAR HIP  (HEMIARTHROPLASTY) (Right) Principal Problem:   Closed displaced fracture of right femoral neck (HCC) Active Problems:   Essential tremor   Fall at home   Preoperative clearance   Anemia   Hypothyroidism   Underweight   Cognitive dysfunction   Hearing impairment   Alzheimer's dementia without behavioral disturbance (HCC)  Estimated body mass index is 16.19 kg/m as calculated from the following:   Height as of this encounter: 5\' 6"  (1.676 m).   Weight as of this encounter: 45.5 kg. Up with therapy   Patient is much more responsive this morning to questions. Vitals stable this AM, no recent fevers. Begin working on BM at this time. Care management to assist with discharge planning this AM. Will need SNF upon discharge.  Upon discharge continue Lovenox x 14 days. Follow-up with [facility] orthopaedics for staple removal on 11/20/20.  DVT Prophylaxis - Lovenox Weight-Bearing as tolerated to right leg  J. 14/10/21, PA-C [facility] Orthopaedic Surgery 11/09/2020, 7:41 AM

## 2020-11-10 DIAGNOSIS — W19XXXA Unspecified fall, initial encounter: Secondary | ICD-10-CM | POA: Diagnosis not present

## 2020-11-10 DIAGNOSIS — R636 Underweight: Secondary | ICD-10-CM | POA: Diagnosis not present

## 2020-11-10 DIAGNOSIS — E039 Hypothyroidism, unspecified: Secondary | ICD-10-CM | POA: Diagnosis not present

## 2020-11-10 DIAGNOSIS — S72001A Fracture of unspecified part of neck of right femur, initial encounter for closed fracture: Secondary | ICD-10-CM | POA: Diagnosis not present

## 2020-11-10 LAB — SURGICAL PATHOLOGY

## 2020-11-10 MED ORDER — HYDROCOD POLST-CPM POLST ER 10-8 MG/5ML PO SUER
5.0000 mL | Freq: Two times a day (BID) | ORAL | Status: DC | PRN
  Administered 2020-11-10 – 2020-11-11 (×2): 5 mL via ORAL
  Filled 2020-11-10 (×3): qty 5

## 2020-11-10 MED ORDER — MUPIROCIN 2 % EX OINT
1.0000 "application " | TOPICAL_OINTMENT | Freq: Two times a day (BID) | CUTANEOUS | Status: DC
  Administered 2020-11-10 – 2020-11-13 (×7): 1 via NASAL
  Filled 2020-11-10: qty 22

## 2020-11-10 NOTE — Progress Notes (Signed)
Subjective: 4 Days Post-Op Procedure(s) (LRB): ARTHROPLASTY BIPOLAR HIP (HEMIARTHROPLASTY) (Right) Patient is responsive, working with PT upon entering the room. Reports only mild pain in the right hip this morning. Plan for discharge to SNF when appropriate. Negative for chest pain and shortness of breath No recent fevers.  Objective: Vital signs in last 24 hours: Temp:  [97.6 F (36.4 C)-98.6 F (37 C)] 98.3 F (36.8 C) (11/30 1138) Pulse Rate:  [57-79] 79 (11/30 1138) Resp:  [16-17] 17 (11/30 1138) BP: (113-149)/(62-98) 123/76 (11/30 1138) SpO2:  [85 %-100 %] 100 % (11/30 1138)  Intake/Output from previous day:  Intake/Output Summary (Last 24 hours) at 11/10/2020 1337 Last data filed at 11/10/2020 0158 Gross per 24 hour  Intake --  Output 550 ml  Net -550 ml    Intake/Output this shift: No intake/output data recorded.  Labs: Recent Labs    11/08/20 0446  HGB 9.5*   Recent Labs    11/08/20 0446  WBC 12.7*  RBC 2.96*  HCT 28.3*  PLT 193   Recent Labs    11/08/20 0446 11/09/20 0539  NA 135 137  K 3.9 3.8  CL 106 105  CO2 22 27  BUN 29* 21  CREATININE 0.82 0.53  GLUCOSE 114* 101*  CALCIUM 8.3* 8.3*   No results for input(s): LABPT, INR in the last 72 hours.   EXAM General - Patient is Alert and Appropriate Extremity - ABD soft Sensation intact distally Intact pulses distally Dorsiflexion/Plantar flexion intact Incision: dressing C/D/I No cellulitis present Dressing/Incision - clean, dry, no drainage Motor Function - intact, moving foot and toes well on exam.   Past Medical History:  Diagnosis Date  . Essential tremor   . Failure to thrive in adult   . Hyponatremia   . Senile asthenia   . Thyroid disease   . Unsteady gait     Assessment/Plan: 4 Days Post-Op Procedure(s) (LRB): ARTHROPLASTY BIPOLAR HIP (HEMIARTHROPLASTY) (Right) Principal Problem:   Closed displaced fracture of right femoral neck (HCC) Active Problems:   Essential  tremor   Fall at home   Preoperative clearance   Anemia   Hypothyroidism   Underweight   Cognitive dysfunction   Hearing impairment   Alzheimer's dementia without behavioral disturbance (HCC)  Estimated body mass index is 16.19 kg/m as calculated from the following:   Height as of this encounter: 5\' 6"  (1.676 m).   Weight as of this encounter: 45.5 kg. Up with therapy   Vitals stable this AM, no recent fevers. Patient has had a BM. Plan for discharge to SNF when appropriate.  Upon discharge continue Lovenox x 14 days. Follow-up with [facility] orthopaedics for staple removal on 11/20/20.  DVT Prophylaxis - Lovenox Weight-Bearing as tolerated to right leg  J. 14/10/21, PA-C [facility] Orthopaedic Surgery 11/10/2020, 1:37 PM

## 2020-11-10 NOTE — Progress Notes (Signed)
[facility]  - [place] [facility]   PATIENT NAME: [name] [name]    MR#:  [id number]  DATE OF BIRTH:  September 02, 1918  SUBJECTIVE:  patient came in from home after having a mechanical fall. Niece [name] is the [facility]  Patient has advanced dementia at baseline according to the niece.  Patient awake and alert. She is out in the recliner watching television. No new issues per RN. Some cough (chronic per niece)  REVIEW OF SYSTEMS:   Review of Systems  Unable to perform ROS: Dementia   Tolerating Diet: yes Tolerating PT: recommends rehab DRUG ALLERGIES:  No Known Allergies  VITALS:  Blood pressure 123/76, pulse 79, temperature 98.3 F (36.8 C), resp. rate 17, height 5\' 6"  (1.676 m), weight 45.5 kg, SpO2 100 %.  PHYSICAL EXAMINATION:   Physical Examlimited due to dementia  GENERAL:  86 y.o.-year-old patient lying in the bed with no acute distress.fraile HEENT: Head atraumatic, normocephalic. Oropharynx and nasopharynx clear. Dry lips LUNGS: Normal breath sounds bilaterally, no wheezing, rales, rhonchi. No use of accessory muscles of respiration.  CARDIOVASCULAR: S1, S2 normal. No murmurs, rubs, or gallops.  ABDOMEN: Soft, nontender, nondistended. Bowel sounds present. No organomegaly or mass.  EXTREMITIES: No cyanosis, clubbing or edema b/l.    NEUROLOGIC: unable to examine due to dementia--moves extremities well PSYCHIATRIC:  Advanced dementia SKIN: bruise over the ribs and back from fall  LABORATORY PANEL:  CBC Recent Labs  Lab 11/08/20 0446  WBC 12.7*  HGB 9.5*  HCT 28.3*  PLT 193    Chemistries  Recent Labs  Lab 11/06/20 0347 11/07/20 0428 11/09/20 0539  NA 134*   < > 137  K 4.0   < > 3.8  CL 99   < > 105  CO2 27   < > 27  GLUCOSE 97   < > 101*  BUN 25*   < > 21  CREATININE 0.74   < > 0.53  CALCIUM 8.8*   < > 8.3*  AST 30  --   --   ALT <5  --   --   ALKPHOS 97  --   --   BILITOT 0.5  --   --    < > = values in this interval not  displayed.   Cardiac Enzymes No results for input(s): TROPONINI in the last 168 hours. RADIOLOGY:  No results found. ASSESSMENT AND PLAN:   86 year old female with history of essential tremor, hypothyroidism, presenting with right hip fracture from an accidental fall    Closed displaced fracture of right femoral neck (HCC)   Accidental fall at home -POD # 4 s/p  Right hip unipolar hemiarthroplasty. -Pain management as needed --d/ced IV fluids -orthopedic consultation with Dr. 110 appreciated --PT/OT recs rehab    Essential tremor -Continue carbidopa   Dementia-Advanced -Appears stable without behavioral changes    Anemia -Hemoglobin 11.8 --post surgery 9.9 -baseline 12.4 a couple months ago -Continue to monitor    Hypothyroidism -Continue levothyroxine  Nutrition Status: Underweight Nutrition Problem: Increased nutrient needs Etiology: post-op healing (right hip fracture) Signs/Symptoms: estimated needs Interventions: Ensure Enlive (each supplement provides 350kcal and 20 grams of protein)  COPD/Emphysema noted on CXR --prn nebs and cough meds -oxygen prn   DVT prophylaxis: lovenox code Status: DNR Family Communication: Niece confirms patient is DNR prior to admission Spoke with Niece Joice Lofts 11/29 Disposition Plan: TBD Consults called: Dr. 12/29 Status: Inpatient    Dispo: The patient is from: Home  Anticipated d/c is to: Rehab              Anticipated d/c date is: when bed available              Patient currently stable for d/c  Patient admitted with mechanical hip fracture. She is POD #  4   TOC informed for discharge planning--No beds available as of 11/10/20 patient will discharged to rehab once bed available.     TOTAL TIME TAKING CARE OF THIS PATIENT: 22 minutes.  >50% time spent on counselling and coordination of care  Note: This dictation was prepared with Dragon dictation along with smaller phrase technology. Any  transcriptional errors that result from this process are unintentional.  [name] M.D    [facility]   CC: Primary care physician; Johnna Acosta, NP (Inactive)Patient ID: [name] [name], female   DOB: 1918-09-13, 86 y.o.   MRN: [id number]

## 2020-11-10 NOTE — Progress Notes (Signed)
Physical Therapy Treatment Patient Details Name: [name] [name] MRN: [id number] DOB: 1918/04/26 Today's Date: 11/10/2020    History of Present Illness [name] [name] is a 86 y.o. female with a history of hypothyroidism and an essential tremor with poor balance who is otherwise in good health.  The patient lives with her niece independently.  Apparently, she ambulates with a cane but refuses to use a [place] despite her poor balance.  The patient was in her usual state of health last evening when she apparently got up to go the bathroom, lost her balance, and fell onto her right hip.  The patient was brought to the emergency room where x-rays demonstrated a displaced right femoral neck fracture.  The patient denies any associated injuries.  She did not strike her head or lose consciousness.  It is difficult to determine if she suffered any lightheadedness, dizziness, shortness of breath, or other symptoms contributing to her fall, given that she is a poor historian.    PT Comments    Patient alert, adamant about transferring to [facility], denied pain initially, but endorsed pain with lying flat in supine. The patient demonstrated improvement in mobility during ambulation, improved ability to navigate obstacles/RW management with less assistance, still requiring minA for safety and intermittent unsteadiness. Session focused on transfers as well as a few seated exercises, pt repositioned in bed with all needs in reach at end of session. The patient would benefit from further skilled PT intervention to continue to progress towards goals. Recommendation remains appropriate.       Follow Up Recommendations  SNF;Supervision/Assistance - 24 hour     Equipment Recommendations  Rolling [place] with 5" wheels;3in1 (PT)    Recommendations for Other Services OT consult     Precautions / Restrictions Precautions Precautions: Fall;Posterior Hip Precaution Booklet Issued: No Precaution Comments: posterior hip  precautions for pain management Restrictions Weight Bearing Restrictions: Yes RLE Weight Bearing: Weight bearing as tolerated    Mobility  Bed Mobility Overal bed mobility: Needs Assistance Bed Mobility: Sit to Supine     Supine to sit: Min assist Sit to supine: Min assist   General bed mobility comments: assist for LE management  Transfers Overall transfer level: Needs assistance Equipment used: Rolling [place] (2 wheeled) Transfers: Sit to/from Stand Sit to Stand: Min assist         General transfer comment: minA from [facility] and from recliner  Ambulation/Gait Ambulation/Gait assistance: Min guard;Min assist Gait Distance (Feet): 5 Feet Assistive device: Rolling [place] (2 wheeled)       General Gait Details: Pt with improved foot clearance and weight bearing this session, occasionally just CGA for short steps. Pt HR elevated and spO2 decreased with ea bout of ambulation.   Stairs             Wheelchair Mobility    Modified Rankin (Stroke Patients Only)       Balance Overall balance assessment: Needs assistance Sitting-balance support: Feet supported Sitting balance-Leahy Scale: Fair Sitting balance - Comments: fair sitting balance noted     Standing balance-Leahy Scale: Poor Standing balance comment: reliant on RW support                            Cognition Arousal/Alertness: Awake/alert Behavior During Therapy: WFL for tasks assessed/performed Overall Cognitive Status: History of cognitive impairments - at baseline  General Comments: Pt able to report her name, location (as a hospital), and that she fell. PT oriented  pt to place, situation, time      Exercises General Exercises - Lower Extremity Long Arc Quad: Strengthening;Right;Left;15 reps Heel Raises: AROM;Strengthening;Both;15 reps Mini-Sqauts: AROM;Strengthening;Both;15 reps Other Exercises Other Exercises: Pt assisted to [facility] at  pt request, minA. total assistance for pericare    General Comments        Pertinent Vitals/Pain Pain Assessment: Faces Faces Pain Scale: Hurts little more Pain Location: pt indicated pain while lying flat in supine, no other complaints of pain Pain Descriptors / Indicators: Guarding;Grimacing;Moaning Pain Intervention(s): Limited activity within patient's tolerance;Monitored during session;Repositioned    Home Living                      Prior Function            PT Goals (current goals can now be found in the care plan section) Progress towards PT goals: Progressing toward goals    Frequency    BID      PT Plan Current plan remains appropriate    Co-evaluation              AM-PAC PT "6 Clicks" Mobility   Outcome Measure  Help needed turning from your back to your side while in a flat bed without using bedrails?: A Lot Help needed moving from lying on your back to sitting on the side of a flat bed without using bedrails?: A Lot Help needed moving to and from a bed to a chair (including a wheelchair)?: A Lot Help needed standing up from a chair using your arms (e.g., wheelchair or bedside chair)?: A Little Help needed to walk in hospital room?: A Lot Help needed climbing 3-5 steps with a railing? : Total 6 Click Score: 12    End of Session Equipment Utilized During Treatment: Oxygen;Gait belt Activity Tolerance: Patient tolerated treatment well Patient left: with SCD's reapplied;in bed;with call bell/phone within reach;with bed alarm set Nurse Communication: Mobility status PT Visit Diagnosis: Unsteadiness on feet (R26.81);Muscle weakness (generalized) (M62.81);History of falling (Z91.81);Pain Pain - Right/Left: Right Pain - part of body: Hip     Time: 1331-1405 PT Time Calculation (min) (ACUTE ONLY): 34 min  Charges:  $Therapeutic Exercise: 8-22 mins $Therapeutic Activity: 8-22 mins                     Olga Coaster PT, DPT 2:52  PM,11/10/20

## 2020-11-10 NOTE — Progress Notes (Signed)
Physical Therapy Treatment Patient Details Name: [name] [name] MRN: [id number] DOB: [birth year] Today's Date: 11/10/2020    History of Present Illness [name] [name] is a 86 y.o. female with a history of hypothyroidism and an essential tremor with poor balance who is otherwise in good health.  The patient lives with her niece independently.  Apparently, she ambulates with a cane but refuses to use a Quayle despite her poor balance.  The patient was in her usual state of health last evening when she apparently got up to go the bathroom, lost her balance, and fell onto her right hip.  The patient was brought to the emergency room where x-rays demonstrated a displaced right femoral neck fracture.  The patient denies any associated injuries.  She did not strike her head or lose consciousness.  It is difficult to determine if she suffered any lightheadedness, dizziness, shortness of breath, or other symptoms contributing to her fall, given that she is a poor historian.    PT Comments    Patient alert, agreeable to PT. Did endorse that she was frustrated due to her situation, and extensive time taken to orient pt to situation and education about plan of care. Overall the patient demonstrated good progress towards goals today, but still limited by fatigue and exhibited unsteadiness with ambulation. She was able to transfer to EOB with minA and extended time. Fair sitting balance noted without assistance. Sit <> stand from EOB twice and BSC with minA and cues for hand placement to maximize safety. She was able to ambulate ~57ft in total with RW and minA ~70% of the time. Pt up in chair with all needs in reach at end of session.       Follow Up Recommendations  SNF;Supervision/Assistance - 24 hour     Equipment Recommendations  Rolling Fendley with 5" wheels;3in1 (PT)    Recommendations for Other Services OT consult     Precautions / Restrictions Precautions Precautions: Fall;Posterior  Hip Precaution Booklet Issued: No Precaution Comments: posterior hip precautions for pain management Restrictions Weight Bearing Restrictions: Yes RLE Weight Bearing: Weight bearing as tolerated    Mobility  Bed Mobility Overal bed mobility: Needs Assistance Bed Mobility: Sit to Supine     Supine to sit: Min assist     General bed mobility comments: assist for LE management and trunk control but significantly improved from last session  Transfers Overall transfer level: Needs assistance Equipment used: Rolling Metter (2 wheeled) Transfers: Sit to/from Stand Sit to Stand: Min assist         General transfer comment: pt able to stand from EOB twice, cues for proper set up  Ambulation/Gait Ambulation/Gait assistance: Min assist Gait Distance (Feet): 4 Feet Assistive device: Rolling Coluccio (2 wheeled)       General Gait Details: Pt with improved foot clearance and weight bearing session   Stairs             Wheelchair Mobility    Modified Rankin (Stroke Patients Only)       Balance Overall balance assessment: Needs assistance Sitting-balance support: Feet supported Sitting balance-Leahy Scale: Fair Sitting balance - Comments: fair sitting balance noted     Standing balance-Leahy Scale: Poor Standing balance comment: reliant on RW support and at least minA                            Cognition Arousal/Alertness: Awake/alert Behavior During Therapy: WFL for tasks assessed/performed Overall Cognitive Status: History  of cognitive impairments - at baseline                                 General Comments: Pt able to report her name, location (as a hospital), and that she fell. PT oriented  pt to place, situation, time      Exercises Other Exercises Other Exercises: Pt assisted to [facility] at pt request, minA. total assistance for pericare    General Comments        Pertinent Vitals/Pain Pain Assessment: Faces Faces Pain  Scale: Hurts a little bit Pain Location: pt denied pain at rest, or with activity, mild pain signs with mobility this treatment Pain Descriptors / Indicators: Guarding;Grimacing Pain Intervention(s): Limited activity within patient's tolerance;Monitored during session;Repositioned    Home Living                      Prior Function            PT Goals (current goals can now be found in the care plan section) Progress towards PT goals: Progressing toward goals    Frequency    BID      PT Plan Current plan remains appropriate    Co-evaluation              AM-PAC PT "6 Clicks" Mobility   Outcome Measure  Help needed turning from your back to your side while in a flat bed without using bedrails?: A Lot Help needed moving from lying on your back to sitting on the side of a flat bed without using bedrails?: A Lot Help needed moving to and from a bed to a chair (including a wheelchair)?: A Lot Help needed standing up from a chair using your arms (e.g., wheelchair or bedside chair)?: A Little Help needed to walk in hospital room?: A Lot Help needed climbing 3-5 steps with a railing? : Total 6 Click Score: 12    End of Session Equipment Utilized During Treatment: Oxygen;Gait belt Activity Tolerance: Patient tolerated treatment well Patient left: in chair;with chair alarm set;with call bell/phone within reach;with SCD's reapplied Nurse Communication: Mobility status PT Visit Diagnosis: Unsteadiness on feet (R26.81);Muscle weakness (generalized) (M62.81);History of falling (Z91.81);Pain Pain - Right/Left: Right Pain - part of body: Hip     Time: 2376-2831 PT Time Calculation (min) (ACUTE ONLY): 38 min  Charges:  $Therapeutic Exercise: 38-52 mins                     Olga Coaster PT, DPT 11:31 AM,11/10/20

## 2020-11-11 DIAGNOSIS — G250 Essential tremor: Secondary | ICD-10-CM | POA: Diagnosis not present

## 2020-11-11 DIAGNOSIS — S72001A Fracture of unspecified part of neck of right femur, initial encounter for closed fracture: Secondary | ICD-10-CM | POA: Diagnosis not present

## 2020-11-11 NOTE — Discharge Instructions (Signed)
Instructions after Hip Replacement     J. Derald Macleod, M.D.  Valeria Batman, PA-C     Dept. of Orthopaedics & Sports Medicine  [facility]  [address]  [place], [place]  [address]  Phone: [phone]   Fax: [phone]    DIET:  Drink plenty of non-alcoholic fluids.  Resume your normal diet. Include foods high in fiber.  ACTIVITY:   You may use crutches or a [place] with weight-bearing as tolerated, unless instructed otherwise.  You may be weaned off of the Mozley or crutches by your Physical Therapist.   Do NOT reach below the level of your knees or cross your legs until allowed.     Continue doing gentle exercises. Exercising will reduce the pain and swelling, increase motion, and prevent muscle weakness.    Please continue to use the TED compression stockings for 6 weeks. You may remove the stockings at night, but should reapply them in the morning.  Do not drive or operate any equipment until instructed.  WOUND CARE:   Continue to use ice packs periodically to reduce pain and swelling.  Keep the incision clean and dry.  You may bathe or shower after the staples are removed at the first office visit following surgery.  MEDICATIONS:  You may resume your regular medications.  Please take the pain medication as prescribed on the medication.  Do not take pain medication on an empty stomach.  You have been given a prescription for a blood thinner to prevent blood clots. Please take the medication as instructed. (NOTE: After completing a 2 week course of Lovenox, take one Enteric-coated aspirin once a day.)  Pain medications and iron supplements can cause constipation. Use a stool softener (Senokot or Colace) on a daily basis and a laxative (dulcolax or miralax) as needed.  Do not drive or drink alcoholic beverages when taking pain medications.  CALL THE OFFICE FOR:  Temperature above 101 degrees  Excessive bleeding or drainage on the  dressing.  Excessive swelling, coldness, or paleness of the toes.  Persistent nausea and vomiting.  FOLLOW-UP:   Follow-up with [facility] in 6 weeks.  Arrangements have been made for continuation of Physical Therapy (either home therapy or outpatient therapy).

## 2020-11-11 NOTE — Care Management Important Message (Signed)
Important Message  Patient Details  Name: [name] [name] MRN: [id number] Date of Birth: May 21, 1918   Medicare Important Message Given:  Yes     Olegario Messier A Sharran Caratachea 11/11/2020, 11:53 AM

## 2020-11-11 NOTE — Progress Notes (Signed)
Physical Therapy Treatment Patient Details Name: [name] [name] MRN: [id number] DOB: Jun 27, 1918 Today's Date: 11/11/2020    History of Present Illness [name] [name] is a 86 y.o. female with a history of hypothyroidism and an essential tremor with poor balance who is otherwise in good health.  The patient lives with her niece independently.  Apparently, she ambulates with a cane but refuses to use a Butrick despite her poor balance.  The patient was in her usual state of health last evening when she apparently got up to go the bathroom, lost her balance, and fell onto her right hip.  The patient was brought to the emergency room where x-rays demonstrated a displaced right femoral neck fracture.  The patient denies any associated injuries.  She did not strike her head or lose consciousness.  It is difficult to determine if she suffered any lightheadedness, dizziness, shortness of breath, or other symptoms contributing to her fall, given that she is a poor historian.    PT Comments    Patient easily wakes to touch, session focused on promoting ambulation distance. She was able to transfer with CGA-minA with RW, pt with heavier posterior lean, able to correct with verbal/tactile cues. Several bouts of forwards/backwards with RW and CGA-MinA for 2-3 instances of LOB. HR and spO2 monitored and placed on 3L for mobility due to desaturation to low 80s, returned to 2L at rest, spO2 >90% at end of session. The patient would benefit from further skilled PT intervention to continue to progress towards goals. Recommendation remains appropriate.       Follow Up Recommendations  SNF;Supervision/Assistance - 24 hour     Equipment Recommendations  Rolling Tatar with 5" wheels;3in1 (PT)    Recommendations for Other Services OT consult     Precautions / Restrictions Precautions Precautions: Fall;Posterior Hip Precaution Booklet Issued: No Precaution Comments: posterior hip precautions for pain  management Restrictions Weight Bearing Restrictions: Yes RLE Weight Bearing: Weight bearing as tolerated    Mobility  Bed Mobility Overal bed mobility: Needs Assistance Bed Mobility: Sit to Supine Rolling: Min assist         General bed mobility comments: assist for LE management  Transfers Overall transfer level: Needs assistance Equipment used: Rolling Frankie (2 wheeled) Transfers: Sit to/from Stand Sit to Stand: Min assist         General transfer comment: pt with posterior lean noted this afternoon, able to correct with tactile and verbal cues  Ambulation/Gait Ambulation/Gait assistance: Min guard;Min assist Gait Distance (Feet):  (72ft x3 bouts) Assistive device: Rolling Gauger (2 wheeled)       General Gait Details: Pt able to transfer from bed, and then perform 3 bouts of sit <> stand and ambulating 26ft forwards/backwards   Stairs             Wheelchair Mobility    Modified Rankin (Stroke Patients Only)       Balance Overall balance assessment: Needs assistance Sitting-balance support: Feet supported Sitting balance-Leahy Scale: Fair Sitting balance - Comments: fair sitting balance noted     Standing balance-Leahy Scale: Poor Standing balance comment: reliant on RW support or PT support                            Cognition Arousal/Alertness: Awake/alert Behavior During Therapy: WFL for tasks assessed/performed Overall Cognitive Status: History of cognitive impairments - at baseline  General Comments: pt able to report name behavior WFLs      Exercises      General Comments        Pertinent Vitals/Pain Pain Assessment: Faces Faces Pain Scale: Hurts a little bit Pain Location: while transitioning from sitting to standing Pain Descriptors / Indicators: Grimacing Pain Intervention(s): Limited activity within patient's tolerance;Monitored during session;Repositioned    Home  Living                      Prior Function            PT Goals (current goals can now be found in the care plan section) Progress towards PT goals: Progressing toward goals    Frequency    BID      PT Plan Current plan remains appropriate    Co-evaluation              AM-PAC PT "6 Clicks" Mobility   Outcome Measure  Help needed turning from your back to your side while in a flat bed without using bedrails?: A Lot Help needed moving from lying on your back to sitting on the side of a flat bed without using bedrails?: A Lot Help needed moving to and from a bed to a chair (including a wheelchair)?: A Lot Help needed standing up from a chair using your arms (e.g., wheelchair or bedside chair)?: A Little Help needed to walk in hospital room?: A Little Help needed climbing 3-5 steps with a railing? : Total 6 Click Score: 13    End of Session Equipment Utilized During Treatment: Oxygen;Gait belt Activity Tolerance: Patient tolerated treatment well Patient left: with SCD's reapplied;with call bell/phone within reach;with bed alarm set;in chair;with chair alarm set Nurse Communication: Mobility status PT Visit Diagnosis: Unsteadiness on feet (R26.81);Muscle weakness (generalized) (M62.81);History of falling (Z91.81);Pain Pain - Right/Left: Right Pain - part of body: Hip     Time: 6720-9470 PT Time Calculation (min) (ACUTE ONLY): 23 min  Charges:  $Therapeutic Exercise: 23-37 mins                     Olga Coaster PT, DPT 4:26 PM,11/11/20

## 2020-11-11 NOTE — Progress Notes (Signed)
Physical Therapy Treatment Patient Details Name: [name] [name] MRN: [id number] DOB: [birth year] Today's Date: 11/11/2020    History of Present Illness [name] [name] is a 86 y.o. female with a history of hypothyroidism and an essential tremor with poor balance who is otherwise in good health.  The patient lives with her niece independently.  Apparently, she ambulates with a cane but refuses to use a Schroth despite her poor balance.  The patient was in her usual state of health last evening when she apparently got up to go the bathroom, lost her balance, and fell onto her right hip.  The patient was brought to the emergency room where x-rays demonstrated a displaced right femoral neck fracture.  The patient denies any associated injuries.  She did not strike her head or lose consciousness.  It is difficult to determine if she suffered any lightheadedness, dizziness, shortness of breath, or other symptoms contributing to her fall, given that she is a poor historian.    PT Comments    Pt easily woken, denied pain at rest and after mobility, but exhibited moderate pain signs/symptoms with transfers. The patient was able to perform a few supine exercises with assist; and minA to transfer to EOB. Fair sitting balance noted. Pt requested to use BSC, minA to stand from EOB. With ambulation today pt able to predominantly due with CGA, occasional minA for steadying and RW assist. Pt able to increase ambulation today a small amount, limited by pt cough, HR, and spO2 readings (returned to [facility] with rest, on 2L via [place] initially, bumped to 3L for mobility, RN aware). The patient would benefit from further skilled PT intervention to continue to progress towards goals. Recommendation remains appropriate.       Follow Up Recommendations  SNF;Supervision/Assistance - 24 hour     Equipment Recommendations  Rolling Flammer with 5" wheels;3in1 (PT)    Recommendations for Other Services OT consult     Precautions  / Restrictions Precautions Precautions: Fall;Posterior Hip Precaution Booklet Issued: No Precaution Comments: posterior hip precautions for pain management Restrictions Weight Bearing Restrictions: Yes RLE Weight Bearing: Weight bearing as tolerated    Mobility  Bed Mobility Overal bed mobility: Needs Assistance Bed Mobility: Supine to Sit     Supine to sit: Min assist;HOB elevated        Transfers Overall transfer level: Needs assistance Equipment used: Rolling Laday (2 wheeled) Transfers: Sit to/from Stand Sit to Stand: Min assist         General transfer comment: minA from EOB, but from recliner and BSC CGA with BUE supported, some unsteadiness still noted  Ambulation/Gait Ambulation/Gait assistance: Min guard;Min assist Gait Distance (Feet): 5 Feet (5 ft, additional 3 feet after sitting in recliner.) Assistive device: Rolling Barradas (2 wheeled)       General Gait Details: Pt able to take several steps with CGA, minA occasionall for unsteadiness   Stairs             Wheelchair Mobility    Modified Rankin (Stroke Patients Only)       Balance Overall balance assessment: Needs assistance Sitting-balance support: Feet supported Sitting balance-Leahy Scale: Fair Sitting balance - Comments: fair sitting balance noted     Standing balance-Leahy Scale: Poor Standing balance comment: reliant on RW support                            Cognition Arousal/Alertness: Awake/alert Behavior During Therapy: WFL for tasks assessed/performed Overall  Cognitive Status: History of cognitive impairments - at baseline                                 General Comments: pt able to report her name, but asked the situation/date/location today.      Exercises General Exercises - Lower Extremity Heel Slides: AAROM;Strengthening;Right;10 reps Hip ABduction/ADduction: AAROM;Strengthening;Right;10 reps Other Exercises Other Exercises: Pt  transferred to [facility] with MinA for RW assist and steadying. totalA for pericare    General Comments        Pertinent Vitals/Pain Pain Assessment: Faces Faces Pain Scale: Hurts even more Pain Location: while transitioning from supine to sit and from EOB sit to stand Pain Descriptors / Indicators: Grimacing;Moaning;Constant Pain Intervention(s): Limited activity within patient's tolerance;Monitored during session;Repositioned    Home Living                      Prior Function            PT Goals (current goals can now be found in the care plan section) Progress towards PT goals: Progressing toward goals    Frequency    BID      PT Plan Current plan remains appropriate    Co-evaluation              AM-PAC PT "6 Clicks" Mobility   Outcome Measure  Help needed turning from your back to your side while in a flat bed without using bedrails?: A Lot Help needed moving from lying on your back to sitting on the side of a flat bed without using bedrails?: A Lot Help needed moving to and from a bed to a chair (including a wheelchair)?: A Lot Help needed standing up from a chair using your arms (e.g., wheelchair or bedside chair)?: A Little Help needed to walk in hospital room?: A Little Help needed climbing 3-5 steps with a railing? : Total 6 Click Score: 13    End of Session Equipment Utilized During Treatment: Oxygen;Gait belt Activity Tolerance: Patient tolerated treatment well Patient left: with SCD's reapplied;with call bell/phone within reach;with bed alarm set;in chair;with chair alarm set Nurse Communication: Mobility status PT Visit Diagnosis: Unsteadiness on feet (R26.81);Muscle weakness (generalized) (M62.81);History of falling (Z91.81);Pain Pain - Right/Left: Right Pain - part of body: Hip     Time: 3419-6222 PT Time Calculation (min) (ACUTE ONLY): 36 min  Charges:  $Therapeutic Exercise: 23-37 mins                     Olga Coaster PT,  DPT 10:23 AM,11/11/20

## 2020-11-11 NOTE — Progress Notes (Signed)
Subjective: 5 Days Post-Op Procedure(s) (LRB): ARTHROPLASTY BIPOLAR HIP (HEMIARTHROPLASTY) (Right) Patient is resting comfortably this AM, able to be awakened. Reports only mild pain in the right hip this morning. Plan for discharge to SNF when appropriate. Negative for chest pain and shortness of breath 99.5 temp last night.  Objective: Vital signs in last 24 hours: Temp:  [97.5 F (36.4 C)-99.5 F (37.5 C)] 97.5 F (36.4 C) (12/01 0431) Pulse Rate:  [61-85] 85 (12/01 0431) Resp:  [15-18] 15 (12/01 0431) BP: (123-151)/(74-99) 136/85 (12/01 0431) SpO2:  [85 %-100 %] 100 % (12/01 0431)  Intake/Output from previous day:  Intake/Output Summary (Last 24 hours) at 11/11/2020 0738 Last data filed at 11/11/2020 0433 Gross per 24 hour  Intake --  Output 300 ml  Net -300 ml    Intake/Output this shift: No intake/output data recorded.  Labs: No results for input(s): HGB in the last 72 hours. No results for input(s): WBC, RBC, HCT, PLT in the last 72 hours. Recent Labs    11/09/20 0539  NA 137  K 3.8  CL 105  CO2 27  BUN 21  CREATININE 0.53  GLUCOSE 101*  CALCIUM 8.3*   No results for input(s): LABPT, INR in the last 72 hours.   EXAM General - Patient is Alert and Appropriate Extremity - ABD soft Sensation intact distally Intact pulses distally Dorsiflexion/Plantar flexion intact Incision: dressing C/D/I No cellulitis present Dressing/Incision - clean, dry, no drainage Motor Function - intact, moving foot and toes well on exam.   Past Medical History:  Diagnosis Date  . Essential tremor   . Failure to thrive in adult   . Hyponatremia   . Senile asthenia   . Thyroid disease   . Unsteady gait     Assessment/Plan: 5 Days Post-Op Procedure(s) (LRB): ARTHROPLASTY BIPOLAR HIP (HEMIARTHROPLASTY) (Right) Principal Problem:   Closed displaced fracture of right femoral neck (HCC) Active Problems:   Essential tremor   Fall at home   Preoperative clearance    Anemia   Hypothyroidism   Underweight   Cognitive dysfunction   Hearing impairment   Alzheimer's dementia without behavioral disturbance (HCC)  Estimated body mass index is 16.19 kg/m as calculated from the following:   Height as of this encounter: 5\' 6"  (1.676 m).   Weight as of this encounter: 45.5 kg. Up with therapy   Vitals stable this AM. Patient has had a BM. Plan for discharge to SNF when appropriate.  Upon discharge continue Lovenox x 14 days. Staples can be removed by SNF on 11/20/20. Follow-up with [facility] in 6 weeks for x-rays of the right hip.  DVT Prophylaxis - Lovenox Weight-Bearing as tolerated to right leg  [facility]. [facility], PA-C [facility] 11/11/2020, 7:38 AM

## 2020-11-11 NOTE — Progress Notes (Signed)
Progress Note    [name] [name]  QZE:[id number] DOB: [birth year]  DOA: 11/06/2020 PCP: Johnna Acosta, NP (Inactive)      Brief Narrative:    Medical records reviewed and are as summarized below:  [name] [name] is a 86 y.o. female  with medical history significant for essential tremor on carbidopa, cognitive dysfunction, hypothyroidism, and hearing impairment who lived independently until 2 years ago.  Her niece moved in with her for around-the-clock care.  She was brought to the hospital after unwitnessed fall in the bathroom.  She complained of right hip pain.  Work-up revealed right hip fracture.  She was treated with analgesics.  She was seen by orthopedic surgeon and she underwent hemiarthroplasty on 11/07/2020.      Assessment/Plan:   Principal Problem:   Closed displaced fracture of right femoral neck (HCC) Active Problems:   Essential tremor   Fall at home   Preoperative clearance   Anemia   Hypothyroidism   Underweight   Cognitive dysfunction   Hearing impairment   Alzheimer's dementia without behavioral disturbance (HCC)   Nutrition Problem: Increased nutrient needs Etiology: post-op healing (right hip fracture)  Signs/Symptoms: estimated needs   Body mass index is 16.19 kg/m.  (Underweight)   Closed displaced right femoral neck fracture, s/p fall at home: s/p right hemiarthroplasty on 11/07/2020.  Analgesics as needed for pain.  Follow-up with orthopedic surgeon.  Continue Lovenox for DVT prophylaxis  Acute blood loss anemia: H&H is stable.  No indication for blood transfusion at this time.  Essential tremor: Continue carbidopa-levodopa  Advanced dementia: Continue supportive care  COPD/emphysema: Compensated.  Bronchodilators as needed.  Hypothyroidism: Continue Synthroid    Diet Order            DIET SOFT Room service appropriate? Yes; Fluid consistency: Thin  Diet effective now                     Consultants:  Orthopedic surgeon  Procedures:  Right hip hemiarthroplasty    Medications:   . carbidopa-levodopa  1 tablet Oral BID  . carbidopa-levodopa  2 tablet Oral QHS  . Chlorhexidine Gluconate Cloth  6 each Topical Daily  . docusate sodium  100 mg Oral BID  . enoxaparin (LOVENOX) injection  30 mg Subcutaneous Q24H  . feeding supplement  237 mL Oral Q24H  . levothyroxine  100 mcg Oral Q0600  . multivitamin with minerals  1 tablet Oral Daily  . mupirocin ointment  1 application Nasal BID  . primidone  50 mg Oral QHS  . cyanocobalamin  100 mcg Oral Daily   Continuous Infusions:   Anti-infectives (From admission, onward)   Start     Dose/Rate Route Frequency Ordered Stop   11/06/20 1600  ceFAZolin (ANCEF) IVPB 2g/100 mL premix        2 g 200 mL/hr over 30 Minutes Intravenous Every 6 hours 11/06/20 1508 11/07/20 0534   11/06/20 1024  ceFAZolin (ANCEF) 2-4 GM/100ML-% IVPB       Note to Pharmacy: Dellis Filbert   : cabinet override      11/06/20 1024 11/06/20 1111   11/06/20 0450  ceFAZolin (ANCEF) IVPB 2g/100 mL premix        2 g 200 mL/hr over 30 Minutes Intravenous 30 min pre-op 11/06/20 0453 11/06/20 1123             Family Communication/Anticipated D/C date and plan/Code Status   DVT prophylaxis: enoxaparin (LOVENOX) injection 30 mg Start:  11/07/20 1000 SCDs Start: 11/06/20 1509 Place TED hose Start: 11/06/20 1509     Code Status: DNR  Family Communication: None Disposition Plan:    Status is: Inpatient  Remains inpatient appropriate because:Unsafe d/c plan   Dispo: The patient is from: Home              Anticipated d/c is to: SNF              Anticipated d/c date is: 1 day              Patient currently is medically stable to d/c.           Subjective:   She is confused unable to provide adequate history.  Interval events noted.  Objective:    Vitals:   11/10/20 1926 11/10/20 2328 11/11/20 0431 11/11/20 0827   BP: (!) 131/95 (!) 151/99 136/85 (!) 133/94  Pulse: 61 75 85 72  Resp: 17 16 15 16   Temp: 98.2 F (36.8 C) 97.7 F (36.5 C) (!) 97.5 F (36.4 C) 98.4 F (36.9 C)  TempSrc: Oral Oral Oral   SpO2: 99% 97% 100% 97%  Weight:      Height:       No data found.   Intake/Output Summary (Last 24 hours) at 11/11/2020 1400 Last data filed at 11/11/2020 0433 Gross per 24 hour  Intake --  Output 300 ml  Net -300 ml   Filed Weights   11/06/20 0338 11/06/20 1027  Weight: 45.4 kg 45.5 kg    Exam:  GEN: NAD.  Sitting up in the chair. SKIN: Warm and dry EYES: EOMI ENT: MMM CV: RRR PULM: CTA B ABD: soft, ND, NT, +BS CNS: AAO x person and place, confused, non focal EXT: Mild right hip swelling and tenderness   Data Reviewed:   I have personally reviewed following labs and imaging studies:  Labs: Labs show the following:   Basic Metabolic Panel: Recent Labs  Lab 11/06/20 0347 11/06/20 0347 11/07/20 0428 11/07/20 0428 11/08/20 0446 11/09/20 0539  NA 134*  --  133*  --  135 137  K 4.0   < > 4.6   < > 3.9 3.8  CL 99  --  103  --  106 105  CO2 27  --  23  --  22 27  GLUCOSE 97  --  126*  --  114* 101*  BUN 25*  --  27*  --  29* 21  CREATININE 0.74  --  0.88  --  0.82 0.53  CALCIUM 8.8*  --  8.3*  --  8.3* 8.3*   < > = values in this interval not displayed.   GFR Estimated Creatinine Clearance: 26.9 mL/min (by C-G formula based on SCr of 0.53 mg/dL). Liver Function Tests: Recent Labs  Lab 11/06/20 0347  AST 30  ALT <5  ALKPHOS 97  BILITOT 0.5  PROT 6.3*  ALBUMIN 3.4*   No results for input(s): LIPASE, AMYLASE in the last 168 hours. No results for input(s): AMMONIA in the last 168 hours. Coagulation profile Recent Labs  Lab 11/06/20 0347  INR 1.0    CBC: Recent Labs  Lab 11/06/20 0347 11/07/20 0428 11/08/20 0446  WBC 6.8 15.4* 12.7*  NEUTROABS 3.6  --   --   HGB 11.8* 9.9* 9.5*  HCT 36.0 29.5* 28.3*  MCV 96.3 95.8 95.6  PLT 243 196 193    Cardiac Enzymes: No results for input(s): CKTOTAL, CKMB, CKMBINDEX, TROPONINI in the  last 168 hours. BNP (last 3 results) No results for input(s): PROBNP in the last 8760 hours. CBG: No results for input(s): GLUCAP in the last 168 hours. D-Dimer: No results for input(s): DDIMER in the last 72 hours. Hgb A1c: No results for input(s): HGBA1C in the last 72 hours. Lipid Profile: No results for input(s): CHOL, HDL, LDLCALC, TRIG, CHOLHDL, LDLDIRECT in the last 72 hours. Thyroid function studies: No results for input(s): TSH, T4TOTAL, T3FREE, THYROIDAB in the last 72 hours.  Invalid input(s): FREET3 Anemia work up: No results for input(s): VITAMINB12, FOLATE, FERRITIN, TIBC, IRON, RETICCTPCT in the last 72 hours. Sepsis Labs: Recent Labs  Lab 11/06/20 0347 11/07/20 0428 11/08/20 0446  WBC 6.8 15.4* 12.7*    Microbiology Recent Results (from the past 240 hour(s))  Resp Panel by RT-PCR (Flu A&B, Covid) Nasopharyngeal Swab     Status: None   Collection Time: 11/06/20  4:56 AM   Specimen: Nasopharyngeal Swab; Nasopharyngeal(NP) swabs in vial transport medium  Result Value Ref Range Status   SARS Coronavirus 2 by RT PCR NEGATIVE NEGATIVE Final    Comment: (NOTE) SARS-CoV-2 target nucleic acids are NOT DETECTED.  The SARS-CoV-2 RNA is generally detectable in upper respiratory specimens during the acute phase of infection. The lowest concentration of SARS-CoV-2 viral copies this assay can detect is 138 copies/mL. A negative result does not preclude SARS-Cov-2 infection and should not be used as the sole basis for treatment or other patient management decisions. A negative result may occur with  improper specimen collection/handling, submission of specimen other than nasopharyngeal swab, presence of viral mutation(s) within the areas targeted by this assay, and inadequate number of viral copies(<138 copies/mL). A negative result must be combined with clinical observations,  patient history, and epidemiological information. The expected result is Negative.  Fact Sheet for Patients:  BloggerCourse.comhttps://www.fda.gov/media/152166/download  Fact Sheet for Healthcare Providers:  SeriousBroker.ithttps://www.fda.gov/media/152162/download  This test is no t yet approved or cleared by the [place] FDA and  has been authorized for detection and/or diagnosis of SARS-CoV-2 by FDA under an Emergency Use Authorization (EUA). This EUA will remain  in effect (meaning this test can be used) for the duration of the COVID-19 declaration under Section 564(b)(1) of the Act, 21 U.S.C.section 360bbb-3(b)(1), unless the authorization is terminated  or revoked sooner.       Influenza A by PCR NEGATIVE NEGATIVE Final   Influenza B by PCR NEGATIVE NEGATIVE Final    Comment: (NOTE) The Xpert Xpress SARS-CoV-2/FLU/RSV plus assay is intended as an aid in the diagnosis of influenza from Nasopharyngeal swab specimens and should not be used as a sole basis for treatment. Nasal washings and aspirates are unacceptable for Xpert Xpress SARS-CoV-2/FLU/RSV testing.  Fact Sheet for Patients: BloggerCourse.comhttps://www.fda.gov/media/152166/download  Fact Sheet for Healthcare Providers: SeriousBroker.ithttps://www.fda.gov/media/152162/download  This test is not yet approved or cleared by the [place] FDA and has been authorized for detection and/or diagnosis of SARS-CoV-2 by FDA under an Emergency Use Authorization (EUA). This EUA will remain in effect (meaning this test can be used) for the duration of the COVID-19 declaration under Section 564(b)(1) of the Act, 21 U.S.C. section 360bbb-3(b)(1), unless the authorization is terminated or revoked.  Performed at [facility], [address] [place], [place] [address]   Urine culture     Status: None   Collection Time: 11/06/20  9:21 AM   Specimen: Urine, Random  Result Value Ref Range Status   Specimen Description   Final    URINE, RANDOM Performed  at [facility], [address] [place], [place] [address]    Special Requests   Final    NONE Performed at [facility], [address] [place], [place] [address]    Culture   Final    NO GROWTH Performed at [facility], [address] [place], [place] [address]    Report Status 11/08/2020 FINAL  Final  Surgical pcr screen     Status: Abnormal   Collection Time: 11/06/20  9:23 AM   Specimen: Nasal Mucosa; Nasal Swab  Result Value Ref Range Status   MRSA, PCR POSITIVE (A) NEGATIVE Final    Comment: RESULT CALLED TO, READ BACK BY AND VERIFIED WITH:  BET BUJIANG AT 1050 11/06/20 SDR    Staphylococcus aureus POSITIVE (A) NEGATIVE Final    Comment: (NOTE) The Xpert SA Assay (FDA approved for NASAL specimens in patients 110 years of age and older), is one component of a comprehensive surveillance program. It is not intended to diagnose infection nor to guide or monitor treatment. Performed at [facility], [address] [place], [place] [address]     Procedures and diagnostic studies:  No results found.             LOS: 5 days   Gergory Biello  [facility]   Pager on [email or web address]. If 7PM-7AM, please contact night-coverage at [email or web address]     11/11/2020, 2:00 PM

## 2020-11-12 ENCOUNTER — Inpatient Hospital Stay: Payer: Medicare Other

## 2020-11-12 DIAGNOSIS — S72001A Fracture of unspecified part of neck of right femur, initial encounter for closed fracture: Secondary | ICD-10-CM | POA: Diagnosis not present

## 2020-11-12 LAB — CBC
HCT: 29.4 % — ABNORMAL LOW (ref 36.0–46.0)
Hemoglobin: 10 g/dL — ABNORMAL LOW (ref 12.0–15.0)
MCH: 32.5 pg (ref 26.0–34.0)
MCHC: 34 g/dL (ref 30.0–36.0)
MCV: 95.5 fL (ref 80.0–100.0)
Platelets: 260 10*3/uL (ref 150–400)
RBC: 3.08 MIL/uL — ABNORMAL LOW (ref 3.87–5.11)
RDW: 14.5 % (ref 11.5–15.5)
WBC: 7 10*3/uL (ref 4.0–10.5)
nRBC: 0 % (ref 0.0–0.2)

## 2020-11-12 LAB — GLUCOSE, CAPILLARY: Glucose-Capillary: 142 mg/dL — ABNORMAL HIGH (ref 70–99)

## 2020-11-12 LAB — CREATININE, SERUM
Creatinine, Ser: 0.54 mg/dL (ref 0.44–1.00)
GFR, Estimated: >60 mL/min (ref >60)

## 2020-11-12 MED ORDER — ENSURE ENLIVE PO LIQD
237.0000 mL | Freq: Three times a day (TID) | ORAL | Status: DC
  Administered 2020-11-12 – 2020-11-13 (×4): 237 mL via ORAL

## 2020-11-12 NOTE — Progress Notes (Signed)
Subjective: 6 Days Post-Op Procedure(s) (LRB): ARTHROPLASTY BIPOLAR HIP (HEMIARTHROPLASTY) (Right) Patient is resting comfortably this AM. Reports only mild pain in the right hip this morning. Plan for discharge to SNF when appropriate. Negative for chest pain and shortness of breath No recent fevers  Objective: Vital signs in last 24 hours: Temp:  [97.5 F (36.4 C)-98.2 F (36.8 C)] 97.7 F (36.5 C) (12/02 0830) Pulse Rate:  [60-90] 82 (12/02 0830) Resp:  [14-16] 16 (12/02 0830) BP: (112-139)/(58-93) 136/84 (12/02 0830) SpO2:  [95 %-99 %] 99 % (12/02 0830)  Intake/Output from previous day:  Intake/Output Summary (Last 24 hours) at 11/12/2020 1256 Last data filed at 11/12/2020 0433 Gross per 24 hour  Intake 240 ml  Output 150 ml  Net 90 ml    Intake/Output this shift: No intake/output data recorded.  Labs: Recent Labs    11/12/20 0500  HGB 10.0*   Recent Labs    11/12/20 0500  WBC 7.0  RBC 3.08*  HCT 29.4*  PLT 260   Recent Labs    11/12/20 0500  CREATININE 0.54   No results for input(s): LABPT, INR in the last 72 hours.   EXAM General - Patient is Alert and Appropriate Extremity - ABD soft Sensation intact distally Intact pulses distally Dorsiflexion/Plantar flexion intact Incision: dressing C/D/I No cellulitis present Dressing/Incision - clean, dry, no drainage Motor Function - intact, moving foot and toes well on exam.   Past Medical History:  Diagnosis Date  . Essential tremor   . Failure to thrive in adult   . Hyponatremia   . Senile asthenia   . Thyroid disease   . Unsteady gait     Assessment/Plan: 6 Days Post-Op Procedure(s) (LRB): ARTHROPLASTY BIPOLAR HIP (HEMIARTHROPLASTY) (Right) Principal Problem:   Closed displaced fracture of right femoral neck (HCC) Active Problems:   Essential tremor   Fall at home   Preoperative clearance   Anemia   Hypothyroidism   Underweight   Cognitive dysfunction   Hearing impairment    Alzheimer's dementia without behavioral disturbance (HCC)  Estimated body mass index is 16.19 kg/m as calculated from the following:   Height as of this encounter: 5\' 6"  (1.676 m).   Weight as of this encounter: 45.5 kg. Up with therapy   Vitals stable this AM. Patient has had a BM. Plan for discharge to SNF when appropriate.  Upon discharge continue Lovenox x 14 days. Staples can be removed by SNF on 11/20/20. Follow-up with [facility] in 6 weeks for x-rays of the right hip.  DVT Prophylaxis - Lovenox Weight-Bearing as tolerated to right leg  [facility], PA-C [facility] 11/12/2020, 12:56 PM

## 2020-11-12 NOTE — Plan of Care (Signed)
  Problem: Clinical Measurements: Goal: Will remain free from infection Outcome: Progressing Goal: Diagnostic test results will improve Outcome: Progressing Goal: Respiratory complications will improve Outcome: Progressing Goal: Cardiovascular complication will be avoided Outcome: Progressing   

## 2020-11-12 NOTE — Progress Notes (Signed)
Progress Note    [name] [name]  RUE:[id number] DOB: 06-28-1918  DOA: 11/06/2020 PCP: Johnna Acosta, NP (Inactive)      Brief Narrative:    Medical records reviewed and are as summarized below:  [name] [name] is a 86 y.o. female  with medical history significant for essential tremor on carbidopa,  dementia, hypothyroidism, COPD and hearing impairment who lived independently until 2 years ago.  Her niece moved in with her for around-the-clock care.  She was brought to the hospital after unwitnessed fall in the bathroom.  She complained of right hip pain.  Work-up revealed right hip fracture.  She was treated with analgesics.  She was seen by orthopedic surgeon and she underwent hemiarthroplasty on 11/07/2020.      Assessment/Plan:   Principal Problem:   Closed displaced fracture of right femoral neck (HCC) Active Problems:   Essential tremor   Fall at home   Preoperative clearance   Anemia   Hypothyroidism   Underweight   Cognitive dysfunction   Hearing impairment   Alzheimer's dementia without behavioral disturbance (HCC)   Nutrition Problem: Increased nutrient needs Etiology: post-op healing (right hip fracture)  Signs/Symptoms: estimated needs   Body mass index is 16.19 kg/m.  (Underweight)   Closed displaced right femoral neck fracture, s/p fall at home: s/p right hemiarthroplasty on 11/07/2020.  Analgesics as needed for pain.  Follow-up with orthopedic surgeon.  Continue Lovenox for DVT prophylaxis  Acute blood loss anemia: H&H is stable.  No indication for blood transfusion at this time.  Hypoxia: Suspect this may be chronic from COPD.  Obtain chest x-ray for further evaluation.  Essential tremor: Continue carbidopa-levodopa  Advanced dementia: Continue supportive care  COPD/emphysema: Compensated.  Bronchodilators as needed.  Hypothyroidism: Continue Synthroid    Diet Order            DIET SOFT Room service appropriate? Yes; Fluid  consistency: Thin  Diet effective now                    Consultants:  Orthopedic surgeon  Procedures:  Right hip hemiarthroplasty    Medications:   . carbidopa-levodopa  1 tablet Oral BID  . carbidopa-levodopa  2 tablet Oral QHS  . Chlorhexidine Gluconate Cloth  6 each Topical Daily  . docusate sodium  100 mg Oral BID  . enoxaparin (LOVENOX) injection  30 mg Subcutaneous Q24H  . feeding supplement  237 mL Oral Q24H  . levothyroxine  100 mcg Oral Q0600  . multivitamin with minerals  1 tablet Oral Daily  . mupirocin ointment  1 application Nasal BID  . primidone  50 mg Oral QHS  . cyanocobalamin  100 mcg Oral Daily   Continuous Infusions:   Anti-infectives (From admission, onward)   Start     Dose/Rate Route Frequency Ordered Stop   11/06/20 1600  ceFAZolin (ANCEF) IVPB 2g/100 mL premix        2 g 200 mL/hr over 30 Minutes Intravenous Every 6 hours 11/06/20 1508 11/07/20 0534   11/06/20 1024  ceFAZolin (ANCEF) 2-4 GM/100ML-% IVPB       Note to Pharmacy: Dellis Filbert   : cabinet override      11/06/20 1024 11/06/20 1111   11/06/20 0450  ceFAZolin (ANCEF) IVPB 2g/100 mL premix        2 g 200 mL/hr over 30 Minutes Intravenous 30 min pre-op 11/06/20 0453 11/06/20 1123  Family Communication/Anticipated D/C date and plan/Code Status   DVT prophylaxis: enoxaparin (LOVENOX) injection 30 mg Start: 11/07/20 1000 SCDs Start: 11/06/20 1509 Place TED hose Start: 11/06/20 1509     Code Status: DNR  Family Communication: Plan discussed with her niece, [name], at the bedside. Disposition Plan:    Status is: Inpatient  Remains inpatient appropriate because:Unsafe d/c plan   Dispo: The patient is from: Home              Anticipated d/c is to: SNF              Anticipated d/c date is: 1 day              Patient currently is medically stable to d/c.           Subjective:   Interval events noted.  Patient was working with physical  therapist when I walked into the room.  According to the physical therapist, oxygen saturation dropped to 79% with activity and she had to bump oxygen from 2 L/min to 3 L/min.  Her niece (Ms. Melvyn Neth, [place]) came to visit in the afternoon and she said patient always has a cough and she has had runny nose for years.  Objective:    Vitals:   11/11/20 2038 11/11/20 2347 11/12/20 0432 11/12/20 0830  BP: 122/76 139/81 (!) 112/58 136/84  Pulse: 87 60 90 82  Resp: 16 14 14 16   Temp: 98.2 F (36.8 C) 97.9 F (36.6 C) (!) 97.5 F (36.4 C) 97.7 F (36.5 C)  TempSrc: Oral Oral Oral Oral  SpO2: 97% 95% 99% 99%  Weight:      Height:       No data found.   Intake/Output Summary (Last 24 hours) at 11/12/2020 1235 Last data filed at 11/12/2020 0433 Gross per 24 hour  Intake 240 ml  Output 150 ml  Net 90 ml   Filed Weights   11/06/20 0338 11/06/20 1027  Weight: 45.4 kg 45.5 kg    Exam:  GEN: NAD SKIN: Warm and dry EYES: No pallor or icterus ENT: MMM CV: RRR PULM: CTA B ABD: soft, ND, NT, +BS CNS: AAO x 2 (person and place), confused, non focal EXT: Mild right hip swelling and tenderness.  Dressing on right hip surgical wound is intact.     Data Reviewed:   I have personally reviewed following labs and imaging studies:  Labs: Labs show the following:   Basic Metabolic Panel: Recent Labs  Lab 11/06/20 0347 11/06/20 0347 11/07/20 0428 11/07/20 0428 11/08/20 0446 11/09/20 0539 11/12/20 0500  NA 134*  --  133*  --  135 137  --   K 4.0   < > 4.6   < > 3.9 3.8  --   CL 99  --  103  --  106 105  --   CO2 27  --  23  --  22 27  --   GLUCOSE 97  --  126*  --  114* 101*  --   BUN 25*  --  27*  --  29* 21  --   CREATININE 0.74  --  0.88  --  0.82 0.53 0.54  CALCIUM 8.8*  --  8.3*  --  8.3* 8.3*  --    < > = values in this interval not displayed.   GFR Estimated Creatinine Clearance: 26.9 mL/min (by C-G formula based on SCr of 0.54 mg/dL). Liver Function Tests: Recent Labs   Lab 11/06/20  0347  AST 30  ALT <5  ALKPHOS 97  BILITOT 0.5  PROT 6.3*  ALBUMIN 3.4*   No results for input(s): LIPASE, AMYLASE in the last 168 hours. No results for input(s): AMMONIA in the last 168 hours. Coagulation profile Recent Labs  Lab 11/06/20 0347  INR 1.0    CBC: Recent Labs  Lab 11/06/20 0347 11/07/20 0428 11/08/20 0446 11/12/20 0500  WBC 6.8 15.4* 12.7* 7.0  NEUTROABS 3.6  --   --   --   HGB 11.8* 9.9* 9.5* 10.0*  HCT 36.0 29.5* 28.3* 29.4*  MCV 96.3 95.8 95.6 95.5  PLT 243 196 193 260   Cardiac Enzymes: No results for input(s): CKTOTAL, CKMB, CKMBINDEX, TROPONINI in the last 168 hours. BNP (last 3 results) No results for input(s): PROBNP in the last 8760 hours. CBG: No results for input(s): GLUCAP in the last 168 hours. D-Dimer: No results for input(s): DDIMER in the last 72 hours. Hgb A1c: No results for input(s): HGBA1C in the last 72 hours. Lipid Profile: No results for input(s): CHOL, HDL, LDLCALC, TRIG, CHOLHDL, LDLDIRECT in the last 72 hours. Thyroid function studies: No results for input(s): TSH, T4TOTAL, T3FREE, THYROIDAB in the last 72 hours.  Invalid input(s): FREET3 Anemia work up: No results for input(s): VITAMINB12, FOLATE, FERRITIN, TIBC, IRON, RETICCTPCT in the last 72 hours. Sepsis Labs: Recent Labs  Lab 11/06/20 0347 11/07/20 0428 11/08/20 0446 11/12/20 0500  WBC 6.8 15.4* 12.7* 7.0    Microbiology Recent Results (from the past 240 hour(s))  Resp Panel by RT-PCR (Flu A&B, Covid) Nasopharyngeal Swab     Status: None   Collection Time: 11/06/20  4:56 AM   Specimen: Nasopharyngeal Swab; Nasopharyngeal(NP) swabs in vial transport medium  Result Value Ref Range Status   SARS Coronavirus 2 by RT PCR NEGATIVE NEGATIVE Final    Comment: (NOTE) SARS-CoV-2 target nucleic acids are NOT DETECTED.  The SARS-CoV-2 RNA is generally detectable in upper respiratory specimens during the acute phase of infection. The  lowest concentration of SARS-CoV-2 viral copies this assay can detect is 138 copies/mL. A negative result does not preclude SARS-Cov-2 infection and should not be used as the sole basis for treatment or other patient management decisions. A negative result may occur with  improper specimen collection/handling, submission of specimen other than nasopharyngeal swab, presence of viral mutation(s) within the areas targeted by this assay, and inadequate number of viral copies(<138 copies/mL). A negative result must be combined with clinical observations, patient history, and epidemiological information. The expected result is Negative.  Fact Sheet for Patients:  BloggerCourse.com  Fact Sheet for Healthcare Providers:  SeriousBroker.it  This test is no t yet approved or cleared by the [place] FDA and  has been authorized for detection and/or diagnosis of SARS-CoV-2 by FDA under an Emergency Use Authorization (EUA). This EUA will remain  in effect (meaning this test can be used) for the duration of the COVID-19 declaration under Section 564(b)(1) of the Act, 21 U.S.C.section 360bbb-3(b)(1), unless the authorization is terminated  or revoked sooner.       Influenza A by PCR NEGATIVE NEGATIVE Final   Influenza B by PCR NEGATIVE NEGATIVE Final    Comment: (NOTE) The Xpert Xpress SARS-CoV-2/FLU/RSV plus assay is intended as an aid in the diagnosis of influenza from Nasopharyngeal swab specimens and should not be used as a sole basis for treatment. Nasal washings and aspirates are unacceptable for Xpert Xpress SARS-CoV-2/FLU/RSV testing.  Fact Sheet for Patients: BloggerCourse.com  Fact  Sheet for Healthcare Providers: SeriousBroker.ithttps://www.fda.gov/media/152162/download  This test is not yet approved or cleared by the [place] FDA and has been authorized for detection and/or diagnosis of SARS-CoV-2 by FDA under  an Emergency Use Authorization (EUA). This EUA will remain in effect (meaning this test can be used) for the duration of the COVID-19 declaration under Section 564(b)(1) of the Act, 21 U.S.C. section 360bbb-3(b)(1), unless the authorization is terminated or revoked.  Performed at [facility], [address] [place], [place] [address]   Urine culture     Status: None   Collection Time: 11/06/20  9:21 AM   Specimen: Urine, Random  Result Value Ref Range Status   Specimen Description   Final    URINE, RANDOM Performed at [facility], [address] [place], [place] [address]    Special Requests   Final    NONE Performed at [facility], [address] [place], [place] [address]    Culture   Final    NO GROWTH Performed at [facility], [address] [place], [place] [address]    Report Status 11/08/2020 FINAL  Final  Surgical pcr screen     Status: Abnormal   Collection Time: 11/06/20  9:23 AM   Specimen: Nasal Mucosa; Nasal Swab  Result Value Ref Range Status   MRSA, PCR POSITIVE (A) NEGATIVE Final    Comment: RESULT CALLED TO, READ BACK BY AND VERIFIED WITH:  BET BUJIANG AT 1050 11/06/20 SDR    Staphylococcus aureus POSITIVE (A) NEGATIVE Final    Comment: (NOTE) The Xpert SA Assay (FDA approved for NASAL specimens in patients 84 years of age and older), is one component of a comprehensive surveillance program. It is not intended to diagnose infection nor to guide or monitor treatment. Performed at [facility], [address] [place], [place] [address]     Procedures and diagnostic studies:  No results found.             LOS: 6 days   Westlyn Glaza  [facility]   Pager on [email or web address]. If 7PM-7AM, please contact night-coverage at [email or web address]     11/12/2020, 12:35 PM

## 2020-11-12 NOTE — Progress Notes (Signed)
Physical Therapy Treatment Patient Details Name: [name] [name] MRN: [id number] DOB: 1917-12-16 Today's Date: 11/12/2020    History of Present Illness [name] [name] is a 86 y.o. female with a history of hypothyroidism and an essential tremor with poor balance who is otherwise in good health.  The patient lives with her niece independently.  Apparently, she ambulates with a cane but refuses to use a Jech despite her poor balance.  The patient was in her usual state of health last evening when she apparently got up to go the bathroom, lost her balance, and fell onto her right hip.  The patient was brought to the emergency room where x-rays demonstrated a displaced right femoral neck fracture.  The patient denies any associated injuries.  She did not strike her head or lose consciousness.  It is difficult to determine if she suffered any lightheadedness, dizziness, shortness of breath, or other symptoms contributing to her fall, given that she is a poor historian.    PT Comments    Patient awake, oriented to her name but requested to be oriented to location/situation. Able to recall that she fell and had surgery, but did not understand why she was still in the hospital. Pt denied pain at rest, but exhibited pain signs with sit <> stand and bed mobility. The patient continued to need at least minA for mobility and ambulation due to intermittently unsteadiness and posterior lean. Pt up in chair at end of session with all needs in reach and tech at bedside to assist with breakfast.     Follow Up Recommendations  SNF;Supervision/Assistance - 24 hour     Equipment Recommendations  Rolling Soto with 5" wheels;3in1 (PT)    Recommendations for Other Services OT consult     Precautions / Restrictions Precautions Precautions: Fall;Posterior Hip Precaution Booklet Issued: No Precaution Comments: posterior hip precautions per op note Restrictions Weight Bearing Restrictions: Yes RLE Weight  Bearing: Weight bearing as tolerated    Mobility  Bed Mobility Overal bed mobility: Needs Assistance Bed Mobility: Supine to Sit     Supine to sit: Min assist;HOB elevated     General bed mobility comments: assist for LE management and trunk elevation due to increased pain this AM  Transfers Overall transfer level: Needs assistance Equipment used: Rolling Boateng (2 wheeled) Transfers: Sit to/from Stand Sit to Stand: Min assist         General transfer comment: with BUE support pt able to complete sit <> stand with less assist but ultimately still minA for steadying due to posterior lean as well as RW stabilization  Ambulation/Gait Ambulation/Gait assistance: Min guard;Min assist Gait Distance (Feet):  (2 ft to BSC, 3 ft to recliner) Assistive device: Rolling Kitko (2 wheeled)       General Gait Details: from EOB to [facility] to recliner   Stairs             Wheelchair Mobility    Modified Rankin (Stroke Patients Only)       Balance Overall balance assessment: Needs assistance Sitting-balance support: Feet supported Sitting balance-Leahy Scale: Fair Sitting balance - Comments: fair sitting balance noted     Standing balance-Leahy Scale: Poor Standing balance comment: reliant on RW support or PT support                            Cognition Arousal/Alertness: Awake/alert Behavior During Therapy: WFL for tasks assessed/performed Overall Cognitive Status: History of cognitive impairments - at baseline  General Comments: Pt able to report her name, but asked to be oriented to location, and why she was in the hospital; aware that thanksgiving had passed but disoriented to date      Exercises Other Exercises Other Exercises: transferred to [facility] to urinate, totalA for pericare Other Exercises: Pt gown and socks changed with PT assist    General Comments        Pertinent Vitals/Pain Pain Assessment:  Faces Faces Pain Scale: Hurts little more Pain Location: with transitional movements Pain Descriptors / Indicators: Grimacing;Moaning;Crying Pain Intervention(s): Limited activity within patient's tolerance;Monitored during session;Repositioned    Home Living                      Prior Function            PT Goals (current goals can now be found in the care plan section) Progress towards PT goals: Progressing toward goals    Frequency    BID      PT Plan Current plan remains appropriate    Co-evaluation              AM-PAC PT "6 Clicks" Mobility   Outcome Measure  Help needed turning from your back to your side while in a flat bed without using bedrails?: A Lot Help needed moving from lying on your back to sitting on the side of a flat bed without using bedrails?: A Lot Help needed moving to and from a bed to a chair (including a wheelchair)?: A Lot Help needed standing up from a chair using your arms (e.g., wheelchair or bedside chair)?: A Little Help needed to walk in hospital room?: A Little Help needed climbing 3-5 steps with a railing? : Total 6 Click Score: 13    End of Session Equipment Utilized During Treatment: Oxygen;Gait belt Activity Tolerance: Patient tolerated treatment well Patient left: with SCD's reapplied;with call bell/phone within reach;with bed alarm set;in chair;with chair alarm set Nurse Communication: Mobility status PT Visit Diagnosis: Unsteadiness on feet (R26.81);Muscle weakness (generalized) (M62.81);History of falling (Z91.81);Pain Pain - Right/Left: Right Pain - part of body: Hip     Time: 8675-4492 PT Time Calculation (min) (ACUTE ONLY): 36 min  Charges:  $Therapeutic Exercise: 23-37 mins                     Olga Coaster PT, DPT 10:05 AM,11/12/20

## 2020-11-12 NOTE — Progress Notes (Signed)
Physical Therapy Treatment Patient Details Name: [name] [name] MRN: [id number] DOB: 09-05-1918 Today's Date: 11/12/2020    History of Present Illness [name] [name] is a 86 y.o. female with a history of hypothyroidism and an essential tremor with poor balance who is otherwise in good health.  The patient lives with her niece independently.  Apparently, she ambulates with a cane but refuses to use a Krienke despite her poor balance.  The patient was in her usual state of health last evening when she apparently got up to go the bathroom, lost her balance, and fell onto her right hip.  The patient was brought to the emergency room where x-rays demonstrated a displaced right femoral neck fracture.  The patient denies any associated injuries.  She did not strike her head or lose consciousness.  It is difficult to determine if she suffered any lightheadedness, dizziness, shortness of breath, or other symptoms contributing to her fall, given that she is a poor historian.    PT Comments    Pt easily woken, in bed at start of session. Exhibited and reported moderate pain signs/symptoms with session, RN notified of pt request for pain medication. Session focused on functional mobility. Sit <> stand from EOB and BSC, modA from EOB but from [facility] with BUE support able to perform with very light minA for steady. She ambulate from EOB to [facility] ~44ft, from [facility] to EOB ~78ft, and from EOB forwards/backwards ~53ft total. varying levels of assist with RW, CGA-minA pt with posterior lean noted intermittently. when cued for RW use and to step by step cues, improved balance noted. Returned to supine with all needs in reach. The patient would benefit from further skilled PT intervention to continue to progress towards goals. Recommendation remains appropriate.      Follow Up Recommendations  SNF;Supervision/Assistance - 24 hour     Equipment Recommendations  Rolling Reali with 5" wheels;3in1 (PT)    Recommendations for  Other Services OT consult     Precautions / Restrictions Precautions Precautions: Fall;Posterior Hip Precaution Booklet Issued: No Precaution Comments: posterior hip precautions per op note Restrictions Weight Bearing Restrictions: Yes RLE Weight Bearing: Weight bearing as tolerated    Mobility  Bed Mobility Overal bed mobility: Needs Assistance Bed Mobility: Supine to Sit;Sit to Supine     Supine to sit: Min assist;HOB elevated Sit to supine: Mod assist   General bed mobility comments: LE management and trunk control to return to supine this PM  Transfers Overall transfer level: Needs assistance Equipment used: Rolling Panjwani (2 wheeled) Transfers: Sit to/from Stand Sit to Stand: Mod assist;Min assist         General transfer comment: modA from EOB, minA from [facility] for steadying for mild balance deficits upon standing  Ambulation/Gait Ambulation/Gait assistance: Min guard;Min assist   Assistive device: Rolling Zinger (2 wheeled)   Gait velocity: decreased   General Gait Details: from EOB to [facility] ~30ft. from [facility] to EOB ~52ft. From EOB forwards/backwards ~73ft total. varying levels of assist, CGA-minA pt with posterior lean noted intermittently. when cued for RW use and to step by step cues, improved balance noted.   Stairs             Wheelchair Mobility    Modified Rankin (Stroke Patients Only)       Balance Overall balance assessment: Needs assistance Sitting-balance support: Feet supported Sitting balance-Leahy Scale: Fair Sitting balance - Comments: fair sitting balance noted     Standing balance-Leahy Scale: Poor Standing balance comment: reliant  on RW support or PT support                            Cognition Arousal/Alertness: Awake/alert Behavior During Therapy: WFL for tasks assessed/performed Overall Cognitive Status: History of cognitive impairments - at baseline                                 General Comments:  Pt oriented to name, place, situation this PM, as well as month/date      Exercises Other Exercises Other Exercises: EOB <> BSC at pt request    General Comments        Pertinent Vitals/Pain Pain Assessment: Faces Faces Pain Scale: Hurts whole lot Pain Location: with transitional movements Pain Descriptors / Indicators: Grimacing;Moaning;Crying Pain Intervention(s): Limited activity within patient's tolerance;Monitored during session;Repositioned;Patient requesting pain meds-RN notified    Home Living                      Prior Function            PT Goals (current goals can now be found in the care plan section) Progress towards PT goals: Progressing toward goals    Frequency    BID      PT Plan Current plan remains appropriate    Co-evaluation              AM-PAC PT "6 Clicks" Mobility   Outcome Measure  Help needed turning from your back to your side while in a flat bed without using bedrails?: A Lot Help needed moving from lying on your back to sitting on the side of a flat bed without using bedrails?: A Lot Help needed moving to and from a bed to a chair (including a wheelchair)?: A Lot Help needed standing up from a chair using your arms (e.g., wheelchair or bedside chair)?: A Little Help needed to walk in hospital room?: A Little Help needed climbing 3-5 steps with a railing? : Total 6 Click Score: 13    End of Session Equipment Utilized During Treatment: Oxygen;Gait belt Activity Tolerance: Patient tolerated treatment well Patient left: with SCD's reapplied;with call bell/phone within reach;with bed alarm set;in chair;with chair alarm set Nurse Communication: Mobility status PT Visit Diagnosis: Unsteadiness on feet (R26.81);Muscle weakness (generalized) (M62.81);History of falling (Z91.81);Pain Pain - Right/Left: Right Pain - part of body: Hip     Time: 3474-2595 PT Time Calculation (min) (ACUTE ONLY): 30 min  Charges:  $Gait  Training: 8-22 mins $Therapeutic Exercise: 8-22 mins                     Olga Coaster PT, DPT 3:43 PM,11/12/20

## 2020-11-12 NOTE — TOC Progression Note (Addendum)
Transition of Care [facility]) - Progression Note    Patient Details  Name: [name] [name] MRN: [id number] Date of Birth: [birth year]  Transition of Care [facility]) CM/SW Contact  Margarito Liner, LCSW Phone Number: 11/12/2020, 1:32 PM  Clinical Narrative:  [name] [name] is able to offer patient a bed if patient revokes her hospice services but will need documentation of this once complete. Left voicemail for niece.  2:36 pm: Per niece, hospice was revoked last week during surgery. She was getting hospice services through [facility]. CSW spoke with their representative and she will have the office send documentation showing services are revoked. Niece stated plan for transition to long-term care after rehab. CSW asked about private pay vs. Medicaid vs. Long-term care policy. Niece said she thinks her [employer] will cover a large chunk of the cost. CSW called admissions coordinator to notify. She will call niece to provide financial information for long-term care. T[employer]are does not cover long-term care. Patient has not had her COVID vaccines due to recommendation from her MD in [place].  Expected Discharge Plan: Skilled Nursing Facility Barriers to Discharge: Continued Medical Work up  Expected Discharge Plan and Services Expected Discharge Plan: Skilled Nursing Facility       Living arrangements for the past 2 months: Single Family Home                                       Social Determinants of Health (SDOH) Interventions    Readmission Risk Interventions No flowsheet data found.

## 2020-11-12 NOTE — Progress Notes (Signed)
Nutrition Follow-up  DOCUMENTATION CODES:   Severe malnutrition in context of chronic illness, Underweight  INTERVENTION:  Downgrade diet to dysphagia 3 (mechanical soft).  Continue Ensure Enlive po TID, each supplement provides 350 kcal and 20 grams of protein.  Provide Magic cup TID with meals, each supplement provides 290 kcal and 9 grams of protein. Patient prefers chocolate.  Continue MVI po daily.  NUTRITION DIAGNOSIS:   Severe Malnutrition related to chronic illness (dementia, COPD) as evidenced by severe fat depletion, severe muscle depletion.  New nutrition diagnosis.  GOAL:   Patient will meet greater than or equal to 90% of their needs  Progressing.  MONITOR:   PO intake, Supplement acceptance, Labs, Weight trends, I & O's  REASON FOR ASSESSMENT:   Consult Assessment of nutrition requirement/status (hip fracture)  ASSESSMENT:   86 year old female with PMHx of essential tremor on carbidopa, dementia, hypothyroidism, COPD, hearing impairment admitted after unwitnessed fall found to have right hip fracture.   11/26 s/p right hip unipolar hemiarthroplasty  Met with patient at bedside. She reports her appetite is not good. She reports she is having difficulty chewing. Patient amenable to downgraded diet that is easier to chew. MD increased Ensure order to TID today. Patient reports she can likely drink 3 per day. She has had 1/2 bottle so far today per RN. Patient ate 25% of her lunch but cannot recall how she did with breakfast. Per chart she ate 0% of dinner last night. She is also amenable to trying chocolate Magic Cup with her trays.  Medications reviewed and include: Colace 100 mg BID, levothyroxine, MVI, vitamin B12 100 micrograms daily.  Labs reviewed.  I/O: 150 mL UOP yesterday + 1 occurrence unmeasured UOP  No weight to trend since 11/26  Discussed with RN.   NUTRITION - FOCUSED PHYSICAL EXAM:    Most Recent Value  Orbital Region Severe  depletion  Upper Arm Region Severe depletion  Thoracic and Lumbar Region Severe depletion  Buccal Region Severe depletion  Temple Region Severe depletion  Clavicle Bone Region Severe depletion  Clavicle and Acromion Bone Region Severe depletion  Scapular Bone Region Severe depletion  Dorsal Hand Severe depletion  Patellar Region Moderate depletion  Anterior Thigh Region Moderate depletion  Posterior Calf Region Unable to assess  Edema (RD Assessment) None  Hair Reviewed  Eyes Reviewed  Mouth Reviewed  Skin Reviewed  Nails Reviewed     Diet Order:   Diet Order            DIET SOFT Room service appropriate? Yes; Fluid consistency: Thin  Diet effective now                EDUCATION NEEDS:   No education needs have been identified at this time  Skin:  Skin Assessment: Skin Integrity Issues: Skin Integrity Issues:: Incisions Incisions: closed incision right hip Other: N/A  Last BM:  11/10/2020 per chart  Height:   Ht Readings from Last 1 Encounters:  11/06/20 5' 6"  (1.676 m)   Weight:   Wt Readings from Last 1 Encounters:  11/06/20 45.5 kg   BMI:  Body mass index is 16.19 kg/m.  Estimated Nutritional Needs:   Kcal:  1400-1600  Protein:  70-80  Fluid:  >1.2 L  Jacklynn Barnacle, MS, RD, LDN Pager number available on Amion

## 2020-11-13 DIAGNOSIS — E43 Unspecified severe protein-calorie malnutrition: Secondary | ICD-10-CM | POA: Insufficient documentation

## 2020-11-13 DIAGNOSIS — S72001A Fracture of unspecified part of neck of right femur, initial encounter for closed fracture: Secondary | ICD-10-CM | POA: Diagnosis not present

## 2020-11-13 LAB — RESP PANEL BY RT-PCR (FLU A&B, COVID) ARPGX2
Influenza A by PCR: NEGATIVE
Influenza B by PCR: NEGATIVE
SARS Coronavirus 2 by RT PCR: NEGATIVE

## 2020-11-13 LAB — GLUCOSE, CAPILLARY
Glucose-Capillary: 98 mg/dL (ref 70–99)
Glucose-Capillary: 145 mg/dL — ABNORMAL HIGH (ref 70–99)

## 2020-11-13 MED ORDER — ACETAMINOPHEN 325 MG PO TABS: 650.0000 mg | ORAL_TABLET | Freq: Four times a day (QID) | ORAL | Status: AC | PRN

## 2020-11-13 NOTE — Plan of Care (Signed)
?  Problem: Clinical Measurements: ?Goal: Ability to maintain clinical measurements within normal limits will improve ?Outcome: Progressing ?Goal: Will remain free from infection ?Outcome: Progressing ?Goal: Diagnostic test results will improve ?Outcome: Progressing ?  ?

## 2020-11-13 NOTE — Progress Notes (Signed)
Physical Therapy Treatment Patient Details Name: [name] [name] MRN: [id number] DOB: [birth year] Today's Date: 11/13/2020    History of Present Illness [name] [name] is a 86 y.o. female with a history of hypothyroidism and an essential tremor with poor balance who is otherwise in good health.  The patient lives with her niece independently.  Apparently, she ambulates with a cane but refuses to use a [place]onte despite her poor balance.  The patient was in her usual state of health last evening when she apparently got up to go the bathroom, lost her balance, and fell onto her right hip.  The patient was brought to the emergency room where x-rays demonstrated a displaced right femoral neck fracture.  The patient denies any associated injuries.  She did not strike her head or lose consciousness.  It is difficult to determine if she suffered any lightheadedness, dizziness, shortness of breath, or other symptoms contributing to her fall, given that she is a poor historian.    PT Comments    Pt was long sitting in bed upon arriving with bed linens soaked from incontinent episodes. She is alert and conversational throughout session however confused. Conversations were repeated multiple times during session and therapist did best efforts to orient pt to situation. She does consistently follow commands with increased time. Was able to exit bed with min assist + max vcs. Pt yells out during transition however once situated at EOB did not. Stood from EOB to RW with min assist. Took 3 steps to recliner with slow antalgic step to pattern. Breakfast tray arrived and author assisted with set up. Due to patients tremors, requires assistance to eat. Will return later this date to progress gait distances and perform there ex. Pt will greatly benefit from continued skilled PT to address deficits and to improve pt's independence. RN aware of pt's abilities and cognition deficits. At conclusion of session, pt was in recliner with  call bell in hand , chair alarm in place, and breakfast tray placed in front of her.     Follow Up Recommendations  SNF;Supervision/Assistance - 24 hour     Equipment Recommendations  Rolling [place] with 5" wheels;3in1 (PT)    Recommendations for Other Services       Precautions / Restrictions Precautions Precautions: Fall;Posterior Hip Precaution Booklet Issued: No Precaution Comments: posterior hip precautions per op note Restrictions Weight Bearing Restrictions: Yes RLE Weight Bearing: Weight bearing as tolerated    Mobility  Bed Mobility Overal bed mobility: Needs Assistance Bed Mobility: Supine to Sit     Supine to sit: Min assist;HOB elevated     General bed mobility comments: Increased time to perform with min assist + max vcs for sequencing. Overall tolerated well but is very fearful during transition to sitting.  Transfers Overall transfer level: Needs assistance Equipment used: Rolling Stayer (2 wheeled) Transfers: Sit to/from Stand Sit to Stand: Min assist         General transfer comment: Min assist to stand from slightly elevated bed height. Vcs for hand placement and technique improvements  Ambulation/Gait Ambulation/Gait assistance: Min guard Gait Distance (Feet): 3 Feet Assistive device: Rolling Dorrough (2 wheeled) Gait Pattern/deviations: Step-to pattern;Antalgic;Trunk flexed Gait velocity: decreased   General Gait Details: pt easily able to ambulate from EOB to recliner with incraesed time and vcs for sequencing.       Balance Overall balance assessment: Needs assistance Sitting-balance support: Feet supported Sitting balance-Leahy Scale: Good Sitting balance - Comments: no LOB in sitting   Standing balance  support: Bilateral upper extremity supported;During functional activity Standing balance-Leahy Scale: Fair Standing balance comment: reliant on UE support for standing balance       Cognition Arousal/Alertness:  Awake/alert Behavior During Therapy: Anxious Overall Cognitive Status: History of cognitive impairments - at baseline    General Comments: Pt is alert but confused. She repeats conversations throughout session but was able to follow commands. Pt is incontinent of urine upon arrival             Pertinent Vitals/Pain Pain Assessment: Faces Faces Pain Scale: Hurts even more Pain Location: with transitional movements Pain Descriptors / Indicators: Grimacing;Moaning;Crying Pain Intervention(s): Limited activity within patient's tolerance;Monitored during session;Repositioned           PT Goals (current goals can now be found in the care plan section) Acute Rehab PT Goals Patient Stated Goal: I want to be able to do for myself again Progress towards PT goals: Progressing toward goals    Frequency    BID      PT Plan Current plan remains appropriate       AM-PAC PT "6 Clicks" Mobility   Outcome Measure  Help needed turning from your back to your side while in a flat bed without using bedrails?: A Lot Help needed moving from lying on your back to sitting on the side of a flat bed without using bedrails?: A Lot Help needed moving to and from a bed to a chair (including a wheelchair)?: A Lot Help needed standing up from a chair using your arms (e.g., wheelchair or bedside chair)?: A Little Help needed to walk in hospital room?: A Little Help needed climbing 3-5 steps with a railing? : A Lot 6 Click Score: 14    End of Session Equipment Utilized During Treatment: Oxygen;Gait belt Activity Tolerance: Patient tolerated treatment well Patient left: in chair;with call bell/phone within reach;with chair alarm set Nurse Communication: Mobility status PT Visit Diagnosis: Unsteadiness on feet (R26.81);Muscle weakness (generalized) (M62.81);History of falling (Z91.81);Pain Pain - Right/Left: Right Pain - part of body: Hip     Time: 1700-1749 PT Time Calculation (min) (ACUTE  ONLY): 23 min  Charges:  $Therapeutic Activity: 23-37 mins                     Jetta Lout PTA 11/13/20, 9:58 AM

## 2020-11-13 NOTE — Progress Notes (Signed)
Subjective: 7 Days Post-Op Procedure(s) (LRB): ARTHROPLASTY BIPOLAR HIP (HEMIARTHROPLASTY) (Right) Patient is resting comfortably this AM. Reports only mild pain in the right hip this morning. Plan for discharge to SNF when appropriate. Negative for chest pain and shortness of breath No recent fevers  Objective: Vital signs in last 24 hours: Temp:  [97.7 F (36.5 C)-98.7 F (37.1 C)] 98.7 F (37.1 C) (12/03 0818) Pulse Rate:  [54-110] 110 (12/03 0818) Resp:  [15-17] 15 (12/03 0334) BP: (100-132)/(60-89) 129/83 (12/03 0334) SpO2:  [90 %-99 %] 93 % (12/03 0818)  Intake/Output from previous day:  Intake/Output Summary (Last 24 hours) at 11/13/2020 0841 Last data filed at 11/12/2020 2121 Gross per 24 hour  Intake 570 ml  Output --  Net 570 ml    Intake/Output this shift: No intake/output data recorded.  Labs: Recent Labs    11/12/20 0500  HGB 10.0*   Recent Labs    11/12/20 0500  WBC 7.0  RBC 3.08*  HCT 29.4*  PLT 260   Recent Labs    11/12/20 0500  CREATININE 0.54   No results for input(s): LABPT, INR in the last 72 hours.   EXAM General - Patient is Alert and Appropriate Extremity - ABD soft Sensation intact distally Intact pulses distally Dorsiflexion/Plantar flexion intact Incision: dressing C/D/I No cellulitis present Dressing/Incision - clean, dry, no drainage Motor Function - intact, moving foot and toes well on exam.   Past Medical History:  Diagnosis Date  . Essential tremor   . Failure to thrive in adult   . Hyponatremia   . Senile asthenia   . Thyroid disease   . Unsteady gait     Assessment/Plan: 7 Days Post-Op Procedure(s) (LRB): ARTHROPLASTY BIPOLAR HIP (HEMIARTHROPLASTY) (Right) Principal Problem:   Closed displaced fracture of right femoral neck (HCC) Active Problems:   Essential tremor   Fall at home   Preoperative clearance   Anemia   Hypothyroidism   Underweight   Cognitive dysfunction   Hearing impairment    Alzheimer's dementia without behavioral disturbance (HCC)   Protein-calorie malnutrition, severe  Estimated body mass index is 16.19 kg/m as calculated from the following:   Height as of this encounter: 5\' 6"  (1.676 m).   Weight as of this encounter: 45.5 kg. Up with therapy   Vitals stable this AM. Patient has had a BM. Plan for discharge to SNF when appropriate.  Upon discharge continue Lovenox x 14 days. Staples can be removed by SNF on 11/20/20. Follow-up with [facility] in 6 weeks for x-rays of the right hip.  DVT Prophylaxis - Lovenox Weight-Bearing as tolerated to right leg  [facility], PA-C [facility] 11/13/2020, 8:41 AM

## 2020-11-13 NOTE — TOC Transition Note (Signed)
Transition of Care [facility]) - CM/SW Discharge Note   Patient Details  Name: [name] [name] MRN: [id number] Date of Birth: 1918/04/06  [facility] CM/SW Contact:  Liliana Cline, LCSW Phone Number: 11/13/2020, 11:12 AM   Clinical Narrative:   Patient to discharge to [facility] today, Room 510. Confirmed with Verlon Au at [employer]. CSW updated MD, RN, patient's HCPOA [place]. Asked for rapid COVID test prior to DC. Asked RN to call report and MD to submit DC Summary. Medical Necessity Form, DNR, and Face Sheet placed in Discharge Packet by patient chart. EMS transport arranged for 4:00 pick up time with [facility]. No other needs identified prior to discharge.     Final next level of care: Skilled Nursing Facility Barriers to Discharge: Barriers Resolved   Patient Goals and CMS Choice Patient states their goals for this hospitalization and ongoing recovery are:: SNF rehab CMS Medicare.gov Compare Post Acute Care list provided to:: Patient Represenative (must comment) Choice offered to / list presented to : [facility] POA / Guardian  Discharge Placement              Patient [facility] bed at: [facility] Patient to be transferred to facility by: [facility] Name of family member notified: [place] Patient and family notified of of transfer: 11/13/20  Discharge Plan and Services                                     Social Determinants of Health (SDOH) Interventions     Readmission Risk Interventions No flowsheet data found.

## 2020-11-13 NOTE — Discharge Summary (Signed)
Physician Discharge Summary  [name] [name] WJX:[id number] DOB: 02/10/1918 DOA: 11/06/2020  PCP: Johnna AcostaScarboro, Adam J, NP (Inactive)  Admit date: 11/06/2020 Discharge date: 11/13/2020  Discharge disposition: SNF   Recommendations for Outpatient Follow-Up:   1. Follow-up with physician at the next within 3 days of discharge. 2. Follow-up with orthopedic surgery PA, Blanchard ManeJames McGhee, in 6 weeks.    Discharge Diagnosis:   Principal Problem:   Closed displaced fracture of right femoral neck (HCC) Active Problems:   Essential tremor   Fall at home   Preoperative clearance   Anemia   Hypothyroidism   Underweight   Cognitive dysfunction   Hearing impairment   Alzheimer's dementia without behavioral disturbance (HCC)   Protein-calorie malnutrition, severe    Discharge Condition: Stable.  Diet recommendation:  Diet Order            Diet - low sodium heart healthy           DIET DYS 3 Room service appropriate? Yes with Assist; Fluid consistency: Thin  Diet effective now                   Code Status: DNR     Hospital Course:   [name] is a 86 y.o. female with medical history significant foressential tremoron carbidopa, dementia,hypothyroidism, COPDand hearing impairmentwho lived independently until 2 years ago.  Her niece moved in with her for around-the-clock care.  She was brought to the hospital after unwitnessed fall in the bathroom at home.  She complained of right hip pain.  Work-up revealed right hip fracture.  She was treated with analgesics.  She was seen by orthopedic surgeon and she underwent hemiarthroplasty on 11/07/2020.  She was evaluated by PT and OT who recommended further rehabilitation at a skilled nursing facility.  Of note, patient has chronic hypoxemic respiratory failure likely due to COPD.  She requires between 2 L/min (at rest) and up to 4 L/min (with activity) oxygen via nasal cannula. Her condition has improved and she is deemed  stable for discharge to SNF.     Medical Consultants:    Orthopedic surgeon, Dr. Joice LoftsPoggi   Discharge Exam:    Vitals:   11/12/20 2035 11/12/20 2339 11/13/20 0334 11/13/20 0818  BP:  111/62 129/83   Pulse:  77 63 (!) 110  Resp:  17 15   Temp:  98 F (36.7 C) 97.8 F (36.6 C) 98.7 F (37.1 C)  TempSrc:      SpO2: 98% 98% 97% 93%  Weight:      Height:         GEN: NAD SKIN: Warm and dry EYES: No pallor or icterus ENT: MMM CV: RRR PULM: CTA B ABD: soft, ND, NT, +BS CNS: AAO x 3, non focal EXT: Right hip swelling and tenderness.  Dressing on right hip surgical wound is clean and dry.   The results of significant diagnostics from this hospitalization (including imaging, microbiology, ancillary and laboratory) are listed below for reference.     Procedures and Diagnostic Studies:   CT HEAD WO CONTRAST  Result Date: 11/06/2020 CLINICAL DATA:  Initial evaluation for acute head trauma, minor. EXAM: CT HEAD WITHOUT CONTRAST TECHNIQUE: Contiguous axial images were obtained from the base of the skull through the vertex without intravenous contrast. COMPARISON:  None. FINDINGS: Brain: Generalized age-related cerebral atrophy with mild chronic small vessel ischemic disease. No acute intracranial hemorrhage. No acute large vessel territory infarct. Approximate 1 cm right parafalcine meningioma seen at the  anterior right frontal falx (series 3, image 12). No associated mass effect or edema. No other mass lesion, mass effect, or midline shift. No hydrocephalus or extra-axial fluid collection. Vascular: No hyperdense vessel. Scattered vascular calcifications noted within the carotid siphons. Skull: Scalp soft tissues demonstrate no acute finding. Calvarium intact. Lucent lesion at the left frontoparietal calvarium noted, indeterminate, but most likely benign. Sinuses/Orbits: Globes orbital soft tissues within normal limits. Mild scattered mucosal thickening noted within the sphenoid  ethmoidal sinuses. Paranasal sinuses are otherwise clear. No mastoid effusion. Other: None. IMPRESSION: 1. No acute intracranial abnormality. 2. 1 cm right parafalcine meningioma without associated mass effect or edema. 3. Generalized age-related cerebral atrophy with mild chronic small vessel ischemic disease. Electronically Signed   By: Rise Mu M.D.   On: 11/06/2020 04:23   CT CERVICAL SPINE WO CONTRAST  Result Date: 11/06/2020 CLINICAL DATA:  Initial evaluation for acute trauma, fall. EXAM: CT CERVICAL SPINE WITHOUT CONTRAST TECHNIQUE: Multidetector CT imaging of the cervical spine was performed without intravenous contrast. Multiplanar CT image reconstructions were also generated. COMPARISON:  None available. FINDINGS: Alignment: Straightening of the normal cervical lordosis. Trace anterolisthesis of C7 on T1-T1 on T2, chronic and facet mediated. Skull base and vertebrae: Skull base intact. Normal C1-2 articulations are preserved in the dens is intact. Vertebral body heights maintained. No acute fracture. Soft tissues and spinal canal: Soft tissues of the neck demonstrate no acute finding. No abnormal prevertebral edema. Spinal canal within normal limits. Prominent vascular calcifications about the carotid bifurcations. Disc levels: Mild cervical spondylosis present at C3-4 through C6-7. Upper chest: Visualized upper chest demonstrates no acute finding. Partially visualized lung apices are clear. Other: None. IMPRESSION: 1. No acute traumatic injury within the cervical spine. 2. Mild cervical spondylosis at C3-4 through C6-7. Electronically Signed   By: Rise Mu M.D.   On: 11/06/2020 04:42   DG Chest Portable 1 View  Result Date: 11/06/2020 CLINICAL DATA:  Preoperative right hip fracture EXAM: PORTABLE CHEST 1 VIEW COMPARISON:  None. FINDINGS: Heart size and pulmonary vascularity are normal. Emphysematous changes in the lungs. No airspace disease or consolidation. No pleural  effusions. No pneumothorax. Calcification of the aorta. Mediastinal contours appear intact. IMPRESSION: Emphysematous changes in the lungs. No evidence of active pulmonary disease. Electronically Signed   By: Burman Nieves M.D.   On: 11/06/2020 04:30   DG HIP UNILAT W OR W/O PELVIS 2-3 VIEWS RIGHT  Result Date: 11/06/2020 CLINICAL DATA:  Right hip replacement. EXAM: DG HIP (WITH OR WITHOUT PELVIS) 2-3V RIGHT COMPARISON:  Right hip x-rays from same day. FINDINGS: The right hip demonstrates a hemiarthroplasty without evidence of hardware failure or complication. There is no fracture or dislocation. The alignment is anatomic. Post-surgical changes and subcutaneous emphysema noted in the surrounding soft tissues. Prior left hip hemiarthroplasty. IMPRESSION: 1. Interval right hip hemiarthroplasty without acute postoperative complication. Electronically Signed   By: Obie Dredge M.D.   On: 11/06/2020 13:56   DG Hip Unilat With Pelvis 2-3 Views Right  Result Date: 11/06/2020 CLINICAL DATA:  Right hip pain after fall EXAM: DG HIP (WITH OR WITHOUT PELVIS) 2-3V RIGHT COMPARISON:  None. FINDINGS: Fracture of the right femoral neck with varus angulation. Small bone fragments are projected medially. Can not exclude extension into the lesser trochanter. The pelvis appears intact. Previous left hip hemiarthroplasty. Degenerative changes in the lower lumbar spine. Diffuse bone demineralization. IMPRESSION: Fracture of the right femoral neck with varus angulation. Small medial bone fragments are present. Can not  exclude extension into the lesser trochanter. Electronically Signed   By: Burman Nieves M.D.   On: 11/06/2020 04:29     Labs:   Basic Metabolic Panel: Recent Labs  Lab 11/07/20 0428 11/07/20 0428 11/08/20 0446 11/09/20 0539 11/12/20 0500  NA 133*  --  135 137  --   K 4.6   < > 3.9 3.8  --   CL 103  --  106 105  --   CO2 23  --  22 27  --   GLUCOSE 126*  --  114* 101*  --   BUN 27*  --   29* 21  --   CREATININE 0.88  --  0.82 0.53 0.54  CALCIUM 8.3*  --  8.3* 8.3*  --    < > = values in this interval not displayed.   GFR Estimated Creatinine Clearance: 26.9 mL/min (by C-G formula based on SCr of 0.54 mg/dL). Liver Function Tests: No results for input(s): AST, ALT, ALKPHOS, BILITOT, PROT, ALBUMIN in the last 168 hours. No results for input(s): LIPASE, AMYLASE in the last 168 hours. No results for input(s): AMMONIA in the last 168 hours. Coagulation profile No results for input(s): INR, PROTIME in the last 168 hours.  CBC: Recent Labs  Lab 11/07/20 0428 11/08/20 0446 11/12/20 0500  WBC 15.4* 12.7* 7.0  HGB 9.9* 9.5* 10.0*  HCT 29.5* 28.3* 29.4*  MCV 95.8 95.6 95.5  PLT 196 193 260   Cardiac Enzymes: No results for input(s): CKTOTAL, CKMB, CKMBINDEX, TROPONINI in the last 168 hours. BNP: Invalid input(s): POCBNP CBG: Recent Labs  Lab 11/12/20 2127 11/13/20 0815  GLUCAP 142* 98   D-Dimer No results for input(s): DDIMER in the last 72 hours. Hgb A1c No results for input(s): HGBA1C in the last 72 hours. Lipid Profile No results for input(s): CHOL, HDL, LDLCALC, TRIG, CHOLHDL, LDLDIRECT in the last 72 hours. Thyroid function studies No results for input(s): TSH, T4TOTAL, T3FREE, THYROIDAB in the last 72 hours.  Invalid input(s): FREET3 Anemia work up No results for input(s): VITAMINB12, FOLATE, FERRITIN, TIBC, IRON, RETICCTPCT in the last 72 hours. Microbiology Recent Results (from the past 240 hour(s))  Resp Panel by RT-PCR (Flu A&B, Covid) Nasopharyngeal Swab     Status: None   Collection Time: 11/06/20  4:56 AM   Specimen: Nasopharyngeal Swab; Nasopharyngeal(NP) swabs in vial transport medium  Result Value Ref Range Status   SARS Coronavirus 2 by RT PCR NEGATIVE NEGATIVE Final    Comment: (NOTE) SARS-CoV-2 target nucleic acids are NOT DETECTED.  The SARS-CoV-2 RNA is generally detectable in upper respiratory specimens during the acute phase of  infection. The lowest concentration of SARS-CoV-2 viral copies this assay can detect is 138 copies/mL. A negative result does not preclude SARS-Cov-2 infection and should not be used as the sole basis for treatment or other patient management decisions. A negative result may occur with  improper specimen collection/handling, submission of specimen other than nasopharyngeal swab, presence of viral mutation(s) within the areas targeted by this assay, and inadequate number of viral copies(<138 copies/mL). A negative result must be combined with clinical observations, patient history, and epidemiological information. The expected result is Negative.  Fact Sheet for Patients:  BloggerCourse.com  Fact Sheet for Healthcare Providers:  SeriousBroker.it  This test is no t yet approved or cleared by the [place] FDA and  has been authorized for detection and/or diagnosis of SARS-CoV-2 by FDA under an Emergency Use Authorization (EUA). This EUA will remain  in  effect (meaning this test can be used) for the duration of the COVID-19 declaration under Section 564(b)(1) of the Act, 21 U.S.C.section 360bbb-3(b)(1), unless the authorization is terminated  or revoked sooner.       Influenza A by PCR NEGATIVE NEGATIVE Final   Influenza B by PCR NEGATIVE NEGATIVE Final    Comment: (NOTE) The Xpert Xpress SARS-CoV-2/FLU/RSV plus assay is intended as an aid in the diagnosis of influenza from Nasopharyngeal swab specimens and should not be used as a sole basis for treatment. Nasal washings and aspirates are unacceptable for Xpert Xpress SARS-CoV-2/FLU/RSV testing.  Fact Sheet for Patients: BloggerCourse.com  Fact Sheet for Healthcare Providers: SeriousBroker.it  This test is not yet approved or cleared by the [place] FDA and has been authorized for detection and/or diagnosis of SARS-CoV-2  by FDA under an Emergency Use Authorization (EUA). This EUA will remain in effect (meaning this test can be used) for the duration of the COVID-19 declaration under Section 564(b)(1) of the Act, 21 U.S.C. section 360bbb-3(b)(1), unless the authorization is terminated or revoked.  Performed at [facility], [address] [place], [place] [address]   Urine culture     Status: None   Collection Time: 11/06/20  9:21 AM   Specimen: Urine, Random  Result Value Ref Range Status   Specimen Description   Final    URINE, RANDOM Performed at [facility], [address], [place], [place] [address]    Special Requests   Final    NONE Performed at [facility], [address], [place], [place] [address]    Culture   Final    NO GROWTH Performed at [facility], [address], [place], [place] [address]    Report Status 11/08/2020 FINAL  Final  Surgical pcr screen     Status: Abnormal   Collection Time: 11/06/20  9:23 AM   Specimen: Nasal Mucosa; Nasal Swab  Result Value Ref Range Status   MRSA, PCR POSITIVE (A) NEGATIVE Final    Comment: RESULT CALLED TO, READ BACK BY AND VERIFIED WITH:  BET BUJIANG AT 1050 11/06/20 SDR    Staphylococcus aureus POSITIVE (A) NEGATIVE Final    Comment: (NOTE) The Xpert SA Assay (FDA approved for NASAL specimens in patients 57 years of age and older), is one component of a comprehensive surveillance program. It is not intended to diagnose infection nor to guide or monitor treatment. Performed at [facility], [address] [place], [place] [address]      Discharge Instructions:   Discharge Instructions    Diet - low sodium heart healthy   Complete by: As directed    Discharge wound care:   Complete by: As directed    Keep wound clean and dry   Increase activity slowly   Complete by: As directed      Allergies as of 11/13/2020   No Known Allergies     Medication List    STOP taking  these medications   LORazepam 0.5 MG tablet Commonly known as: ATIVAN     TAKE these medications   acetaminophen 325 MG tablet Commonly known as: TYLENOL Take 2 tablets (650 mg total) by mouth every 6 (six) hours as needed for mild pain (pain score 1-3 or temp > 100.5).   carbidopa-levodopa 25-100 MG tablet Commonly known as: SINEMET IR Take 1 tablet by mouth 3 (three) times daily. Take 1 tablet in the morning, 1 tablet in the evening, and 2 tablets  at bedtime.   Centrum Silver Chew Chew by mouth.   cyanocobalamin 100 MCG tablet Take 100 mcg by mouth daily.   docusate sodium 100 MG capsule Commonly known as: COLACE Take 100 mg by mouth every other day as needed for mild constipation.   enoxaparin 30 MG/0.3ML injection Commonly known as: LOVENOX Inject 0.3 mLs (30 mg total) into the skin daily.   levothyroxine 100 MCG tablet Commonly known as: SYNTHROID Take 100 mcg by mouth daily before breakfast.   primidone 50 MG tablet Commonly known as: MYSOLINE Take 50 mg by mouth at bedtime.   traMADol 50 MG tablet Commonly known as: ULTRAM Take 1 tablet (50 mg total) by mouth every 6 (six) hours as needed for moderate pain.            Discharge Care Instructions  (From admission, onward)         Start     Ordered   11/13/20 0000  Discharge wound care:       Comments: Keep wound clean and dry   11/13/20 1058          Follow-up Information    Anson Oregon, PA-C Follow up in 6 week(s).   Specialty: Physician Assistant Why: X-rays of the right hip. Contact information: [address] [place] [address] [phone]                Time coordinating discharge: 32 minutes  Signed:  Lurene Shadow  [facility] 11/13/2020, 10:59 AM   Pager on [email or web address]. If 7PM-7AM, please contact night-coverage at [email or web address]

## 2020-11-13 NOTE — Progress Notes (Signed)
Report called to [facility] , IV removed from RFA. EMS here to pick up patient via stretcher without incident

## 2020-11-13 NOTE — Care Management Important Message (Signed)
Important Message  Patient Details  Name: [name] [name] MRN: [id number] Date of Birth: 04/26/1918   Medicare Important Message Given:  Yes     Olegario Messier A Gilman Olazabal 11/13/2020, 11:06 AM

## 2020-12-23 ENCOUNTER — Ambulatory Visit: Payer: Medicare Other | Admitting: Podiatry

## 2021-04-07 ENCOUNTER — Encounter: Payer: Medicare Other | Attending: Internal Medicine | Admitting: Internal Medicine

## 2021-04-07 ENCOUNTER — Other Ambulatory Visit: Payer: Self-pay

## 2021-04-07 DIAGNOSIS — J449 Chronic obstructive pulmonary disease, unspecified: Secondary | ICD-10-CM | POA: Insufficient documentation

## 2021-04-07 DIAGNOSIS — F0280 Dementia in other diseases classified elsewhere without behavioral disturbance: Secondary | ICD-10-CM | POA: Diagnosis not present

## 2021-04-07 DIAGNOSIS — Z9981 Dependence on supplemental oxygen: Secondary | ICD-10-CM | POA: Diagnosis not present

## 2021-04-07 DIAGNOSIS — L89623 Pressure ulcer of left heel, stage 3: Secondary | ICD-10-CM | POA: Insufficient documentation

## 2021-04-07 DIAGNOSIS — L89613 Pressure ulcer of right heel, stage 3: Secondary | ICD-10-CM | POA: Insufficient documentation

## 2021-04-07 DIAGNOSIS — G309 Alzheimer's disease, unspecified: Secondary | ICD-10-CM | POA: Insufficient documentation

## 2021-04-07 NOTE — Progress Notes (Signed)
EVYN, KOOYMAN ([id number]) Visit Report for 04/07/2021 Allergy List Details Patient Name: [name] [name], [name] [name] Date of Service: 04/07/2021 10:15 AM Medical Record Number: [id number] Patient Account Number: [id number] Date of Birth/Sex: Sep 16, 1918 (87 y.o. F) Treating RN: Hansel Feinstein Primary Care Nate Common: Einar Crow Other Clinician: Lolita Cram Referring Imagine Nest: Leron Croak Treating Yaxiel Minnie/Extender: Maxwell Caul Weeks in Treatment: 0 Allergies Active Allergies No Known Allergies Allergy Notes Electronic Signature(s) Signed: 04/07/2021 2:06:04 PM By: Hansel Feinstein Entered By: Hansel Feinstein on 04/07/2021 10:55:01 [name] [name] ([id number]) -------------------------------------------------------------------------------- Arrival Information Details Patient Name: [name] [name] Date of Service: 04/07/2021 10:15 AM Medical Record Number: [id number] Patient Account Number: [id number] Date of Birth/Sex: [birth year] (87 y.o. F) Treating RN: Hansel Feinstein Primary Care Pasco Marchitto: Einar Crow Other Clinician: Lolita Cram Referring Chrisean Kloth: Leron Croak Treating Atina Feeley/Extender: Altamese [place] in Treatment: 0 Visit Information Patient Arrived: Wheel Chair Arrival Time: 10:20 Accompanied By: POA/niece Transfer Assistance: Manual Patient Identification Verified: Yes Secondary Verification Process Completed: Yes Patient Has Alerts: Yes Patient Alerts: NOT diabetic 2 person pivot Electronic Signature(s) Signed: 04/07/2021 2:06:04 PM By: Hansel Feinstein Entered By: Hansel Feinstein on 04/07/2021 10:32:34 [name] [name] ([id number]) -------------------------------------------------------------------------------- Clinic Level of Care Assessment Details Patient Name: [name] [name] Date of Service: 04/07/2021 10:15 AM Medical Record Number: [id number] Patient Account Number: [id number] Date of Birth/Sex: 03/16/1918 (87 y.o. F) Treating RN: Huel Coventry Primary Care  Draeden Kellman: Einar Crow Other Clinician: Lolita Cram Referring Mikenzi Raysor: Leron Croak Treating Loyalty Arentz/Extender: Altamese [place] in Treatment: 0 Clinic Level of Care Assessment Items TOOL 1 Quantity Score []  - Use when EandM and Procedure is performed on INITIAL visit 0 ASSESSMENTS - Nursing Assessment / Reassessment X - General Physical Exam (combine w/ comprehensive assessment (listed just below) when performed on new 1 20 pt. evals) X- 1 25 Comprehensive Assessment (HX, ROS, Risk Assessments, Wounds Hx, etc.) ASSESSMENTS - Wound and Skin Assessment / Reassessment []  - Dermatologic / Skin Assessment (not related to wound area) 0 ASSESSMENTS - Ostomy and/or Continence Assessment and Care []  - Incontinence Assessment and Management 0 []  - 0 Ostomy Care Assessment and Management (repouching, etc.) PROCESS - Coordination of Care X - Simple Patient / Family Education for ongoing care 1 15 []  - 0 Complex (extensive) Patient / Family Education for ongoing care X- 1 10 Staff obtains Consents, Records, Test Results / Process Orders []  - 0 Staff telephones HHA, Nursing Homes / Clarify orders / etc []  - 0 Routine Transfer to another Facility (non-emergent condition) []  - 0 Routine Hospital Admission (non-emergent condition) X- 1 15 New Admissions / / Ordering NPWT, Apligraf, etc. []  - 0 Emergency Hospital Admission (emergent condition) PROCESS - Special Needs []  - Pediatric / Minor Patient Management 0 []  - 0 Isolation Patient Management []  - 0 Hearing / Language / Visual special needs []  - 0 Assessment of Community assistance (transportation, D/C planning, etc.) []  - 0 Additional assistance / Altered mentation []  - 0 Support Surface(s) Assessment (bed, cushion, seat, etc.) INTERVENTIONS - Miscellaneous []  - External ear exam 0 []  - 0 Patient Transfer (multiple staff / / Similar devices) []  - 0 Simple Staple / Suture  removal (25 or less) []  - 0 Complex Staple / Suture removal (26 or more) []  - 0 Hypo/Hyperglycemic Management (do not check if billed separately) X- 1 15 Ankle / Brachial Index (ABI) - do not check if billed separately Has the patient been seen at the hospital within the last three years: Yes Total Score: 100  Level Of Care: New/Established - Level 3 [name] [name], [name] [name] ([id number]) Electronic Signature(s) Signed: 04/07/2021 4:08:37 PM By: Elliot Gurney, BSN, RN, CWS, Kim RN, BSN Entered By: Elliot Gurney, BSN, RN, CWS, Kim on 04/07/2021 11:21:35 [name] [name] ([id number]) -------------------------------------------------------------------------------- Encounter Discharge Information Details Patient Name: [name] [name] Date of Service: 04/07/2021 10:15 AM Medical Record Number: [id number] Patient Account Number: [id number] Date of Birth/Sex: 1918/08/03 (87 y.o. F) Treating RN: Rogers Blocker Primary Care Yaakov Saindon: Einar Crow Other Clinician: Lolita Cram Referring Dealva Lafoy: Leron Croak Treating Sharri Loya/Extender: Altamese [place] in Treatment: 0 Encounter Discharge Information Items Post Procedure Vitals Discharge Condition: Stable Temperature (F): 98.6 Ambulatory Status: Wheelchair Pulse (bpm): 55 Discharge Destination: Home Respiratory Rate (breaths/min): 16 Transportation: Private Auto Blood Pressure (mmHg): 110/68 Accompanied By: niece Schedule Follow-up Appointment: Yes Clinical Summary of Care: Electronic Signature(s) Signed: 04/07/2021 12:38:58 PM By: Phillis Haggis, Dondra Prader RN Entered By: Phillis Haggis, Dondra Prader on 04/07/2021 11:35:18 [name] [name] ([id number]) -------------------------------------------------------------------------------- Lower Extremity Assessment Details Patient Name: [name] [name] Date of Service: 04/07/2021 10:15 AM Medical Record Number: [id number] Patient Account Number: [id number] Date of Birth/Sex: 03-01-1918 (87 y.o. F) Treating RN:  Hansel Feinstein Primary Care Korayma Hagwood: Einar Crow Other Clinician: Lolita Cram Referring Elenor Wildes: Leron Croak Treating Prescott Truex/Extender: Altamese [place] in Treatment: 0 Edema Assessment Assessed: [Left: No] [Right: No] Edema: [Left: Yes] [Right: Yes] Calf Left: Right: Point of Measurement: 40 cm From Medial Instep 28 cm 27.4 cm Ankle Left: Right: Point of Measurement: 10 cm From Medial Instep 22 cm 21 cm Knee To Floor Left: Right: From Medial Instep 42 cm 42 cm Vascular Assessment Pulses: Dorsalis Pedis Palpable: [Left:Yes] [Right:Yes] Blood Pressure: Brachial: [Left:110] [Right:110] Ankle: [Left:Dorsalis Pedis: 150 1.36] [Right:Dorsalis Pedis: 160 1.45] Electronic Signature(s) Signed: 04/07/2021 2:06:04 PM By: Hansel Feinstein Entered By: Hansel Feinstein on 04/07/2021 10:54:44 [name] [name] ([id number]) -------------------------------------------------------------------------------- Multi Wound Chart Details Patient Name: [name] [name] Date of Service: 04/07/2021 10:15 AM Medical Record Number: [id number] Patient Account Number: [id number] Date of Birth/Sex: 17-Sep-1918 (87 y.o. F) Treating RN: Huel Coventry Primary Care Carline Dura: Einar Crow Other Clinician: Lolita Cram Referring Marlei Glomski: Leron Croak Treating Queenie Aufiero/Extender: Altamese [place] in Treatment: 0 Vital Signs Height(in): 64 Pulse(bpm): 55 Weight(lbs): 100 Blood Pressure(mmHg): 110/68 Body Mass Index(BMI): 17 Temperature(F): 98.6 Respiratory Rate(breaths/min): 16 Photos: [N/A:N/A] Wound Location: Left Calcaneus Right Calcaneus N/A Wounding Event: Gradually Appeared Gradually Appeared N/A Primary Etiology: Pressure Ulcer Pressure Ulcer N/A Date Acquired: 01/12/2021 01/12/2021 N/A Weeks of Treatment: 0 0 N/A Wound Status: Open Open N/A Measurements L x W x D (cm) 1.2x0.8x0.3 0.6x0.8x0.3 N/A Area (cm) : 0.754 0.377 N/A Volume (cm) : 0.226 0.113 N/A Classification:  Category/Stage III Category/Stage III N/A Exudate Amount: Medium Medium N/A Exudate Type: Serosanguineous Serosanguineous N/A Exudate Color: red, brown red, brown N/A Granulation Amount: Medium (34-66%) Medium (34-66%) N/A Granulation Quality: Red, Pink, Pale Red, Pink, Pale N/A Necrotic Amount: Medium (34-66%) Medium (34-66%) N/A Exposed Structures: Fat Layer (Subcutaneous Tissue): Fat Layer (Subcutaneous Tissue): N/A Yes Yes Fascia: No Fascia: No Tendon: No Tendon: No Muscle: No Muscle: No Joint: No Joint: No Bone: No Bone: No Epithelialization: None None N/A Debridement: N/A Debridement - Excisional N/A Pre-procedure Verification/Time N/A 11:16 N/A Out Taken: Tissue Debrided: N/A Subcutaneous, Slough N/A Level: N/A Skin/Subcutaneous Tissue N/A Debridement Area (sq cm): N/A 0.48 N/A Instrument: N/A Curette N/A Bleeding: N/A Moderate N/A Hemostasis Achieved: N/A Pressure N/A Debridement Treatment N/A Procedure was tolerated well N/A Response: Post Debridement N/A 0.6x0.8x0.4 N/A Measurements L x W x D (cm) Post Debridement Volume:  N/A 0.151 N/A (cm) Post Debridement Stage: N/A Category/Stage III N/A Procedures Performed: N/A Debridement N/A [name] [name], [name] [name] ([id number]) Treatment Notes Wound #1 (Calcaneus) Wound Laterality: Left Cleanser Wound Cleanser Discharge Instruction: Wash your hands with soap and water. Remove old dressing, discard into plastic bag and place into trash. Cleanse the wound with Wound Cleanser prior to applying a clean dressing using gauze sponges, not tissues or cotton balls. Do not scrub or use excessive force. Pat dry using gauze sponges, not tissue or cotton balls. Peri-Wound Care Topical Primary Dressing Prisma 4.34 (in) Discharge Instruction: Moisten w/normal saline or sterile water; Cover wound as directed. Do not remove from wound bed. Secondary Dressing Mepilex Border Flex, 4x4 (in/in) Discharge Instruction: Apply to wound as  directed. Do not cut. Secured With Compression Wrap Compression Stockings Add-Ons Wound #2 (Calcaneus) Wound Laterality: Right Cleanser Wound Cleanser Discharge Instruction: Wash your hands with soap and water. Remove old dressing, discard into plastic bag and place into trash. Cleanse the wound with Wound Cleanser prior to applying a clean dressing using gauze sponges, not tissues or cotton balls. Do not scrub or use excessive force. Pat dry using gauze sponges, not tissue or cotton balls. Peri-Wound Care Topical Primary Dressing Prisma 4.34 (in) Discharge Instruction: Moisten w/normal saline or sterile water; Cover wound as directed. Do not remove from wound bed. Secondary Dressing Mepilex Border Flex, 4x4 (in/in) Discharge Instruction: Apply to wound as directed. Do not cut. Secured With Compression Wrap Compression Stockings Facilities manager) Signed: 04/07/2021 5:04:28 PM By: Baltazar Najjar MD Entered By: Baltazar Najjar on 04/07/2021 12:27:01 [name] [name] ([id number]) -------------------------------------------------------------------------------- Multi-Disciplinary Care Plan Details Patient Name: [name] [name] Date of Service: 04/07/2021 10:15 AM Medical Record Number: [id number] Patient Account Number: [id number] Date of Birth/Sex: 04-08-1918 (87 y.o. F) Treating RN: Huel Coventry Primary Care Shavaughn Seidl: Einar Crow Other Clinician: Lolita Cram Referring Ival Pacer: Leron Croak Treating Tadarius Maland/Extender: Altamese [place] in Treatment: 0 Active Inactive Necrotic Tissue Nursing Diagnoses: Impaired tissue integrity related to necrotic/devitalized tissue Goals: Necrotic/devitalized tissue will be minimized in the wound bed Date Initiated: 04/07/2021 Target Resolution Date: 04/15/2021 Goal Status: Active Interventions: Assess patient pain level pre-, during and post procedure and prior to discharge Treatment Activities: Apply topical  anesthetic as ordered : 04/07/2021 Notes: Orientation to the Wound Care Program Nursing Diagnoses: Knowledge deficit related to the wound healing center program Goals: Patient/caregiver will verbalize understanding of the Wound Healing Center Program Date Initiated: 04/07/2021 Target Resolution Date: 04/15/2021 Goal Status: Active Interventions: Provide education on orientation to the wound center Notes: Pressure Nursing Diagnoses: Knowledge deficit related to management of pressures ulcers Goals: Patient will remain free from development of additional pressure ulcers Date Initiated: 04/07/2021 Target Resolution Date: 04/07/2021 Goal Status: Active Patient will remain free of pressure ulcers Date Initiated: 04/07/2021 Target Resolution Date: 04/07/2021 Goal Status: Active Interventions: Provide education on pressure ulcers Notes: Wound/Skin Impairment Nursing Diagnoses: Impaired tissue integrity [name] [name], [name] [name] ([id number]) Goals: Ulcer/skin breakdown will have a volume reduction of 30% by week 4 Date Initiated: 04/07/2021 Target Resolution Date: 05/07/2021 Goal Status: Active Interventions: Assess ulceration(s) every visit Notes: Electronic Signature(s) Signed: 04/07/2021 4:08:37 PM By: Elliot Gurney, BSN, RN, CWS, Kim RN, BSN Entered By: Elliot Gurney, BSN, RN, CWS, Kim on 04/07/2021 11:15:29 [name] [name] ([id number]) -------------------------------------------------------------------------------- Pain Assessment Details Patient Name: [name] [name] Date of Service: 04/07/2021 10:15 AM Medical Record Number: [id number] Patient Account Number: [id number] Date of Birth/Sex: [birth year] (87 y.o. F) Treating RN: Hansel Feinstein Primary Care Tennis Mckinnon: Einar Crow Other Clinician: Lolita Cram  Referring Amiyah Shryock: Leron CroakPOGGI, JOHN Treating Jennalee Greaves/Extender: Altamese CarolinaOBSON, MICHAEL G Weeks in Treatment: 0 Active Problems Location of Pain Severity and Description of Pain Patient Has Paino No Site  Locations Rate the pain. Current Pain Level: 0 Pain Management and Medication Current Pain Management: Notes not at this time Electronic Signature(s) Signed: 04/07/2021 2:06:04 PM By: Hansel FeinsteinBishop, Joy Entered By: Hansel FeinsteinBishop, Joy on 04/07/2021 10:32:48 [name] RowerWALKER, [name] [name] ([id number]) -------------------------------------------------------------------------------- Patient/Caregiver Education Details Patient Name: [name] RowerWALKER, [name] [name] Date of Service: 04/07/2021 10:15 AM Medical Record Number: [id number] Patient Account Number: [id number] Date of Birth/Gender: 01/08/1918 (87 y.o. F) Treating RN: Huel CoventryWoody, Kim Primary Care Physician: Einar CrowAnderson, Marshall Other Clinician: Lolita CramBurnette, Kyara Referring Physician: Leron CroakPOGGI, JOHN Treating Physician/Extender: Altamese CarolinaOBSON, MICHAEL G Weeks in Treatment: 0 Education Assessment Education Provided To: Patient Education Topics Provided Pressure: Handouts: Preventing Pressure Ulcers Methods: Demonstration, Explain/Verbal Responses: State content correctly Welcome To The [facility]: Handouts: Welcome To The [facility] Methods: Demonstration, Explain/Verbal Responses: State content correctly Wound/Skin Impairment: Handouts: Caring for Your Ulcer Methods: Demonstration, Explain/Verbal Responses: State content correctly Electronic Signature(s) Signed: 04/07/2021 4:08:37 PM By: Elliot GurneyWoody, BSN, RN, CWS, Kim RN, BSN Entered By: Elliot GurneyWoody, BSN, RN, CWS, Kim on 04/07/2021 11:22:08 [name] RowerWALKER, [name] [name] ([id number]) -------------------------------------------------------------------------------- Wound Assessment Details Patient Name: [name] RowerWALKER, [name] [name] Date of Service: 04/07/2021 10:15 AM Medical Record Number: [id number] Patient Account Number: [id number] Date of Birth/Sex: 12/07/1918 (87 y.o. F) Treating RN: Hansel FeinsteinBishop, Joy Primary Care Nikyah Lackman: Einar CrowAnderson, Marshall Other Clinician: Lolita CramBurnette, Kyara Referring Davarious Tumbleson: Leron CroakPOGGI, JOHN Treating Ayn Domangue/Extender: Altamese CarolinaOBSON, MICHAEL G Weeks in  Treatment: 0 Wound Status Wound Number: 1 Primary Etiology: Pressure Ulcer Wound Location: Left Calcaneus Wound Status: Open Wounding Event: Gradually Appeared Date Acquired: 01/12/2021 Weeks Of Treatment: 0 Clustered Wound: No Photos Wound Measurements Length: (cm) 1.2 Width: (cm) 0.8 Depth: (cm) 0.3 Area: (cm) 0.754 Volume: (cm) 0.226 % Reduction in Area: % Reduction in Volume: Epithelialization: None Tunneling: No Undermining: No Wound Description Classification: Category/Stage III Exudate Amount: Medium Exudate Type: Serosanguineous Exudate Color: red, brown Foul Odor After Cleansing: No Slough/Fibrino Yes Wound Bed Granulation Amount: Medium (34-66%) Exposed Structure Granulation Quality: Red, Pink, Pale Fascia Exposed: No Necrotic Amount: Medium (34-66%) Fat Layer (Subcutaneous Tissue) Exposed: Yes Necrotic Quality: Adherent Slough Tendon Exposed: No Muscle Exposed: No Joint Exposed: No Bone Exposed: No Treatment Notes Wound #1 (Calcaneus) Wound Laterality: Left Cleanser Wound Cleanser Discharge Instruction: Wash your hands with soap and water. Remove old dressing, discard into plastic bag and place into trash. Cleanse the wound with Wound Cleanser prior to applying a clean dressing using gauze sponges, not tissues or cotton balls. Do not scrub or use excessive force. Pat dry using gauze sponges, not tissue or cotton balls. [name] RowerWALKER, [name] [name] ([id number]) Peri-Wound Care Topical Primary Dressing Prisma 4.34 (in) Discharge Instruction: Moisten w/normal saline or sterile water; Cover wound as directed. Do not remove from wound bed. Secondary Dressing Mepilex Border Flex, 4x4 (in/in) Discharge Instruction: Apply to wound as directed. Do not cut. Secured With Compression Wrap Compression Stockings Add-Ons Electronic Signature(s) Signed: 04/07/2021 2:06:04 PM By: Hansel FeinsteinBishop, Joy Entered By: Hansel FeinsteinBishop, Joy on 04/07/2021 10:41:41 [name] RowerWALKER, [name] [name]  ([id number]) -------------------------------------------------------------------------------- Wound Assessment Details Patient Name: [name] RowerWALKER, [name] [name] Date of Service: 04/07/2021 10:15 AM Medical Record Number: [id number] Patient Account Number: [id number] Date of Birth/Sex: 06/12/1918 (87 y.o. F) Treating RN: Hansel FeinsteinBishop, Joy Primary Care Graclyn Lawther: Einar CrowAnderson, Marshall Other Clinician: Lolita CramBurnette, Kyara Referring Alishea Beaudin: Leron CroakPOGGI, JOHN Treating Estiben Mizuno/Extender: Altamese CarolinaOBSON, MICHAEL G Weeks in Treatment: 0 Wound Status Wound Number: 2 Primary Etiology: Pressure Ulcer Wound Location: Right Calcaneus Wound Status: Open Wounding  Event: Gradually Appeared Date Acquired: 01/12/2021 Weeks Of Treatment: 0 Clustered Wound: No Photos Wound Measurements Length: (cm) 0.6 Width: (cm) 0.8 Depth: (cm) 0.3 Area: (cm) 0.377 Volume: (cm) 0.113 % Reduction in Area: % Reduction in Volume: Epithelialization: None Tunneling: No Undermining: No Wound Description Classification: Category/Stage III Exudate Amount: Medium Exudate Type: Serosanguineous Exudate Color: red, brown Foul Odor After Cleansing: No Slough/Fibrino Yes Wound Bed Granulation Amount: Medium (34-66%) Exposed Structure Granulation Quality: Red, Pink, Pale Fascia Exposed: No Necrotic Amount: Medium (34-66%) Fat Layer (Subcutaneous Tissue) Exposed: Yes Necrotic Quality: Adherent Slough Tendon Exposed: No Muscle Exposed: No Joint Exposed: No Bone Exposed: No Treatment Notes Wound #2 (Calcaneus) Wound Laterality: Right Cleanser Wound Cleanser Discharge Instruction: Wash your hands with soap and water. Remove old dressing, discard into plastic bag and place into trash. Cleanse the wound with Wound Cleanser prior to applying a clean dressing using gauze sponges, not tissues or cotton balls. Do not scrub or use excessive force. Pat dry using gauze sponges, not tissue or cotton balls. [name] [name], [name] [name] ([id number]) Peri-Wound  Care Topical Primary Dressing Prisma 4.34 (in) Discharge Instruction: Moisten w/normal saline or sterile water; Cover wound as directed. Do not remove from wound bed. Secondary Dressing Mepilex Border Flex, 4x4 (in/in) Discharge Instruction: Apply to wound as directed. Do not cut. Secured With Compression Wrap Compression Stockings Add-Ons Electronic Signature(s) Signed: 04/07/2021 2:06:04 PM By: Hansel Feinstein Entered By: Hansel Feinstein on 04/07/2021 10:45:01 [name] [name] ([id number]) -------------------------------------------------------------------------------- Vitals Details Patient Name: [name] [name] Date of Service: 04/07/2021 10:15 AM Medical Record Number: [id number] Patient Account Number: [id number] Date of Birth/Sex: 05/03/1918 (87 y.o. F) Treating RN: Hansel Feinstein Primary Care Viviene Thurston: Einar Crow Other Clinician: Lolita Cram Referring Lachanda Buczek: Leron Croak Treating Yomayra Tate/Extender: Altamese [place] in Treatment: 0 Vital Signs Time Taken: 10:34 Temperature (F): 98.6 Height (in): 64 Pulse (bpm): 55 Source: Stated Respiratory Rate (breaths/min): 16 Weight (lbs): 100 Blood Pressure (mmHg): 110/68 Source: Stated Reference Range: 80 - 120 mg / dl Body Mass Index (BMI): 17.2 Electronic Signature(s) Signed: 04/07/2021 2:06:04 PM By: Hansel Feinstein Entered ByHansel Feinstein on 04/07/2021 10:33:34

## 2021-04-07 NOTE — Progress Notes (Signed)
[name] [name], [name] [name] ([id number]) Visit Report for 04/07/2021 Abuse/Suicide Risk Screen Details Patient Name: [name] [name], [name] [name] Date of Service: 04/07/2021 10:15 AM Medical Record Number: [id number] Patient Account Number: [id number] Date of Birth/Sex: 03/28/1918 (87 y.o. F) Treating RN: Hansel Feinstein Primary Care Shabreka Coulon: Einar Crow Other Clinician: Lolita Cram Referring Brieana Shimmin: Leron Croak Treating Jacob Chamblee/Extender: [employer] [place] in Treatment: 0 Abuse/Suicide Risk Screen Items Answer ABUSE RISK SCREEN: Has anyone close to you tried to hurt or harm you recentlyo No Do you feel uncomfortable with anyone in your familyo No Has anyone forced you do things that you didnot want to doo No Electronic Signature(s) Signed: 04/07/2021 2:06:04 PM By: Hansel Feinstein Entered By: Hansel Feinstein on 04/07/2021 10:59:23 [name] [name] ([id number]) -------------------------------------------------------------------------------- Activities of Daily Living Details Patient Name: [name] [name] Date of Service: 04/07/2021 10:15 AM Medical Record Number: [id number] Patient Account Number: [id number] Date of Birth/Sex: January 19, 1918 (87 y.o. F) Treating RN: Hansel Feinstein Primary Care Charlen Bakula: Einar Crow Other Clinician: Lolita Cram Referring Siniyah Evangelist: Leron Croak Treating Om Lizotte/Extender: [facility] [place] in Treatment: 0 Activities of Daily Living Items Answer Activities of Daily Living (Please select one for each item) Drive Automobile Not Able Take Medications Not Able Use Telephone Need Assistance Care for Appearance Need Assistance Use Toilet Not Able Bath / Shower Not Able Dress Self Not Able Feed Self Need Assistance Walk Not Able Get In / Out Bed Not Able Housework Not Able Prepare Meals Not Able Handle Money Not Able Shop for Self Not Able Electronic Signature(s) Signed: 04/07/2021 2:06:04 PM By: Hansel Feinstein Entered By: Hansel Feinstein on 04/07/2021  11:00:01 [name] [name] ([id number]) -------------------------------------------------------------------------------- Education Screening Details Patient Name: [name] [name] Date of Service: 04/07/2021 10:15 AM Medical Record Number: [id number] Patient Account Number: [id number] Date of Birth/Sex: 1918/11/14 (87 y.o. F) Treating RN: Hansel Feinstein Primary Care Jaz Laningham: Einar Crow Other Clinician: Lolita Cram Referring Zyla Dascenzo: Leron Croak Treating Joneric Streight/Extender: [employer] [place] in Treatment: 0 Primary Learner Assessed: Patient Learning Preferences/Education Level/Primary Language Learning Preference: Explanation Highest Education Level: College or Above Preferred Language: English Cognitive Barrier Language Barrier: No Translator Needed: No Memory Deficit: No Emotional Barrier: No Cultural/Religious Beliefs Affecting Medical Care: No Physical Barrier Impaired Vision: Yes Glasses Impaired Hearing: No Decreased Hand dexterity: No Knowledge/Comprehension Knowledge Level: Medium Comprehension Level: High Ability to understand written instructions: High Ability to understand verbal instructions: High Motivation Anxiety Level: Calm Cooperation: Cooperative Education Importance: Acknowledges Need Interest in Health Problems: Asks Questions Perception: Confused Willingness to Engage in Self-Management Medium Activities: Readiness to Engage in Self-Management Medium Activities: Electronic Signature(s) Signed: 04/07/2021 2:06:04 PM By: Hansel Feinstein Entered By: Hansel Feinstein on 04/07/2021 11:00:42 [name] [name] ([id number]) -------------------------------------------------------------------------------- Fall Risk Assessment Details Patient Name: [name] [name] Date of Service: 04/07/2021 10:15 AM Medical Record Number: [id number] Patient Account Number: [id number] Date of Birth/Sex: 06/21/1918 (87 y.o. F) Treating RN: Hansel Feinstein Primary Care Tristy Udovich:  Einar Crow Other Clinician: Lolita Cram Referring Kynadi Dragos: Leron Croak Treating Riham Polyakov/Extender: [employer] [place] in Treatment: 0 Fall Risk Assessment Items Have you had 2 or more falls in the last 12 monthso 0 Yes Have you had any fall that resulted in injury in the last 12 monthso 0 Yes FALLS RISK SCREEN History of falling - immediate or within 3 months 25 Yes Secondary diagnosis (Do you have 2 or more medical diagnoseso) 15 Yes Ambulatory aid None/bed rest/wheelchair/nurse 0 No Crutches/cane/Roza 0 No Furniture 0 No Intravenous therapy Access/Saline/Heparin Lock 0 No Gait/Transferring Normal/ bed rest/ wheelchair 0 No Weak (short  steps with or without shuffle, stooped but able to lift head while walking, may 0 No seek support from furniture) Impaired (short steps with shuffle, may have difficulty arising from chair, head down, impaired 0 No balance) Mental Status Oriented to own ability 0 No Electronic Signature(s) Signed: 04/07/2021 2:06:04 PM By: Hansel Feinstein Entered By: Hansel Feinstein on 04/07/2021 11:01:08 [name] [name] ([id number]) -------------------------------------------------------------------------------- Foot Assessment Details Patient Name: [name] [name] Date of Service: 04/07/2021 10:15 AM Medical Record Number: [id number] Patient Account Number: [id number] Date of Birth/Sex: 18-May-1918 (87 y.o. F) Treating RN: Hansel Feinstein Primary Care Kaelum Kissick: Einar Crow Other Clinician: Lolita Cram Referring Maeby Vankleeck: Leron Croak Treating Brityn Mastrogiovanni/Extender: [employer] [place] in Treatment: 0 Foot Assessment Items [x]  Unable to perform due to altered mental status Site Locations + = Sensation present, - = Sensation absent, C = Callus, U = Ulcer R = Redness, W = Warmth, M = Maceration, PU = Pre-ulcerative lesion F = Fissure, S = Swelling, D = Dryness Assessment Right: Left: Other Deformity: No No Prior Foot Ulcer: No No Prior  Amputation: No No Charcot Joint: No No Ambulatory Status: Non-ambulatory Assistance Device: Wheelchair Gait: ) Signed: 04/07/2021 2:06:04 PM By: 04/09/2021 Entered By: Hansel Feinstein on 04/07/2021 11:03:21 04/09/2021 ([name] [name]) -------------------------------------------------------------------------------- Nutrition Risk Screening Details Patient Name: [id number] Date of Service: 04/07/2021 10:15 AM Medical Record Number: 04/09/2021 Patient Account Number: [id number] Date of Birth/Sex: [birth year] (87 y.o. F) Treating RN: 07/16/1918 Primary Care Huy Majid: Hansel Feinstein Other Clinician: Einar Crow Referring Anberlyn Feimster: Lolita Cram Treating Mikel [place]/Extender: Leron Croak in Treatment: 0 Height (in): 64 Weight (lbs): 100 Body Mass Index (BMI): 17.2 Nutrition Risk Screening Items Score Screening NUTRITION RISK SCREEN: I have an illness or condition that made me change the kind and/or amount of food I eat 0 No I eat fewer than two meals per day 0 No I eat few fruits and vegetables, or milk products 0 No I have three or more drinks of beer, liquor or wine almost every day 0 No I have tooth or mouth problems that make it hard for me to eat 0 No I don't always have enough money to buy the food I need 0 No I eat alone most of the time 0 No I take three or more different prescribed or over-the-counter drugs a day 1 Yes Without wanting to, I have lost or gained 10 pounds in the last six months 2 Yes I am not always physically able to shop, cook and/or feed myself 2 Yes Nutrition Protocols Good Risk Protocol Moderate Risk Protocol 0 Provide education on nutrition High Risk Proctocol Risk Level: Moderate Risk Score: 5 Electronic Signature(s) Signed: 04/07/2021 2:06:04 PM By: 04/09/2021 Entered By: Hansel Feinstein on 04/07/2021 11:01:33

## 2021-04-07 NOTE — Progress Notes (Signed)
JERSEE, WINIARSKI ([id number]) Visit Report for 04/07/2021 Chief Complaint Document Details Patient Name: [name] [name], [name] [name] Date of Service: 04/07/2021 10:15 AM Medical Record Number: [id number] Patient Account Number: [id number] Date of Birth/Sex: Mar 23, 1918 (87 y.o. F) Treating RN: Huel Coventry Primary Care Provider: Einar Crow Other Clinician: Lolita Cram Referring Provider: Leron Croak Treating Provider/Extender: Altamese [place] in Treatment: 0 Information Obtained from: Patient Chief Complaint 04/07/2021; patient is here for wounds on her bilateral heels towards the tips of both Electronic Signature(s) Signed: 04/07/2021 5:04:28 PM By: Baltazar Najjar MD Entered By: Baltazar Najjar on 04/07/2021 12:35:00 [name] [name] ([id number]) -------------------------------------------------------------------------------- Debridement Details Patient Name: [name] [name] Date of Service: 04/07/2021 10:15 AM Medical Record Number: [id number] Patient Account Number: [id number] Date of Birth/Sex: 31-May-1918 (87 y.o. F) Treating RN: Huel Coventry Primary Care Provider: Einar Crow Other Clinician: Lolita Cram Referring Provider: Leron Croak Treating Provider/Extender: Altamese [place] in Treatment: 0 Debridement Performed for Wound #2 Right Calcaneus Assessment: Performed By: Physician Maxwell Caul, MD Debridement Type: Debridement Level of Consciousness (Pre- Awake and Alert procedure): Pre-procedure Verification/Time Out Yes - 11:16 Taken: Total Area Debrided (L x W): 0.6 (cm) x 0.8 (cm) = 0.48 (cm) Tissue and other material Viable, Non-Viable, Slough, Subcutaneous, Skin: Dermis , Skin: Epidermis, Slough debrided: Level: Skin/Subcutaneous Tissue Debridement Description: Excisional Instrument: Curette Bleeding: Moderate Hemostasis Achieved: Pressure Response to Treatment: Procedure was tolerated well Level of Consciousness (Post- Awake and  Alert procedure): Post Debridement Measurements of Total Wound Length: (cm) 0.6 Stage: Category/Stage III Width: (cm) 0.8 Depth: (cm) 0.4 Volume: (cm) 0.151 Character of Wound/Ulcer Post Debridement: Stable Post Procedure Diagnosis Same as Pre-procedure Electronic Signature(s) Signed: 04/07/2021 4:08:37 PM By: Elliot Gurney, BSN, RN, CWS, Kim RN, BSN Signed: 04/07/2021 5:04:28 PM By: Baltazar Najjar MD Entered By: Baltazar Najjar on 04/07/2021 12:27:44 [name] [name] ([id number]) -------------------------------------------------------------------------------- HPI Details Patient Name: [name] [name] Date of Service: 04/07/2021 10:15 AM Medical Record Number: [id number] Patient Account Number: [id number] Date of Birth/Sex: Mar 26, 1918 (87 y.o. F) Treating RN: Huel Coventry Primary Care Provider: Einar Crow Other Clinician: Lolita Cram Referring Provider: Leron Croak Treating Provider/Extender: Altamese [place] in Treatment: 0 History of Present Illness HPI Description: ADMISSION 04/07/2021 This is a 87 year old woman who is currently a resident at [facility] skilled facility. She is here with her niece who is her primary family member. Prior to December she also was her caregiver. Apparently she was hospitalized from 11/06/2020 through 11/13/2020 with a right femoral neck fracture requiring a hip replacement. She was sent to [facility] for rehabilitation. She developed bilateral heel pressure ulcers over the tip of her heels. She is nonambulatory. Her niece is uncertain about the degree of pressure relief. However they have been using Santyl as the primary dressing for about 2 months. Past medical history includes COPD now O2 dependent, dementia, hypothyroidism, Alzheimer's disease, history of protein calorie malnutrition although her albumin while in the hospital was 3.4, she has essential tremor although she is on Sinemet and has been for many  years, hypothyroidism. She is nonambulatory. ABIs in our clinic were 1.45 on the right and 1.36 on the left i.e. Electronic Signature(s) Signed: 04/07/2021 5:04:28 PM By: Baltazar Najjar MD Entered By: Baltazar Najjar on 04/07/2021 12:31:03 [name] [name] ([id number]) -------------------------------------------------------------------------------- Physical Exam Details Patient Name: [name] [name] Date of Service: 04/07/2021 10:15 AM Medical Record Number: [id number] Patient Account Number: [id number] Date of Birth/Sex: 10/27/1918 (87 y.o. F) Treating RN: Huel Coventry Primary Care Provider: Einar Crow Other Clinician: Lolita Cram Referring Provider:  POGGI, JOHN Treating Provider/Extender: Maxwell CaulOBSON, Shahira Fiske G Weeks in Treatment: 0 Constitutional Sitting or standing Blood Pressure is within target range for patient.. Pulse regular and within target range for patient.Marland Kitchen. Respirations regular, non- labored and within target range.. Temperature is normal and within the target range for the patient.Marland Kitchen. appears in no distress. Ears, Nose, Mouth, and Throat Patient is hard of hearing.. Notes Wound exam; The patient has a more superficial wound on the left clean granulated surface. No debridement is necessary on the right some surface slough and adherent debris I removed this with a #3 curette hemostasis with direct pressure. No evidence of infection in either area. Electronic Signature(s) Signed: 04/07/2021 5:04:28 PM By: Baltazar Najjarobson, Tyresha Fede MD Entered By: Baltazar Najjarobson, Waniya Hoglund on 04/07/2021 12:32:23 [name] RowerWALKER, Tomika ([id number]) -------------------------------------------------------------------------------- Physician Orders Details Patient Name: [name] RowerWALKER, [name] Date of Service: 04/07/2021 10:15 AM Medical Record Number: [id number] Patient Account Number: [id number] Date of Birth/Sex: 04/26/1918 (87 y.o. F) Treating RN: Huel CoventryWoody, Kim Primary Care Provider: Einar CrowAnderson, Marshall Other Clinician: Lolita CramBurnette,  Kyara Referring Provider: Leron CroakPOGGI, JOHN Treating Provider/Extender: Altamese CarolinaOBSON, Harman Ferrin G Weeks in Treatment: 0 Verbal / Phone Orders: No Diagnosis Coding Follow-up Appointments o Return Appointment in 2 weeks. Bathing/ Shower/ Hygiene o May shower; gently cleanse wound with antibacterial soap, rinse and pat dry prior to dressing wounds Off-Loading o Turn and reposition every 2 hours - Keep booties on feet while in the bed, float heels. Float heels when sitting in wheelchair. Wound Treatment Wound #1 - Calcaneus Wound Laterality: Left Cleanser: Wound Cleanser 3 x Per Week/30 Days Discharge Instructions: Wash your hands with soap and water. Remove old dressing, discard into plastic bag and place into trash. Cleanse the wound with Wound Cleanser prior to applying a clean dressing using gauze sponges, not tissues or cotton balls. Do not scrub or use excessive force. Pat dry using gauze sponges, not tissue or cotton balls. Primary Dressing: Prisma 4.34 (in) 3 x Per Week/30 Days Discharge Instructions: Moisten w/normal saline or sterile water; Cover wound as directed. Do not remove from wound bed. Secondary Dressing: Mepilex Border Flex, 4x4 (in/in) 3 x Per Week/30 Days Discharge Instructions: Apply to wound as directed. Do not cut. Electronic Signature(s) Signed: 04/07/2021 4:08:37 PM By: Elliot GurneyWoody, BSN, RN, CWS, Kim RN, BSN Signed: 04/07/2021 5:04:28 PM By: Baltazar Najjarobson, Felise [place] MD Entered By: Elliot GurneyWoody, BSN, RN, CWS, Kim on 04/07/2021 11:20:47 [name] RowerWALKER, Ski ([id number]) -------------------------------------------------------------------------------- Problem List Details Patient Name: [name] RowerWALKER, Ariani Date of Service: 04/07/2021 10:15 AM Medical Record Number: [id number] Patient Account Number: [id number] Date of Birth/Sex: 03/08/1918 (87 y.o. F) Treating RN: Huel CoventryWoody, Kim Primary Care Provider: Einar CrowAnderson, Marshall Other Clinician: Lolita CramBurnette, Kyara Referring Provider: Leron CroakPOGGI, JOHN Treating  Provider/Extender: Altamese CarolinaOBSON, Yoshito Gaza G Weeks in Treatment: 0 Active Problems ICD-10 Encounter Code Description Active Date MDM Diagnosis L89.613 Pressure ulcer of right heel, stage 3 04/07/2021 No Yes L89.623 Pressure ulcer of left heel, stage 3 04/07/2021 No Yes Inactive Problems Resolved Problems Electronic Signature(s) Signed: 04/07/2021 5:04:28 PM By: Baltazar Najjarobson, Tyrika Newman MD Entered By: Baltazar Najjarobson, Bailie Christenbury on 04/07/2021 12:26:54 [name] RowerWALKER, Gennie ([id number]) -------------------------------------------------------------------------------- Progress Note Details Patient Name: [name] RowerWALKER, Leauna Date of Service: 04/07/2021 10:15 AM Medical Record Number: [id number] Patient Account Number: [id number] Date of Birth/Sex: 09/15/1918 (87 y.o. F) Treating RN: Huel CoventryWoody, Kim Primary Care Provider: Einar CrowAnderson, Marshall Other Clinician: Lolita CramBurnette, Kyara Referring Provider: Leron CroakPOGGI, JOHN Treating Provider/Extender: Altamese CarolinaOBSON, Deandre Brannan G Weeks in Treatment: 0 Subjective History of Present Illness (HPI) ADMISSION 04/07/2021 This is a 87 year old woman who is currently a resident at [facility]. She is here with  her niece who is her primary family member. Prior to December she also was her caregiver. Apparently she was hospitalized from 11/06/2020 through 11/13/2020 with a right femoral neck fracture requiring a hip replacement. She was sent to [facility] for rehabilitation. She developed bilateral heel pressure ulcers over the tip of her heels. She is nonambulatory. Her niece is uncertain about the degree of pressure relief. However they have been using Santyl as the primary dressing for about 2 months. Past medical history includes COPD now O2 dependent, dementia, hypothyroidism, Alzheimer's disease, history of protein calorie malnutrition although her albumin while in the hospital was 3.4, she has essential tremor although she is on Sinemet and has been for many years, hypothyroidism. She is  nonambulatory. ABIs in our clinic were 1.45 on the right and 1.36 on the left i.e. Patient History Information obtained from Patient. Allergies No Known Allergies Social History Never smoker, Marital Status - Widowed, Alcohol Use - Never, Drug Use - No History, Caffeine Use - Never. Medical History Eyes Denies history of Cataracts, Glaucoma, Optic Neuritis Ear/Nose/Mouth/Throat Denies history of Chronic sinus problems/congestion, Middle ear problems Hematologic/Lymphatic Denies history of Anemia, Hemophilia, Human Immunodeficiency Virus, Lymphedema, Sickle Cell Disease Respiratory Patient has history of Chronic Obstructive Pulmonary Disease (COPD) Denies history of Aspiration, Asthma, Pneumothorax, Sleep Apnea, Tuberculosis Cardiovascular Denies history of Angina, Arrhythmia, Congestive Heart Failure, Coronary Artery Disease, Deep Vein Thrombosis, Hypertension, Hypotension, Myocardial Infarction, Peripheral Arterial Disease, Peripheral Venous Disease, Phlebitis, Vasculitis Gastrointestinal Denies history of Cirrhosis , Colitis, Crohn s, Hepatitis A, Hepatitis B, Hepatitis C Endocrine Denies history of Type I Diabetes, Type II Diabetes Genitourinary Denies history of End Stage Renal Disease Immunological Denies history of Lupus Erythematosus, Raynaud s, Scleroderma Integumentary (Skin) Denies history of History of Burn, History of pressure wounds Musculoskeletal Denies history of Gout, Rheumatoid Arthritis, Osteoarthritis, Osteomyelitis Neurologic Patient has history of Dementia Denies history of Neuropathy, Quadriplegia, Paraplegia, Seizure Disorder Oncologic Denies history of Received Chemotherapy, Received Radiation Psychiatric Denies history of Anorexia/bulimia, Confinement Anxiety Review of Systems (ROS) Constitutional Symptoms (General Health) Denies complaints or symptoms of Fatigue, Fever, Chills, Marked Weight Change. Eyes Complains or has symptoms of Glasses /  Contacts. Denies complaints or symptoms of Dry Eyes, Vision Changes. BRITANNY, MARKSBERRY ([id number]) Ear/Nose/Mouth/Throat Denies complaints or symptoms of Difficult clearing ears, Sinusitis. Hematologic/Lymphatic Denies complaints or symptoms of Bleeding / Clotting Disorders, Human Immunodeficiency Virus. Respiratory Denies complaints or symptoms of Chronic or frequent coughs, Shortness of Breath. Cardiovascular Denies complaints or symptoms of Chest pain, LE edema. Gastrointestinal Denies complaints or symptoms of Frequent diarrhea, Nausea, Vomiting. Endocrine Denies complaints or symptoms of Hepatitis, Thyroid disease, Polydypsia (Excessive Thirst). Genitourinary Denies complaints or symptoms of Kidney failure/ Dialysis, Incontinence/dribbling. Immunological Denies complaints or symptoms of Hives, Itching. Integumentary (Skin) Complains or has symptoms of Wounds. Denies complaints or symptoms of Bleeding or bruising tendency, Breakdown, Swelling. Musculoskeletal Denies complaints or symptoms of Muscle Pain, Muscle Weakness. Neurologic Denies complaints or symptoms of Numbness/parasthesias, Focal/Weakness. Psychiatric Denies complaints or symptoms of Anxiety, Claustrophobia. Objective Constitutional Sitting or standing Blood Pressure is within target range for patient.. Pulse regular and within target range for patient.Marland Kitchen Respirations regular, non- labored and within target range.. Temperature is normal and within the target range for the patient.Marland Kitchen appears in no distress. Vitals Time Taken: 10:34 AM, Height: 64 in, Source: Stated, Weight: 100 lbs, Source: Stated, BMI: 17.2, Temperature: 98.6 F, Pulse: 55 bpm, Respiratory Rate: 16 breaths/min, Blood Pressure: 110/68 mmHg. Ears, Nose, Mouth, and Throat Patient is hard of  hearing.. General Notes: Wound exam; The patient has a more superficial wound on the left clean granulated surface. No debridement is necessary on the right some  surface slough and adherent debris I removed this with a #3 curette hemostasis with direct pressure. No evidence of infection in either area. Integumentary (Hair, Skin) Wound #1 status is Open. Original cause of wound was Gradually Appeared. The date acquired was: 01/12/2021. The wound is located on the Left Calcaneus. The wound measures 1.2cm length x 0.8cm width x 0.3cm depth; 0.754cm^2 area and 0.226cm^3 volume. There is Fat Layer (Subcutaneous Tissue) exposed. There is no tunneling or undermining noted. There is a medium amount of serosanguineous drainage noted. There is medium (34-66%) red, pink, pale granulation within the wound bed. There is a medium (34-66%) amount of necrotic tissue within the wound bed including Adherent Slough. Wound #2 status is Open. Original cause of wound was Gradually Appeared. The date acquired was: 01/12/2021. The wound is located on the Right Calcaneus. The wound measures 0.6cm length x 0.8cm width x 0.3cm depth; 0.377cm^2 area and 0.113cm^3 volume. There is Fat Layer (Subcutaneous Tissue) exposed. There is no tunneling or undermining noted. There is a medium amount of serosanguineous drainage noted. There is medium (34-66%) red, pink, pale granulation within the wound bed. There is a medium (34-66%) amount of necrotic tissue within the wound bed including Adherent Slough. Assessment Active Problems ICD-10 Pressure ulcer of right heel, stage 3 Pressure ulcer of left heel, stage 3 [place], Chrystel ([id number]) Procedures Wound #2 Pre-procedure diagnosis of Wound #2 is a Pressure Ulcer located on the Right Calcaneus . There was a Excisional Skin/Subcutaneous Tissue Debridement with a total area of 0.48 sq cm performed by Maxwell Caul, MD. With the following instrument(s): Curette to remove Viable and Non-Viable tissue/material. Material removed includes Subcutaneous Tissue, Slough, Skin: Dermis, and Skin: Epidermis. No specimens were taken. A time out was  conducted at 11:16, prior to the start of the procedure. A Moderate amount of bleeding was controlled with Pressure. The procedure was tolerated well. Post Debridement Measurements: 0.6cm length x 0.8cm width x 0.4cm depth; 0.151cm^3 volume. Post debridement Stage noted as Category/Stage III. Character of Wound/Ulcer Post Debridement is stable. Post procedure Diagnosis Wound #2: Same as Pre-Procedure Plan Follow-up Appointments: Return Appointment in 2 weeks. Bathing/ Shower/ Hygiene: May shower; gently cleanse wound with antibacterial soap, rinse and pat dry prior to dressing wounds Off-Loading: Turn and reposition every 2 hours - Keep booties on feet while in the bed, float heels. Float heels when sitting in wheelchair. WOUND #1: - Calcaneus Wound Laterality: Left Cleanser: Wound Cleanser 3 x Per Week/30 Days Discharge Instructions: Wash your hands with soap and water. Remove old dressing, discard into plastic bag and place into trash. Cleanse the wound with Wound Cleanser prior to applying a clean dressing using gauze sponges, not tissues or cotton balls. Do not scrub or use excessive force. Pat dry using gauze sponges, not tissue or cotton balls. Primary Dressing: Prisma 4.34 (in) 3 x Per Week/30 Days Discharge Instructions: Moisten w/normal saline or sterile water; Cover wound as directed. Do not remove from wound bed. Secondary Dressing: Mepilex Border Flex, 4x4 (in/in) 3 x Per Week/30 Days Discharge Instructions: Apply to wound as directed. Do not cut. #1 we are going to use collagen with a Mepilex border, change every 2 2. Any benefit the Santyl I think is been previously realized. 3. The wound is worse on the right today has more depth and require debridement  however the left looks clean and did not require any debridement 4. Neither area looks infected. The right as mentioned is deeper but does not probe to bone 5. At this point I did not think this required culturing or  x-rays 6. Although the patient is technically noncompressible bilaterally. Both her dorsalis pedis and posterior tibial pulses were palpable although may be reduced. Popliteal pulses were vibrant Electronic Signature(s) Signed: 04/07/2021 5:04:28 PM By: Baltazar Najjar MD Entered By: Baltazar Najjar on 04/07/2021 12:34:04 [name] [name] ([id number]) -------------------------------------------------------------------------------- ROS/PFSH Details Patient Name: [name] [name] Date of Service: 04/07/2021 10:15 AM Medical Record Number: [id number] Patient Account Number: [id number] Date of Birth/Sex: 1918-09-14 (87 y.o. F) Treating RN: Hansel Feinstein Primary Care Provider: Einar Crow Other Clinician: Lolita Cram Referring Provider: Leron Croak Treating Provider/Extender: Altamese [place] in Treatment: 0 Information Obtained From Patient Constitutional Symptoms (General Health) Complaints and Symptoms: Negative for: Fatigue; Fever; Chills; Marked Weight Change Eyes Complaints and Symptoms: Positive for: Glasses / Contacts Negative for: Dry Eyes; Vision Changes Medical History: Negative for: Cataracts; Glaucoma; Optic Neuritis Ear/Nose/Mouth/Throat Complaints and Symptoms: Negative for: Difficult clearing ears; Sinusitis Medical History: Negative for: Chronic sinus problems/congestion; Middle ear problems Hematologic/Lymphatic Complaints and Symptoms: Negative for: Bleeding / Clotting Disorders; Human Immunodeficiency Virus Medical History: Negative for: Anemia; Hemophilia; Human Immunodeficiency Virus; Lymphedema; Sickle Cell Disease Respiratory Complaints and Symptoms: Negative for: Chronic or frequent coughs; Shortness of Breath Medical History: Positive for: Chronic Obstructive Pulmonary Disease (COPD) Negative for: Aspiration; Asthma; Pneumothorax; Sleep Apnea; Tuberculosis Cardiovascular Complaints and Symptoms: Negative for: Chest pain; LE edema Medical  History: Negative for: Angina; Arrhythmia; Congestive Heart Failure; Coronary Artery Disease; Deep Vein Thrombosis; Hypertension; Hypotension; Myocardial Infarction; Peripheral Arterial Disease; Peripheral Venous Disease; Phlebitis; Vasculitis Gastrointestinal Complaints and Symptoms: Negative for: Frequent diarrhea; Nausea; Vomiting Medical History: Negative for: Cirrhosis ; Colitis; Crohnos; Hepatitis A; Hepatitis B; Hepatitis C Endocrine Vitrano, Janiesha ([id number]) Complaints and Symptoms: Negative for: Hepatitis; Thyroid disease; Polydypsia (Excessive Thirst) Medical History: Negative for: Type I Diabetes; Type II Diabetes Genitourinary Complaints and Symptoms: Negative for: Kidney failure/ Dialysis; Incontinence/dribbling Medical History: Negative for: End Stage Renal Disease Immunological Complaints and Symptoms: Negative for: Hives; Itching Medical History: Negative for: Lupus Erythematosus; Raynaudos; Scleroderma Integumentary (Skin) Complaints and Symptoms: Positive for: Wounds Negative for: Bleeding or bruising tendency; Breakdown; Swelling Medical History: Negative for: History of Burn; History of pressure wounds Musculoskeletal Complaints and Symptoms: Negative for: Muscle Pain; Muscle Weakness Medical History: Negative for: Gout; Rheumatoid Arthritis; Osteoarthritis; Osteomyelitis Neurologic Complaints and Symptoms: Negative for: Numbness/parasthesias; Focal/Weakness Medical History: Positive for: Dementia Negative for: Neuropathy; Quadriplegia; Paraplegia; Seizure Disorder Psychiatric Complaints and Symptoms: Negative for: Anxiety; Claustrophobia Medical History: Negative for: Anorexia/bulimia; Confinement Anxiety Oncologic Medical History: Negative for: Received Chemotherapy; Received Radiation Immunizations Pneumococcal Vaccine: Received Pneumococcal Vaccination: No Implantable Devices None Family and Social History ADDILYNN, MOWRER  ([id number]) Never smoker; Marital Status - Widowed; Alcohol Use: Never; Drug Use: No History; Caffeine Use: Never; Financial Concerns: No; Food, Clothing or Shelter Needs: No; Support System Lacking: No; Transportation Concerns: No Electronic Signature(s) Signed: 04/07/2021 2:06:04 PM By: Hansel Feinstein Signed: 04/07/2021 5:04:28 PM By: Baltazar Najjar MD Entered By: Hansel Feinstein on 04/07/2021 10:59:16 [name] [name] ([id number]) -------------------------------------------------------------------------------- SuperBill Details Patient Name: [name] [name] Date of Service: 04/07/2021 Medical Record Number: [id number] Patient Account Number: [id number] Date of Birth/Sex: 06/24/1918 (87 y.o. F) Treating RN: Huel Coventry Primary Care Provider: Einar Crow Other Clinician: Lolita Cram Referring Provider: Leron Croak Treating Provider/Extender: Altamese [place] in Treatment: 0  Diagnosis Coding ICD-10 Codes Code Description L89.613 Pressure ulcer of right heel, stage 3 L89.623 Pressure ulcer of left heel, stage 3 Facility Procedures CPT4 Code: [id number] Description: 99213 - WOUND CARE VISIT-LEV 3 EST PT Modifier: Quantity: 1 CPT4 Code: 34193790 Description: 11042 - DEB SUBQ TISSUE 20 SQ CM/< Modifier: Quantity: 1 CPT4 Code: Description: ICD-10 Diagnosis Description L89.613 Pressure ulcer of right heel, stage 3 Modifier: Quantity: Physician Procedures CPT4 Code: 2409735 Description: WC PHYS LEVEL 3 o NEW PT Modifier: Quantity: 1 CPT4 Code: Description: ICD-10 Diagnosis Description L89.613 Pressure ulcer of right heel, stage 3 L89.623 Pressure ulcer of left heel, stage 3 Modifier: Quantity: CPT4 Code: [id number] Description: 11042 - WC PHYS SUBQ TISS 20 SQ CM Modifier: Quantity: 1 CPT4 Code: Description: ICD-10 Diagnosis Description L89.613 Pressure ulcer of right heel, stage 3 Modifier: Quantity: Electronic Signature(s) Signed: 04/07/2021 5:04:28 PM By: Baltazar Najjar MD Entered By: Baltazar Najjar on 04/07/2021 12:34:30

## 2021-04-21 ENCOUNTER — Encounter: Payer: Medicare Other | Attending: Internal Medicine | Admitting: Internal Medicine

## 2021-04-21 ENCOUNTER — Other Ambulatory Visit: Payer: Self-pay

## 2021-04-21 DIAGNOSIS — E039 Hypothyroidism, unspecified: Secondary | ICD-10-CM | POA: Diagnosis not present

## 2021-04-21 DIAGNOSIS — J449 Chronic obstructive pulmonary disease, unspecified: Secondary | ICD-10-CM | POA: Diagnosis not present

## 2021-04-21 DIAGNOSIS — G309 Alzheimer's disease, unspecified: Secondary | ICD-10-CM | POA: Diagnosis not present

## 2021-04-21 DIAGNOSIS — L89623 Pressure ulcer of left heel, stage 3: Secondary | ICD-10-CM | POA: Insufficient documentation

## 2021-04-21 DIAGNOSIS — Z9981 Dependence on supplemental oxygen: Secondary | ICD-10-CM | POA: Insufficient documentation

## 2021-04-21 DIAGNOSIS — F0280 Dementia in other diseases classified elsewhere without behavioral disturbance: Secondary | ICD-10-CM | POA: Insufficient documentation

## 2021-04-21 DIAGNOSIS — L89613 Pressure ulcer of right heel, stage 3: Secondary | ICD-10-CM | POA: Insufficient documentation

## 2021-04-22 NOTE — Progress Notes (Signed)
[name] [name], [name] [name] ([id number]) Visit Report for 04/21/2021 Arrival Information Details Patient Name: [name] [name], [name] [name] Date of Service: 04/21/2021 10:00 AM Medical Record Number: [id number] Patient Account Number: [id number] Date of Birth/Sex: 01/27/1918 (87 y.o. F) Treating RN: Hansel FeinsteinBishop, Joy Primary Care Alyxander Kollmann: Einar CrowAnderson, Marshall Other Clinician: Referring Mackensey Bolte: Einar CrowAnderson, Marshall Treating Dasiah Hooley/Extender: Altamese CarolinaOBSON, MICHAEL G Weeks in Treatment: 2 Visit Information History Since Last Visit Added or deleted any medications: No Patient Arrived: Wheel Chair Had a fall or experienced change in No Arrival Time: 09:51 activities of daily living that may affect Accompanied By: POA risk of falls: Transfer Assistance: Manual Hospitalized since last visit: No Patient Identification Verified: Yes Has Dressing in Place as Prescribed: No Secondary Verification Process Completed: Yes Pain Present Now: No Patient Has Alerts: Yes Patient Alerts: NOT diabetic 2 person pivot Electronic Signature(s) Signed: 04/21/2021 12:22:13 PM By: Hansel FeinsteinBishop, Joy Entered By: Hansel FeinsteinBishop, Joy on 04/21/2021 09:57:33 [name] [name], [name] [name] ([id number]) -------------------------------------------------------------------------------- Clinic Level of Care Assessment Details Patient Name: [name] [name], [name] [name] Date of Service: 04/21/2021 10:00 AM Medical Record Number: [id number] Patient Account Number: [id number] Date of Birth/Sex: 10/03/1918 (87 y.o. F) Treating RN: Huel CoventryWoody, Kim Primary Care Ladarrian Asencio: Einar CrowAnderson, Marshall Other Clinician: Referring Essie Lagunes: Einar CrowAnderson, Marshall Treating [name] [name] Connelley/Extender: Altamese CarolinaOBSON, MICHAEL G Weeks in Treatment: 2 Clinic Level of Care Assessment Items TOOL 4 Quantity Score []  - Use when only an EandM is performed on FOLLOW-UP visit 0 ASSESSMENTS - Nursing Assessment / Reassessment X - Reassessment of Co-morbidities (includes updates in patient status) 1 10 X- 1 5 Reassessment of Adherence to Treatment  Plan ASSESSMENTS - Wound and Skin Assessment / Reassessment X - Simple Wound Assessment / Reassessment - one wound 1 5 []  - 0 Complex Wound Assessment / Reassessment - multiple wounds []  - 0 Dermatologic / Skin Assessment (not related to wound area) ASSESSMENTS - Focused Assessment []  - Circumferential Edema Measurements - multi extremities 0 []  - 0 Nutritional Assessment / Counseling / Intervention []  - 0 Lower Extremity Assessment (monofilament, tuning fork, pulses) []  - 0 Peripheral Arterial Disease Assessment (using hand held doppler) ASSESSMENTS - Ostomy and/or Continence Assessment and Care []  - Incontinence Assessment and Management 0 []  - 0 Ostomy Care Assessment and Management (repouching, etc.) PROCESS - Coordination of Care X - Simple Patient / Family Education for ongoing care 1 15 []  - 0 Complex (extensive) Patient / Family Education for ongoing care X- 1 10 Staff obtains Consents, Records, Test Results / Process Orders []  - 0 Staff telephones HHA, Nursing Homes / Clarify orders / etc []  - 0 Routine Transfer to another Facility (non-emergent condition) []  - 0 Routine Hospital Admission (non-emergent condition) []  - 0 New Admissions / Manufacturing engine[employer]surance Authorizations / Ordering NPWT, Apligraf, etc. []  - 0 Emergency Hospital Admission (emergent condition) X- 1 10 Simple Discharge Coordination []  - 0 Complex (extensive) Discharge Coordination PROCESS - Special Needs []  - Pediatric / Minor Patient Management 0 []  - 0 Isolation Patient Management []  - 0 Hearing / Language / Visual special needs []  - 0 Assessment of Community assistance (transportation, D/C planning, etc.) []  - 0 Additional assistance / Altered mentation []  - 0 Support Surface(s) Assessment (bed, cushion, seat, etc.) INTERVENTIONS - Wound Cleansing / Measurement Treptow, Rashonda ([id number]) []  - 0 Simple Wound Cleansing - one wound X- 1 5 Complex Wound Cleansing - multiple wounds X- 1 5 Wound  Imaging (photographs - any number of wounds) []  - 0 Wound Tracing (instead of photographs) []  - 0 Simple Wound Measurement - one wound X- 2 5 Complex Wound Measurement -  multiple wounds INTERVENTIONS - Wound Dressings []  - Small Wound Dressing one or multiple wounds 0 X- 2 15 Medium Wound Dressing one or multiple wounds []  - 0 Large Wound Dressing one or multiple wounds []  - 0 Application of Medications - topical []  - 0 Application of Medications - injection INTERVENTIONS - Miscellaneous []  - External ear exam 0 []  - 0 Specimen Collection (cultures, biopsies, blood, body fluids, etc.) []  - 0 Specimen(s) / Culture(s) sent or taken to Lab for analysis []  - 0 Patient Transfer (multiple staff / / Similar devices) []  - 0 Simple Staple / Suture removal (25 or less) []  - 0 Complex Staple / Suture removal (26 or more) []  - 0 Hypo / Hyperglycemic Management (close monitor of Blood Glucose) []  - 0 Ankle / Brachial Index (ABI) - do not check if billed separately X- 1 5 Vital Signs Has the patient been seen at the hospital within the last three years: Yes Total Score: 110 Level Of Care: New/Established - Level 3 Electronic Signature(s) Signed: 04/21/2021 6:06:01 PM By: , BSN, RN, CWS, Kim RN, BSN Entered By: , BSN, RN, CWS, Kim on 04/21/2021 10:12:44 ( ) -------------------------------------------------------------------------------- Lower Extremity Assessment Details Patient Name: Date of Service: 04/21/2021 10:00 AM Medical Record Number: Patient Account Number: Date of Birth/Sex: [birth year] (87 y.o. F) Treating RN: Primary Care Marcel Gary: 06/21/2021 Other Clinician: Referring Copelan Maultsby: Elliot Gurney Treating Kyaire Gruenewald/Extender: Elliot Gurney in Treatment: 2 Edema Assessment Assessed: [Left: Yes] [Right: Yes] [Left: Edema] [Right: :] Calf Left: Right: Point  of Measurement: 40 cm From Medial Instep 26 cm 25.5 cm Ankle Left: Right: Point of Measurement: 10 cm From Medial Instep 22 cm 21 cm Vascular Assessment Pulses: Dorsalis Pedis Palpable: [Left:Yes] [Right:Yes] Electronic Signature(s) Signed: 04/21/2021 12:22:13 PM By: [name] [name] Entered By: [id number] on 04/21/2021 10:05:36 06/21/2021 ([id number]) -------------------------------------------------------------------------------- Multi Wound Chart Details Patient Name: [id number] Date of Service: 04/21/2021 10:00 AM Medical Record Number: Hansel Feinstein Patient Account Number: Einar Crow Date of Birth/Sex: 02-08-1918 (87 y.o. F) Treating RN: Altamese Alice Primary Care Dolorez Jeffrey: 06/21/2021 Other Clinician: Referring Tiara Maultsby: Hansel Feinstein Treating Aradia Estey/Extender: Hansel Feinstein in Treatment: 2 Vital Signs Height(in): 64 Pulse(bpm): 76 Weight(lbs): 100 Blood Pressure(mmHg): 133/92 Body Mass Index(BMI): 17 Temperature(F): 98.1 Respiratory Rate(breaths/min): 20 Photos: [N/A:N/A] Wound Location: Left Calcaneus Right Calcaneus N/A Wounding Event: Gradually Appeared Gradually Appeared N/A Primary Etiology: Pressure Ulcer Pressure Ulcer N/A Comorbid History: Chronic Obstructive Pulmonary Chronic Obstructive Pulmonary N/A Disease (COPD), Dementia Disease (COPD), Dementia Date Acquired: 01/12/2021 01/12/2021 N/A Weeks of Treatment: 2 2 N/A Wound Status: Open Open N/A Measurements L x W x D (cm) 1.2x0.8x0.3 0.5x0.7x0.3 N/A Area (cm) : 0.754 0.275 N/A Volume (cm) : 0.226 0.082 N/A % Reduction in Area: 0.00% 27.10% N/A % Reduction in Volume: 0.00% 27.40% N/A Classification: Category/Stage III Category/Stage III N/A Exudate Amount: Medium Medium N/A Exudate Type: Serosanguineous Serosanguineous N/A Exudate Color: red, brown red, brown N/A Granulation Amount: Large (67-100%) Medium (34-66%) N/A Granulation Quality: Red, Pink, Pale Red, Pink, Pale N/A Necrotic  Amount: Small (1-33%) Medium (34-66%) N/A Exposed Structures: Fat Layer (Subcutaneous Tissue): Fat Layer (Subcutaneous Tissue): N/A Yes Yes Fascia: No Fascia: No Tendon: No Tendon: No Muscle: No Muscle: No Joint: No Joint: No Bone: No Bone: No Epithelialization: None None N/A Treatment Notes Electronic Signature(s) Signed: 04/21/2021 4:52:48 PM By: [name] Rower MD Entered By: 06/21/2021 on 04/21/2021 10:14:53 [id number] (07/16/1918) -------------------------------------------------------------------------------- Multi-Disciplinary Care Plan  Details Patient Name: BAILIE, [place] Date of Service: 04/21/2021 10:00 AM Medical Record Number: [id number] Patient Account Number: [id number] Date of Birth/Sex: 22-Aug-1918 (87 y.o. F) Treating RN: Huel Coventry Primary Care Manna Gose: Einar Crow Other Clinician: Referring Lowell Makara: Einar Crow Treating Jabier Deese/Extender: Altamese [place] in Treatment: 2 Active Inactive Necrotic Tissue Nursing Diagnoses: Impaired tissue integrity related to necrotic/devitalized tissue Goals: Necrotic/devitalized tissue will be minimized in the wound bed Date Initiated: 04/07/2021 Target Resolution Date: 04/15/2021 Goal Status: Active Interventions: Assess patient pain level pre-, during and post procedure and prior to discharge Treatment Activities: Apply topical anesthetic as ordered : 04/07/2021 Notes: Orientation to the Wound Care Program Nursing Diagnoses: Knowledge deficit related to the wound healing center program Goals: Patient/caregiver will verbalize understanding of the Wound Healing Center Program Date Initiated: 04/07/2021 Target Resolution Date: 04/15/2021 Goal Status: Active Interventions: Provide education on orientation to the wound center Notes: Pressure Nursing Diagnoses: Knowledge deficit related to management of pressures ulcers Goals: Patient will remain free from development of additional  pressure ulcers Date Initiated: 04/07/2021 Target Resolution Date: 04/07/2021 Goal Status: Active Patient will remain free of pressure ulcers Date Initiated: 04/07/2021 Target Resolution Date: 04/07/2021 Goal Status: Active Interventions: Provide education on pressure ulcers Notes: Wound/Skin Impairment Nursing Diagnoses: Impaired tissue integrity OBELIA, BONELLO ([id number]) Goals: Ulcer/skin breakdown will have a volume reduction of 30% by week 4 Date Initiated: 04/07/2021 Target Resolution Date: 05/07/2021 Goal Status: Active Interventions: Assess ulceration(s) every visit Notes: Electronic Signature(s) Signed: 04/21/2021 6:06:01 PM By: Elliot Gurney, BSN, RN, CWS, Kim RN, BSN Entered By: Elliot Gurney, BSN, RN, CWS, Kim on 04/21/2021 10:08:54 [name] [name] ([id number]) -------------------------------------------------------------------------------- Pain Assessment Details Patient Name: [name] [name] Date of Service: 04/21/2021 10:00 AM Medical Record Number: [id number] Patient Account Number: [id number] Date of Birth/Sex: Jan 10, 1918 (87 y.o. F) Treating RN: Hansel Feinstein Primary Care Karsten Howry: Einar Crow Other Clinician: Referring Jiya Kissinger: Einar Crow Treating Dajion Bickford/Extender: Altamese [place] in Treatment: 2 Active Problems Location of Pain Severity and Description of Pain Patient Has Paino No Site Locations Rate the pain. Current Pain Level: 0 Pain Management and Medication Current Pain Management: Electronic Signature(s) Signed: 04/21/2021 12:22:13 PM By: Hansel Feinstein Entered By: Hansel Feinstein on 04/21/2021 09:59:56 [name] [name] ([id number]) -------------------------------------------------------------------------------- Wound Assessment Details Patient Name: [name] [name] Date of Service: 04/21/2021 10:00 AM Medical Record Number: [id number] Patient Account Number: [id number] Date of Birth/Sex: Feb 10, 1918 (87 y.o. F) Treating RN: Hansel Feinstein Primary Care  Nichlos Kunzler: Einar Crow Other Clinician: Referring Eara Burruel: Einar Crow Treating Donyell Ding/Extender: Altamese [place] in Treatment: 2 Wound Status Wound Number: 1 Primary Etiology: Pressure Ulcer Wound Location: Left Calcaneus Wound Status: Open Wounding Event: Gradually Appeared Comorbid Chronic Obstructive Pulmonary Disease (COPD), History: Dementia Date Acquired: 01/12/2021 Weeks Of Treatment: 2 Clustered Wound: No Photos Wound Measurements Length: (cm) 1.2 Width: (cm) 0.8 Depth: (cm) 0.3 Area: (cm) 0.754 Volume: (cm) 0.226 % Reduction in Area: 0% % Reduction in Volume: 0% Epithelialization: None Tunneling: No Undermining: No Wound Description Classification: Category/Stage III Exudate Amount: Medium Exudate Type: Serosanguineous Exudate Color: red, brown Foul Odor After Cleansing: No Slough/Fibrino Yes Wound Bed Granulation Amount: Large (67-100%) Exposed Structure Granulation Quality: Red, Pink, Pale Fascia Exposed: No Necrotic Amount: Small (1-33%) Fat Layer (Subcutaneous Tissue) Exposed: Yes Necrotic Quality: Adherent Slough Tendon Exposed: No Muscle Exposed: No Joint Exposed: No Bone Exposed: No Electronic Signature(s) Signed: 04/21/2021 12:22:13 PM By: Hansel Feinstein Entered By: Hansel Feinstein on 04/21/2021 10:03:52 [name] [name] ([id number]) -------------------------------------------------------------------------------- Wound Assessment Details Patient Name: [name] [name] Date of Service:  04/21/2021 10:00 AM Medical Record Number: [id number] Patient Account Number: [id number] Date of Birth/Sex: 07-Feb-1918 (87 y.o. F) Treating RN: Hansel Feinstein Primary Care Anacristina Steffek: Einar Crow Other Clinician: Referring Tyliek Timberman: Einar Crow Treating Saniyya Gau/Extender: Altamese [place] in Treatment: 2 Wound Status Wound Number: 2 Primary Etiology: Pressure Ulcer Wound Location: Right Calcaneus Wound Status: Open Wounding Event:  Gradually Appeared Comorbid Chronic Obstructive Pulmonary Disease (COPD), History: Dementia Date Acquired: 01/12/2021 Weeks Of Treatment: 2 Clustered Wound: No Photos Wound Measurements Length: (cm) 0.5 Width: (cm) 0.7 Depth: (cm) 0.3 Area: (cm) 0.275 Volume: (cm) 0.082 % Reduction in Area: 27.1% % Reduction in Volume: 27.4% Epithelialization: None Tunneling: No Undermining: No Wound Description Classification: Category/Stage III Exudate Amount: Medium Exudate Type: Serosanguineous Exudate Color: red, brown Foul Odor After Cleansing: No Slough/Fibrino Yes Wound Bed Granulation Amount: Medium (34-66%) Exposed Structure Granulation Quality: Red, Pink, Pale Fascia Exposed: No Necrotic Amount: Medium (34-66%) Fat Layer (Subcutaneous Tissue) Exposed: Yes Necrotic Quality: Adherent Slough Tendon Exposed: No Muscle Exposed: No Joint Exposed: No Bone Exposed: No Electronic Signature(s) Signed: 04/21/2021 12:22:13 PM By: Hansel Feinstein Entered By: Hansel Feinstein on 04/21/2021 10:03:00 [name] [name] ([id number]) -------------------------------------------------------------------------------- Vitals Details Patient Name: [name] [name] Date of Service: 04/21/2021 10:00 AM Medical Record Number: [id number] Patient Account Number: [id number] Date of Birth/Sex: 1918-09-02 (87 y.o. F) Treating RN: Hansel Feinstein Primary Care Preciliano Castell: Einar Crow Other Clinician: Referring Mikias Lanz: Einar Crow Treating Zymier Rodgers/Extender: Altamese [place] in Treatment: 2 Vital Signs Time Taken: 09:58 Temperature (F): 98.1 Height (in): 64 Pulse (bpm): 76 Weight (lbs): 100 Respiratory Rate (breaths/min): 20 Body Mass Index (BMI): 17.2 Blood Pressure (mmHg): 133/92 Reference Range: 80 - 120 mg / dl Electronic Signature(s) Signed: 04/21/2021 12:22:13 PM By: Hansel Feinstein Entered ByHansel Feinstein on 04/21/2021 09:59:11

## 2021-04-22 NOTE — Progress Notes (Signed)
JOSEE, SPEECE ([id number]) Visit Report for 04/21/2021 HPI Details Patient Name: [name] [name], [name] [name] Date of Service: 04/21/2021 10:00 AM Medical Record Number: [id number] Patient Account Number: [id number] Date of Birth/Sex: 20-Aug-1918 (87 y.o. F) Treating RN: Huel Coventry Primary Care Provider: Einar Crow Other Clinician: Referring Provider: Einar Crow Treating Provider/Extender: Altamese [place] in Treatment: 2 History of Present Illness HPI Description: ADMISSION 04/07/2021 This is a 87 year old woman who is currently a resident at [facility] skilled facility. She is here with her niece who is her primary family member. Prior to December she also was her caregiver. Apparently she was hospitalized from 11/06/2020 through 11/13/2020 with a right femoral neck fracture requiring a hip replacement. She was sent to [facility] for rehabilitation. She developed bilateral heel pressure ulcers over the tip of her heels. She is nonambulatory. Her niece is uncertain about the degree of pressure relief. However they have been using Santyl as the primary dressing for about 2 months. Past medical history includes COPD now O2 dependent, dementia, hypothyroidism, Alzheimer's disease, history of protein calorie malnutrition although her albumin while in the hospital was 3.4, she has essential tremor although she is on Sinemet and has been for many years, hypothyroidism. She is nonambulatory. ABIs in our clinic were 1.45 on the right and 1.36 on the left 5/11; patient with bilateral pressure ulcers on the tips of her heels. We have been using silver collagen. She is at the facility. In spite of concerns that her dressings do not get changed on schedule and she is not wearing her boots at night, the surface area is actually smaller. Electronic Signature(s) Signed: 04/21/2021 4:52:48 PM By: Baltazar Najjar MD Entered By: Baltazar Najjar on 04/21/2021 10:15:46 [name] [name]  ([id number]) -------------------------------------------------------------------------------- Physical Exam Details Patient Name: [name] [name] Date of Service: 04/21/2021 10:00 AM Medical Record Number: [id number] Patient Account Number: [id number] Date of Birth/Sex: July 29, 1918 (87 y.o. F) Treating RN: Huel Coventry Primary Care Provider: Einar Crow Other Clinician: Referring Provider: Einar Crow Treating Provider/Extender: Altamese [place] in Treatment: 2 Constitutional Patient is hypertensive.. Pulse regular and within target range for patient.Marland Kitchen Respirations regular, non-labored and within target range.. Temperature is normal and within the target range for the patient.Marland Kitchen appears in no distress. Cardiovascular Pedal pulses palpable bilaterally at the dorsalis pedis and posterior tibial. They are not robust, but definitely palpable. Notes Wound exam; the patient has bilateral pressure ulcers on the tips of her heels. Surface area both wounds look satisfactory to me. No debridement was felt to be necessary at least mechanical debridement. No evidence of surrounding infection Electronic Signature(s) Signed: 04/21/2021 4:52:48 PM By: Baltazar Najjar MD Entered By: Baltazar Najjar on 04/21/2021 10:18:14 [name] [name] ([id number]) -------------------------------------------------------------------------------- Physician Orders Details Patient Name: [name] [name] Date of Service: 04/21/2021 10:00 AM Medical Record Number: [id number] Patient Account Number: [id number] Date of Birth/Sex: [birth year] (87 y.o. F) Treating RN: Huel Coventry Primary Care Provider: Einar Crow Other Clinician: Referring Provider: Einar Crow Treating Provider/Extender: Altamese [place] in Treatment: 2 Verbal / Phone Orders: No Diagnosis Coding Follow-up Appointments o Return Appointment in 2 weeks. Bathing/ Shower/ Hygiene o May shower; gently cleanse wound with  antibacterial soap, rinse and pat dry prior to dressing wounds Off-Loading o Turn and reposition every 2 hours - Keep off-loading (ie: sage boots) booties on feet while in the bed, float heels. Float heels when sitting in wheelchair. Wound Treatment Wound #1 - Calcaneus Wound Laterality: Left Cleanser: Wound Cleanser 3 x Per Week/30 Days Discharge Instructions: [facility]  your hands with soap and water. Remove old dressing, discard into plastic bag and place into trash. Cleanse the wound with Wound Cleanser prior to applying a clean dressing using gauze sponges, not tissues or cotton balls. Do not scrub or use excessive force. Pat dry using gauze sponges, not tissue or cotton balls. Primary Dressing: Prisma 4.34 (in) 3 x Per Week/30 Days Discharge Instructions: Moisten w/normal saline or sterile water; Cover wound as directed. Do not remove from wound bed. Secondary Dressing: Mepilex Border Flex, 4x4 (in/in) 3 x Per Week/30 Days Discharge Instructions: Apply to wound as directed. Do not cut. Wound #2 - Calcaneus Wound Laterality: Right Cleanser: Wound Cleanser 3 x Per Week/30 Days Discharge Instructions: Wash your hands with soap and water. Remove old dressing, discard into plastic bag and place into trash. Cleanse the wound with Wound Cleanser prior to applying a clean dressing using gauze sponges, not tissues or cotton balls. Do not scrub or use excessive force. Pat dry using gauze sponges, not tissue or cotton balls. Primary Dressing: Prisma 4.34 (in) 3 x Per Week/30 Days Discharge Instructions: Moisten w/normal saline or sterile water; Cover wound as directed. Do not remove from wound bed. Secondary Dressing: Mepilex Border Flex, 4x4 (in/in) 3 x Per Week/30 Days Discharge Instructions: Apply to wound as directed. Do not cut. Electronic Signature(s) Signed: 04/21/2021 4:52:48 PM By: Baltazar Najjar MD Signed: 04/21/2021 6:06:01 PM By: Elliot Gurney, BSN, RN, CWS, Kim RN, BSN Entered By: Elliot Gurney, BSN,  RN, CWS, Kim on 04/21/2021 10:12:02 [name] [name] ([id number]) -------------------------------------------------------------------------------- Problem List Details Patient Name: [name] [name] Date of Service: 04/21/2021 10:00 AM Medical Record Number: [id number] Patient Account Number: [id number] Date of Birth/Sex: 1918-08-29 (87 y.o. F) Treating RN: Huel Coventry Primary Care Provider: Einar Crow Other Clinician: Referring Provider: Einar Crow Treating Provider/Extender: Altamese [place] in Treatment: 2 Active Problems ICD-10 Encounter Code Description Active Date MDM Diagnosis L89.613 Pressure ulcer of right heel, stage 3 04/07/2021 No Yes L89.623 Pressure ulcer of left heel, stage 3 04/07/2021 No Yes Inactive Problems Resolved Problems Electronic Signature(s) Signed: 04/21/2021 4:52:48 PM By: Baltazar Najjar MD Entered By: Baltazar Najjar on 04/21/2021 10:14:44 [name] [name] ([id number]) -------------------------------------------------------------------------------- Progress Note Details Patient Name: [name] [name] Date of Service: 04/21/2021 10:00 AM Medical Record Number: [id number] Patient Account Number: [id number] Date of Birth/Sex: 02-25-1918 (87 y.o. F) Treating RN: Huel Coventry Primary Care Provider: Einar Crow Other Clinician: Referring Provider: Einar Crow Treating Provider/Extender: Altamese [place] in Treatment: 2 Subjective History of Present Illness (HPI) ADMISSION 04/07/2021 This is a 87 year old woman who is currently a resident at [facility] skilled facility. She is here with her niece who is her primary family member. Prior to December she also was her caregiver. Apparently she was hospitalized from 11/06/2020 through 11/13/2020 with a right femoral neck fracture requiring a hip replacement. She was sent to [facility] for rehabilitation. She developed bilateral heel pressure ulcers over the tip of her  heels. She is nonambulatory. Her niece is uncertain about the degree of pressure relief. However they have been using Santyl as the primary dressing for about 2 months. Past medical history includes COPD now O2 dependent, dementia, hypothyroidism, Alzheimer's disease, history of protein calorie malnutrition although her albumin while in the hospital was 3.4, she has essential tremor although she is on Sinemet and has been for many years, hypothyroidism. She is nonambulatory. ABIs in our clinic were 1.45 on the right and 1.36 on the left 5/11; patient with bilateral pressure ulcers on the  tips of her heels. We have been using silver collagen. She is at the facility. In spite of concerns that her dressings do not get changed on schedule and she is not wearing her boots at night, the surface area is actually smaller. Objective Constitutional Patient is hypertensive.. Pulse regular and within target range for patient.Marland Kitchen Respirations regular, non-labored and within target range.. Temperature is normal and within the target range for the patient.Marland Kitchen appears in no distress. Vitals Time Taken: 9:58 AM, Height: 64 in, Weight: 100 lbs, BMI: 17.2, Temperature: 98.1 F, Pulse: 76 bpm, Respiratory Rate: 20 breaths/min, Blood Pressure: 133/92 mmHg. Cardiovascular Pedal pulses palpable bilaterally at the dorsalis pedis and posterior tibial. They are not robust, but definitely palpable. General Notes: Wound exam; the patient has bilateral pressure ulcers on the tips of her heels. Surface area both wounds look satisfactory to me. No debridement was felt to be necessary at least mechanical debridement. No evidence of surrounding infection Integumentary (Hair, Skin) Wound #1 status is Open. Original cause of wound was Gradually Appeared. The date acquired was: 01/12/2021. The wound has been in treatment 2 weeks. The wound is located on the Left Calcaneus. The wound measures 1.2cm length x 0.8cm width x 0.3cm depth;  0.754cm^2 area and 0.226cm^3 volume. There is Fat Layer (Subcutaneous Tissue) exposed. There is no tunneling or undermining noted. There is a medium amount of serosanguineous drainage noted. There is large (67-100%) red, pink, pale granulation within the wound bed. There is a small (1-33%) amount of necrotic tissue within the wound bed including Adherent Slough. Wound #2 status is Open. Original cause of wound was Gradually Appeared. The date acquired was: 01/12/2021. The wound has been in treatment 2 weeks. The wound is located on the Right Calcaneus. The wound measures 0.5cm length x 0.7cm width x 0.3cm depth; 0.275cm^2 area and 0.082cm^3 volume. There is Fat Layer (Subcutaneous Tissue) exposed. There is no tunneling or undermining noted. There is a medium amount of serosanguineous drainage noted. There is medium (34-66%) red, pink, pale granulation within the wound bed. There is a medium (34-66%) amount of necrotic tissue within the wound bed including Adherent Slough. Assessment [name] [name], [name] [name] ([id number]) Active Problems ICD-10 Pressure ulcer of right heel, stage 3 Pressure ulcer of left heel, stage 3 Plan Follow-up Appointments: Return Appointment in 2 weeks. Bathing/ Shower/ Hygiene: May shower; gently cleanse wound with antibacterial soap, rinse and pat dry prior to dressing wounds Off-Loading: Turn and reposition every 2 hours - Keep off-loading (ie: sage boots) booties on feet while in the bed, float heels. Float heels when sitting in wheelchair. WOUND #1: - Calcaneus Wound Laterality: Left Cleanser: Wound Cleanser 3 x Per Week/30 Days Discharge Instructions: Wash your hands with soap and water. Remove old dressing, discard into plastic bag and place into trash. Cleanse the wound with Wound Cleanser prior to applying a clean dressing using gauze sponges, not tissues or cotton balls. Do not scrub or use excessive force. Pat dry using gauze sponges, not tissue or cotton  balls. Primary Dressing: Prisma 4.34 (in) 3 x Per Week/30 Days Discharge Instructions: Moisten w/normal saline or sterile water; Cover wound as directed. Do not remove from wound bed. Secondary Dressing: Mepilex Border Flex, 4x4 (in/in) 3 x Per Week/30 Days Discharge Instructions: Apply to wound as directed. Do not cut. WOUND #2: - Calcaneus Wound Laterality: Right Cleanser: Wound Cleanser 3 x Per Week/30 Days Discharge Instructions: Wash your hands with soap and water. Remove old dressing, discard into plastic bag and place  into trash. Cleanse the wound with Wound Cleanser prior to applying a clean dressing using gauze sponges, not tissues or cotton balls. Do not scrub or use excessive force. Pat dry using gauze sponges, not tissue or cotton balls. Primary Dressing: Prisma 4.34 (in) 3 x Per Week/30 Days Discharge Instructions: Moisten w/normal saline or sterile water; Cover wound as directed. Do not remove from wound bed. Secondary Dressing: Mepilex Border Flex, 4x4 (in/in) 3 x Per Week/30 Days Discharge Instructions: Apply to wound as directed. Do not cut. 1. Continue with Prisma and Mepilex border. 2. Major issue here is offloading these areas especially when in bed at night. We have put instructions in the orders with regards to this. She also apparently has bunny boots that may not be used with Programmer, applicationsreliability Electronic Signature(s) Signed: 04/21/2021 4:52:48 PM By: Baltazar Najjarobson, Namya Voges MD Entered By: Baltazar Najjarobson, Jay Haskew on 04/21/2021 10:19:47 [name] [name], [name] [name] ([id number]) -------------------------------------------------------------------------------- SuperBill Details Patient Name: [name] [name], [name] [name] Date of Service: 04/21/2021 Medical Record Number: [id number] Patient Account Number: [id number] Date of Birth/Sex: 02/07/1918 (87 y.o. F) Treating RN: Huel CoventryWoody, Kim Primary Care Provider: Einar CrowAnderson, Marshall Other Clinician: Referring Provider: Einar CrowAnderson, Marshall Treating Provider/Extender: Altamese CarolinaOBSON, Taysean Wager  G Weeks in Treatment: 2 Diagnosis Coding ICD-10 Codes Code Description [phone] Pressure ulcer of right heel, stage 3 L89.623 Pressure ulcer of left heel, stage 3 Facility Procedures CPT4 Code: [id number] Description: 99213 - WOUND CARE VISIT-LEV 3 EST PT Modifier: Quantity: 1 Physician Procedures CPT4 Code: [id number] Description: 99213 - WC PHYS LEVEL 3 - EST PT Modifier: Quantity: 1 CPT4 Code: Description: ICD-10 Diagnosis Description L89.613 Pressure ulcer of right heel, stage 3 L89.623 Pressure ulcer of left heel, stage 3 Modifier: Quantity: Electronic Signature(s) Signed: 04/21/2021 4:52:48 PM By: Baltazar Najjarobson, Orvin Netter MD Entered By: Baltazar Najjarobson, Gunnar Hereford on 04/21/2021 10:20:04

## 2021-05-05 ENCOUNTER — Encounter: Payer: Medicare Other | Admitting: Internal Medicine

## 2021-05-05 ENCOUNTER — Other Ambulatory Visit: Payer: Self-pay

## 2021-05-05 DIAGNOSIS — L89623 Pressure ulcer of left heel, stage 3: Secondary | ICD-10-CM | POA: Diagnosis not present

## 2021-05-06 NOTE — Progress Notes (Signed)
[name] [name], [name] [name] ([id number]) Visit Report for 05/05/2021 Debridement Details Patient Name: [name] [name], [name] [name] Date of Service: 05/05/2021 10:00 AM Medical Record Number: [id number] Patient Account Number: [id number] Date of Birth/Sex: [birth year] (87 y.o. F) Treating RN: Primary Care Provider: Einar Crow Other Clinician: Referring Provider: Einar Crow Treating Provider/Extender: Altamese [place] in Treatment: 4 Debridement Performed for Wound #2 Right Calcaneus Assessment: Performed By: Physician Maxwell Caul, MD Debridement Type: Debridement Level of Consciousness (Pre- Awake and Alert procedure): Pre-procedure Verification/Time Out Yes - 11:08 Taken: Total Area Debrided (L x W): 0.5 (cm) x 0.3 (cm) = 0.15 (cm) Tissue and other material Viable, Slough, Subcutaneous, Slough debrided: Level: Skin/Subcutaneous Tissue Debridement Description: Excisional Instrument: Curette Bleeding: Moderate Hemostasis Achieved: Pressure Response to Treatment: Procedure was tolerated well Level of Consciousness (Post- Awake and Alert procedure): Post Debridement Measurements of Total Wound Length: (cm) 0.5 Stage: Category/Stage III Width: (cm) 0.3 Depth: (cm) 0.5 Volume: (cm) 0.059 Character of Wound/Ulcer Post Debridement: Stable Post Procedure Diagnosis Same as Pre-procedure Electronic Signature(s) Signed: 05/06/2021 8:39:06 AM By: Baltazar Najjar MD Entered By: Baltazar Najjar on 05/05/2021 11:24:11 [name] [name] ([id number]) -------------------------------------------------------------------------------- HPI Details Patient Name: [name] [name] Date of Service: 05/05/2021 10:00 AM Medical Record Number: [id number] Patient Account Number: [id number] Date of Birth/Sex: 1918/09/17 (87 y.o. F) Treating RN: Primary Care Provider: Einar Crow Other Clinician: Referring Provider: Einar Crow Treating Provider/Extender: Altamese [place] in  Treatment: 4 History of Present Illness HPI Description: ADMISSION 04/07/2021 This is a 87 year old woman who is currently a resident at [facility] skilled facility. She is here with her niece who is her primary family member. Prior to December she also was her caregiver. Apparently she was hospitalized from 11/06/2020 through 11/13/2020 with a right femoral neck fracture requiring a hip replacement. She was sent to [facility] for rehabilitation. She developed bilateral heel pressure ulcers over the tip of her heels. She is nonambulatory. Her niece is uncertain about the degree of pressure relief. However they have been using Santyl as the primary dressing for about 2 months. Past medical history includes COPD now O2 dependent, dementia, hypothyroidism, Alzheimer's disease, history of protein calorie malnutrition although her albumin while in the hospital was 3.4, she has essential tremor although she is on Sinemet and has been for many years, hypothyroidism. She is nonambulatory. ABIs in our clinic were 1.45 on the right and 1.36 on the left 5/11; patient with bilateral pressure ulcers on the tips of her heels. We have been using silver collagen. She is at the facility. In spite of concerns that her dressings do not get changed on schedule and she is not wearing her boots at night, the surface area is actually smaller. 5/25; 2-week follow-up. The right heel continues to do well and is closing in. The left had subcutaneous debris on the surface and although it measures smaller is not anywhere close to healing. Electronic Signature(s) Signed: 05/06/2021 8:39:06 AM By: Baltazar Najjar MD Entered By: Baltazar Najjar on 05/05/2021 11:26:22 [name] [name] ([id number]) -------------------------------------------------------------------------------- Physician Orders Details Patient Name: [name] [name] Date of Service: 05/05/2021 10:00 AM Medical Record Number: [id number] Patient Account  Number: [id number] Date of Birth/Sex: [birth year] (87 y.o. F) Treating RN: Huel Coventry Primary Care Provider: Einar Crow Other Clinician: Referring Provider: Einar Crow Treating Provider/Extender: Altamese [place] in Treatment: 4 Verbal / Phone Orders: No Diagnosis Coding Follow-up Appointments o Return Appointment in 2 weeks. Bathing/ Shower/ Hygiene o May shower; gently cleanse wound with antibacterial soap, rinse and pat dry  prior to dressing wounds Off-Loading o Turn and reposition every 2 hours - Keep off-loading (ie: sage boots) booties on feet while in the bed, float heels. Float heels when sitting in wheelchair. Wound Treatment Wound #1 - Calcaneus Wound Laterality: Left Cleanser: Wound Cleanser 3 x Per Week/30 Days Discharge Instructions: Wash your hands with soap and water. Remove old dressing, discard into plastic bag and place into trash. Cleanse the wound with Wound Cleanser prior to applying a clean dressing using gauze sponges, not tissues or cotton balls. Do not scrub or use excessive force. Pat dry using gauze sponges, not tissue or cotton balls. Primary Dressing: Prisma 4.34 (in) 3 x Per Week/30 Days Discharge Instructions: Moisten w/normal saline or sterile water; Cover wound as directed. Do not remove from wound bed. Secondary Dressing: Mepilex Border Flex, 4x4 (in/in) 3 x Per Week/30 Days Discharge Instructions: Apply to wound as directed. Do not cut. Wound #2 - Calcaneus Wound Laterality: Right Cleanser: Wound Cleanser 3 x Per Week/30 Days Discharge Instructions: Wash your hands with soap and water. Remove old dressing, discard into plastic bag and place into trash. Cleanse the wound with Wound Cleanser prior to applying a clean dressing using gauze sponges, not tissues or cotton balls. Do not scrub or use excessive force. Pat dry using gauze sponges, not tissue or cotton balls. Primary Dressing: Prisma 4.34 (in) 3 x Per Week/30  Days Discharge Instructions: Moisten w/normal saline or sterile water; Cover wound as directed. Do not remove from wound bed. Secondary Dressing: Mepilex Border Flex, 4x4 (in/in) 3 x Per Week/30 Days Discharge Instructions: Apply to wound as directed. Do not cut. Electronic Signature(s) Signed: 05/06/2021 8:39:06 AM By: Baltazar Najjarobson, Enma Maeda MD Signed: 05/06/2021 9:33:55 AM By: Elliot GurneyWoody, BSN, RN, CWS, Kim RN, BSN Entered By: Elliot GurneyWoody, BSN, RN, CWS, Kim on 05/05/2021 11:10:00 [name] RowerWALKER, [name] [name] ([id number]) -------------------------------------------------------------------------------- Problem List Details Patient Name: [name] RowerWALKER, [name] [name] Date of Service: 05/05/2021 10:00 AM Medical Record Number: [id number] Patient Account Number: [id number] Date of Birth/Sex: 07/20/1918 (87 y.o. F) Treating RN: Primary Care Provider: Einar CrowAnderson, Marshall Other Clinician: Referring Provider: Einar CrowAnderson, Marshall Treating Provider/Extender: Altamese CarolinaOBSON, Jizel Cheeks G Weeks in Treatment: 4 Active Problems ICD-10 Encounter Code Description Active Date MDM Diagnosis L89.613 Pressure ulcer of right heel, stage 3 04/07/2021 No Yes L89.623 Pressure ulcer of left heel, stage 3 04/07/2021 No Yes Inactive Problems Resolved Problems Electronic Signature(s) Signed: 05/06/2021 8:39:06 AM By: Baltazar Najjarobson, Jaydian Santana MD Entered By: Baltazar Najjarobson, Roberta Angell on 05/05/2021 11:23:39 [name] RowerWALKER, [name] [name] ([id number]) -------------------------------------------------------------------------------- Progress Note Details Patient Name: [name] RowerWALKER, [name] [name] Date of Service: 05/05/2021 10:00 AM Medical Record Number: [id number] Patient Account Number: [id number] Date of Birth/Sex: 11/17/1918 (87 y.o. F) Treating RN: Primary Care Provider: Einar CrowAnderson, Marshall Other Clinician: Referring Provider: Einar CrowAnderson, Marshall Treating Provider/Extender: Altamese CarolinaOBSON, Paulanthony Gleaves G Weeks in Treatment: 4 Subjective History of Present Illness (HPI) ADMISSION 04/07/2021 This is a 87 year old woman who is  currently a resident at [facility]. She is here with her niece who is her primary family member. Prior to December she also was her caregiver. Apparently she was hospitalized from 11/06/2020 through 11/13/2020 with a right femoral neck fracture requiring a hip replacement. She was sent to [facility] for [facility]. She developed bilateral heel pressure ulcers over the tip of her heels. She is nonambulatory. Her niece is uncertain about the degree of pressure relief. However they have been using Santyl as the primary dressing for about 2 months. Past medical history includes COPD now O2 dependent, dementia, hypothyroidism, Alzheimer's disease, history of protein calorie malnutrition although  her albumin while in the hospital was 3.4, she has essential tremor although she is on Sinemet and has been for many years, hypothyroidism. She is nonambulatory. ABIs in our clinic were 1.45 on the right and 1.36 on the left 5/11; patient with bilateral pressure ulcers on the tips of her heels. We have been using silver collagen. She is at the facility. In spite of concerns that her dressings do not get changed on schedule and she is not wearing her boots at night, the surface area is actually smaller. 5/25; 2-week follow-up. The right heel continues to do well and is closing in. The left had subcutaneous debris on the surface and although it measures smaller is not anywhere close to healing. Objective Constitutional Vitals Time Taken: 10:43 AM, Height: 64 in, Weight: 100 lbs, BMI: 17.2, Temperature: 97.9 F, Pulse: 52 bpm, Respiratory Rate: 18 breaths/min, Blood Pressure: 121/53 mmHg. Integumentary (Hair, Skin) Wound #1 status is Open. Original cause of wound was Gradually Appeared. The date acquired was: 01/12/2021. The wound has been in treatment 4 weeks. The wound is located on the Left Calcaneus. The wound measures 1cm length x 0.6cm width x 0.6cm depth; 0.471cm^2 area  and 0.283cm^3 volume. There is Fat Layer (Subcutaneous Tissue) exposed. There is no tunneling or undermining noted. There is a medium amount of serous drainage noted. There is large (67-100%) red, pink, pale granulation within the wound bed. There is a small (1-33%) amount of necrotic tissue within the wound bed including Adherent Slough. Wound #2 status is Open. Original cause of wound was Gradually Appeared. The date acquired was: 01/12/2021. The wound has been in treatment 4 weeks. The wound is located on the Right Calcaneus. The wound measures 0.5cm length x 0.3cm width x 0.4cm depth; 0.118cm^2 area and 0.047cm^3 volume. There is Fat Layer (Subcutaneous Tissue) exposed. There is no tunneling or undermining noted. There is a medium amount of serous drainage noted. There is medium (34-66%) red, pink, pale granulation within the wound bed. There is a medium (34-66%) amount of necrotic tissue within the wound bed including Adherent Slough. Assessment Active Problems ICD-10 Pressure ulcer of right heel, stage 3 Pressure ulcer of left heel, stage 3 [name] [name], [name] [name] ([id number]) Procedures Wound #2 Pre-procedure diagnosis of Wound #2 is a Pressure Ulcer located on the Right Calcaneus . There was a Excisional Skin/Subcutaneous Tissue Debridement with a total area of 0.15 sq cm performed by Maxwell Caul, MD. With the following instrument(s): Curette to remove Viable tissue/material. Material removed includes Subcutaneous Tissue and Slough and. No specimens were taken. A time out was conducted at 11:08, prior to the start of the procedure. A Moderate amount of bleeding was controlled with Pressure. The procedure was tolerated well. Post Debridement Measurements: 0.5cm length x 0.3cm width x 0.5cm depth; 0.059cm^3 volume. Post debridement Stage noted as Category/Stage III. Character of Wound/Ulcer Post Debridement is stable. Post procedure Diagnosis Wound #2: Same as Pre-Procedure Plan Follow-up  Appointments: Return Appointment in 2 weeks. Bathing/ Shower/ Hygiene: May shower; gently cleanse wound with antibacterial soap, rinse and pat dry prior to dressing wounds Off-Loading: Turn and reposition every 2 hours - Keep off-loading (ie: sage boots) booties on feet while in the bed, float heels. Float heels when sitting in wheelchair. WOUND #1: - Calcaneus Wound Laterality: Left Cleanser: Wound Cleanser 3 x Per Week/30 Days Discharge Instructions: Wash your hands with soap and water. Remove old dressing, discard into plastic bag and place into trash. Cleanse the wound with Wound Cleanser prior  to applying a clean dressing using gauze sponges, not tissues or cotton balls. Do not scrub or use excessive force. Pat dry using gauze sponges, not tissue or cotton balls. Primary Dressing: Prisma 4.34 (in) 3 x Per Week/30 Days Discharge Instructions: Moisten w/normal saline or sterile water; Cover wound as directed. Do not remove from wound bed. Secondary Dressing: Mepilex Border Flex, 4x4 (in/in) 3 x Per Week/30 Days Discharge Instructions: Apply to wound as directed. Do not cut. WOUND #2: - Calcaneus Wound Laterality: Right Cleanser: Wound Cleanser 3 x Per Week/30 Days Discharge Instructions: Wash your hands with soap and water. Remove old dressing, discard into plastic bag and place into trash. Cleanse the wound with Wound Cleanser prior to applying a clean dressing using gauze sponges, not tissues or cotton balls. Do not scrub or use excessive force. Pat dry using gauze sponges, not tissue or cotton balls. Primary Dressing: Prisma 4.34 (in) 3 x Per Week/30 Days Discharge Instructions: Moisten w/normal saline or sterile water; Cover wound as directed. Do not remove from wound bed. Secondary Dressing: Mepilex Border Flex, 4x4 (in/in) 3 x Per Week/30 Days Discharge Instructions: Apply to wound as directed. Do not cut. 1. We continue with the same silver collagen-based dressing 2. Hopefully  the area on the right will be closed next time, I am not expecting the area on the left to be closed 3. We are covering this with border foam 4. She is in an SNF not certain about the offloading issues here but these are of paramount importance Electronic Signature(s) Signed: 05/06/2021 8:39:06 AM By: Baltazar Najjar MD Entered By: Baltazar Najjar on 05/05/2021 11:33:24 [name] [name] ([id number]) -------------------------------------------------------------------------------- SuperBill Details Patient Name: [name] [name] Date of Service: 05/05/2021 Medical Record Number: [id number] Patient Account Number: [id number] Date of Birth/Sex: Nov 26, 1918 (87 y.o. F) Treating RN: Primary Care Provider: Einar Crow Other Clinician: Referring Provider: Einar Crow Treating Provider/Extender: Altamese [place] in Treatment: 4 Diagnosis Coding ICD-10 Codes Code Description [phone] Pressure ulcer of right heel, stage 3 L89.623 Pressure ulcer of left heel, stage 3 Facility Procedures CPT4 Code: [id number] Description: 11042 - DEB SUBQ TISSUE 20 SQ CM/< Modifier: Quantity: 1 CPT4 Code: Description: ICD-10 Diagnosis Description L89.613 Pressure ulcer of right heel, stage 3 L89.623 Pressure ulcer of left heel, stage 3 Modifier: Quantity: Physician Procedures CPT4 Code: [id number] Description: 11042 - WC PHYS SUBQ TISS 20 SQ CM Modifier: Quantity: 1 CPT4 Code: Description: ICD-10 Diagnosis Description L89.613 Pressure ulcer of right heel, stage 3 L89.623 Pressure ulcer of left heel, stage 3 Modifier: Quantity: Electronic Signature(s) Signed: 05/06/2021 8:39:06 AM By: Baltazar Najjar MD Entered By: Baltazar Najjar on 05/05/2021 11:33:40

## 2021-05-06 NOTE — Progress Notes (Signed)
[name] [name], [name] [name] ([id number]) Visit Report for 05/05/2021 Arrival Information Details Patient Name: [name] [name], [name] [name] Date of Service: 05/05/2021 10:00 AM Medical Record Number: [id number] Patient Account Number: [id number] Date of Birth/Sex: [birth year] (87 y.o. F) Treating RN: Primary Care Stepahnie Campo: Einar Crow Other Clinician: Referring Larone Kliethermes: Einar Crow Treating Sye Schroepfer/Extender: Altamese [place] in Treatment: 4 Visit Information History Since Last Visit Added or deleted any medications: No Patient Arrived: Wheel Chair Had a fall or experienced change in No Arrival Time: 10:43 activities of daily living that may affect Accompanied By: self risk of falls: Transfer Assistance: Manual Hospitalized since last visit: No Patient Identification Verified: Yes Pain Present Now: No Secondary Verification Process Completed: Yes Patient Has Alerts: Yes Patient Alerts: NOT diabetic 2 person pivot Electronic Signature(s) Signed: 05/06/2021 4:37:07 PM By: Lolita Cram Entered By: Lolita Cram on 05/05/2021 10:47:27 [name] [name] ([id number]) -------------------------------------------------------------------------------- Lower Extremity Assessment Details Patient Name: [name] [name] Date of Service: 05/05/2021 10:00 AM Medical Record Number: [id number] Patient Account Number: [id number] Date of Birth/Sex: [birth year] (87 y.o. F) Treating RN: Primary Care [employer] Mcjunkins: Einar Crow Other Clinician: Referring Shawna Wearing: Einar Crow Treating Lamberto Dinapoli/Extender: Altamese [place] in Treatment: 4 Edema Assessment Assessed: [Left: Yes] [Right: Yes] [Left: Edema] [Right: :] Calf Left: Right: Point of Measurement: 40 cm From Medial Instep 26 cm 25.5 cm Ankle Left: Right: Point of Measurement: 10 cm From Medial Instep 22 cm 21 cm Vascular Assessment Pulses: Dorsalis Pedis Palpable: [Left:Yes] [Right:Yes] Electronic Signature(s) Signed: 05/06/2021  4:37:07 PM By: Lolita Cram Entered By: Lolita Cram on 05/05/2021 11:01:31 [name] [name] ([id number]) -------------------------------------------------------------------------------- Multi Wound Chart Details Patient Name: [name] [name] Date of Service: 05/05/2021 10:00 AM Medical Record Number: [id number] Patient Account Number: [id number] Date of Birth/Sex: January 05, 1918 (87 y.o. F) Treating RN: Huel Coventry Primary Care Austine Kelsay: Einar Crow Other Clinician: Referring Makhayla Mcmurry: Einar Crow Treating Husam Hohn/Extender: Altamese [place] in Treatment: 4 Vital Signs Height(in): 64 Pulse(bpm): 52 Weight(lbs): 100 Blood Pressure(mmHg): 121/53 Body Mass Index(BMI): 17 Temperature(F): 97.9 Respiratory Rate(breaths/min): 18 Photos: [N/A:N/A] Wound Location: Left Calcaneus Right Calcaneus N/A Wounding Event: Gradually Appeared Gradually Appeared N/A Primary Etiology: Pressure Ulcer Pressure Ulcer N/A Comorbid History: Chronic Obstructive Pulmonary Chronic Obstructive Pulmonary N/A Disease (COPD), Dementia Disease (COPD), Dementia Date Acquired: 01/12/2021 01/12/2021 N/A Weeks of Treatment: 4 4 N/A Wound Status: Open Open N/A Measurements L x W x D (cm) 1x0.6x0.6 0.5x0.3x0.4 N/A Area (cm) : 0.471 0.118 N/A Volume (cm) : 0.283 0.047 N/A % Reduction in Area: 37.50% 68.70% N/A % Reduction in Volume: -25.20% 58.40% N/A Classification: Category/Stage III Category/Stage III N/A Exudate Amount: Medium Medium N/A Exudate Type: Serous Serous N/A Exudate Color: amber amber N/A Granulation Amount: Large (67-100%) Medium (34-66%) N/A Granulation Quality: Red, Pink, Pale Red, Pink, Pale N/A Necrotic Amount: Small (1-33%) Medium (34-66%) N/A Exposed Structures: Fat Layer (Subcutaneous Tissue): Fat Layer (Subcutaneous Tissue): N/A Yes Yes Fascia: No Fascia: No Tendon: No Tendon: No Muscle: No Muscle: No Joint: No Joint: No Bone: No Bone: No Epithelialization:  None None N/A Debridement: N/A Debridement - Excisional N/A Pre-procedure Verification/Time N/A 11:08 N/A Out Taken: Tissue Debrided: N/A Subcutaneous, Slough N/A Level: N/A Skin/Subcutaneous Tissue N/A Debridement Area (sq cm): N/A 0.15 N/A Instrument: N/A Curette N/A Bleeding: N/A Moderate N/A Hemostasis Achieved: N/A Pressure N/A Debridement Treatment N/A Procedure was tolerated well N/A Response: Post Debridement N/A 0.5x0.3x0.5 N/A Measurements L x W x D (cm) Post Debridement Volume: N/A 0.059 N/A (cm) Lasure, Janeth ([id number]) Post Debridement Stage: N/A Category/Stage III N/A Procedures Performed: N/A  Debridement N/A Treatment Notes Wound #1 (Calcaneus) Wound Laterality: Left Cleanser Wound Cleanser Discharge Instruction: Wash your hands with soap and water. Remove old dressing, discard into plastic bag and place into trash. Cleanse the wound with Wound Cleanser prior to applying a clean dressing using gauze sponges, not tissues or cotton balls. Do not scrub or use excessive force. Pat dry using gauze sponges, not tissue or cotton balls. Peri-Wound Care Topical Primary Dressing Prisma 4.34 (in) Discharge Instruction: Moisten w/normal saline or sterile water; Cover wound as directed. Do not remove from wound bed. Secondary Dressing Mepilex Border Flex, 4x4 (in/in) Discharge Instruction: Apply to wound as directed. Do not cut. Secured With Compression Wrap Compression Stockings Add-Ons Wound #2 (Calcaneus) Wound Laterality: Right Cleanser Wound Cleanser Discharge Instruction: Wash your hands with soap and water. Remove old dressing, discard into plastic bag and place into trash. Cleanse the wound with Wound Cleanser prior to applying a clean dressing using gauze sponges, not tissues or cotton balls. Do not scrub or use excessive force. Pat dry using gauze sponges, not tissue or cotton balls. Peri-Wound Care Topical Primary Dressing Prisma 4.34 (in) Discharge  Instruction: Moisten w/normal saline or sterile water; Cover wound as directed. Do not remove from wound bed. Secondary Dressing Mepilex Border Flex, 4x4 (in/in) Discharge Instruction: Apply to wound as directed. Do not cut. Secured With Compression Wrap Compression Stockings Facilities managerAdd-Ons Electronic Signature(s) Signed: 05/06/2021 8:39:06 AM By: Baltazar Najjarobson, Michael MD Entered By: Baltazar Najjarobson, Michael on 05/05/2021 11:23:46 [name] [name], [name] [name] ([id number]) -------------------------------------------------------------------------------- Multi-Disciplinary Care Plan Details Patient Name: [name] [name], [name] [name] Date of Service: 05/05/2021 10:00 AM Medical Record Number: [id number] Patient Account Number: [id number] Date of Birth/Sex: 06/25/1918 (87 y.o. F) Treating RN: Huel CoventryWoody, Kim Primary Care Akshaj Besancon: Einar CrowAnderson, Marshall Other Clinician: Referring Dominic Rhome: Einar CrowAnderson, Marshall Treating Suhana Wilner/Extender: Altamese CarolinaOBSON, MICHAEL G Weeks in Treatment: 4 Active Inactive Necrotic Tissue Nursing Diagnoses: Impaired tissue integrity related to necrotic/devitalized tissue Goals: Necrotic/devitalized tissue will be minimized in the wound bed Date Initiated: 04/07/2021 Target Resolution Date: 04/15/2021 Goal Status: Active Interventions: Assess patient pain level pre-, during and post procedure and prior to discharge Treatment Activities: Apply topical anesthetic as ordered : 04/07/2021 Notes: Orientation to the Wound Care Program Nursing Diagnoses: Knowledge deficit related to the wound healing center program Goals: Patient/caregiver will verbalize understanding of the Wound Healing Center Program Date Initiated: 04/07/2021 Target Resolution Date: 04/15/2021 Goal Status: Active Interventions: Provide education on orientation to the wound center Notes: Pressure Nursing Diagnoses: Knowledge deficit related to management of pressures ulcers Goals: Patient will remain free from development of additional pressure ulcers Date  Initiated: 04/07/2021 Target Resolution Date: 04/07/2021 Goal Status: Active Patient will remain free of pressure ulcers Date Initiated: 04/07/2021 Target Resolution Date: 04/07/2021 Goal Status: Active Interventions: Provide education on pressure ulcers Notes: Wound/Skin Impairment Nursing Diagnoses: Impaired tissue integrity [name] [name], [name] [name] ([id number]) Goals: Ulcer/skin breakdown will have a volume reduction of 30% by week 4 Date Initiated: 04/07/2021 Target Resolution Date: 05/07/2021 Goal Status: Active Interventions: Assess ulceration(s) every visit Notes: Electronic Signature(s) Signed: 05/06/2021 9:33:55 AM By: Elliot GurneyWoody, BSN, RN, CWS, Kim RN, BSN Entered By: Elliot GurneyWoody, BSN, RN, CWS, Kim on 05/05/2021 11:08:35 [name] [name], Vannia ([id number]) -------------------------------------------------------------------------------- Pain Assessment Details Patient Name: [name] [name], [name] [name] Date of Service: 05/05/2021 10:00 AM Medical Record Number: [id number] Patient Account Number: [id number] Date of Birth/Sex: 02/29/1918 (87 y.o. F) Treating RN: Primary Care Tarnesha Ulloa: Einar CrowAnderson, Marshall Other Clinician: Referring Georgiana Spillane: Einar CrowAnderson, Marshall Treating Zayden Maffei/Extender: Altamese CarolinaOBSON, MICHAEL G Weeks in Treatment: 4 Active Problems Location of Pain Severity and Description of Pain  Patient Has Paino No Site Locations Rate the pain. Current Pain Level: 0 Pain Management and Medication Current Pain Management: Electronic Signature(s) Signed: 05/06/2021 4:37:07 PM By: Lolita Cram Entered By: Lolita Cram on 05/05/2021 10:48:51 [name] [name] ([id number]) -------------------------------------------------------------------------------- Patient/Caregiver Education Details Patient Name: [name] [name] Date of Service: 05/05/2021 10:00 AM Medical Record Number: [id number] Patient Account Number: [id number] Date of Birth/Gender: 02/07/1918 (87 y.o. F) Treating RN: Huel Coventry Primary Care Physician:  Einar Crow Other Clinician: Referring Physician: Einar Crow Treating Physician/Extender: Altamese [place] in Treatment: 4 Education Assessment Education Provided To: Patient Education Topics Provided Wound Debridement: Handouts: Wound Debridement Methods: Demonstration, Explain/Verbal Responses: State content correctly Wound/Skin Impairment: Handouts: Caring for Your Ulcer Methods: Demonstration, Explain/Verbal Responses: State content correctly Electronic Signature(s) Signed: 05/06/2021 9:33:55 AM By: Elliot Gurney, BSN, RN, CWS, Kim RN, BSN Entered By: Elliot Gurney, BSN, RN, CWS, Kim on 05/05/2021 11:10:39 [name] [name] ([id number]) -------------------------------------------------------------------------------- Wound Assessment Details Patient Name: [name] [name] Date of Service: 05/05/2021 10:00 AM Medical Record Number: [id number] Patient Account Number: [id number] Date of Birth/Sex: 06/30/1918 (87 y.o. F) Treating RN: Primary Care Ethen Bannan: Einar Crow Other Clinician: Referring Tyjay Galindo: Einar Crow Treating Maleta Pacha/Extender: Altamese [place] in Treatment: 4 Wound Status Wound Number: 1 Primary Etiology: Pressure Ulcer Wound Location: Left Calcaneus Wound Status: Open Wounding Event: Gradually Appeared Comorbid Chronic Obstructive Pulmonary Disease (COPD), History: Dementia Date Acquired: 01/12/2021 Weeks Of Treatment: 4 Clustered Wound: No Photos Wound Measurements Length: (cm) 1 Width: (cm) 0.6 Depth: (cm) 0.6 Area: (cm) 0.471 Volume: (cm) 0.283 % Reduction in Area: 37.5% % Reduction in Volume: -25.2% Epithelialization: None Tunneling: No Undermining: No Wound Description Classification: Category/Stage III Exudate Amount: Medium Exudate Type: Serous Exudate Color: amber Foul Odor After Cleansing: No Slough/Fibrino Yes Wound Bed Granulation Amount: Large (67-100%) Exposed Structure Granulation Quality: Red, Pink,  Pale Fascia Exposed: No Necrotic Amount: Small (1-33%) Fat Layer (Subcutaneous Tissue) Exposed: Yes Necrotic Quality: Adherent Slough Tendon Exposed: No Muscle Exposed: No Joint Exposed: No Bone Exposed: No Treatment Notes Wound #1 (Calcaneus) Wound Laterality: Left Cleanser Wound Cleanser Discharge Instruction: Wash your hands with soap and water. Remove old dressing, discard into plastic bag and place into trash. Cleanse the wound with Wound Cleanser prior to applying a clean dressing using gauze sponges, not tissues or cotton balls. Do not scrub or use excessive force. Pat dry using gauze sponges, not tissue or cotton balls. [name] [name], [name] [name] ([id number]) Peri-Wound Care Topical Primary Dressing Prisma 4.34 (in) Discharge Instruction: Moisten w/normal saline or sterile water; Cover wound as directed. Do not remove from wound bed. Secondary Dressing Mepilex Border Flex, 4x4 (in/in) Discharge Instruction: Apply to wound as directed. Do not cut. Secured With Compression Wrap Compression Stockings Add-Ons Electronic Signature(s) Signed: 05/06/2021 4:37:07 PM By: Lolita Cram Entered By: Lolita Cram on 05/05/2021 10:59:13 [name] [name] ([id number]) -------------------------------------------------------------------------------- Wound Assessment Details Patient Name: [name] [name] Date of Service: 05/05/2021 10:00 AM Medical Record Number: [id number] Patient Account Number: [id number] Date of Birth/Sex: 01/31/1918 (87 y.o. F) Treating RN: Primary Care Jaymin Waln: Einar Crow Other Clinician: Referring Brynn Mulgrew: Einar Crow Treating Chamaine Stankus/Extender: Altamese [place] in Treatment: 4 Wound Status Wound Number: 2 Primary Etiology: Pressure Ulcer Wound Location: Right Calcaneus Wound Status: Open Wounding Event: Gradually Appeared Comorbid Chronic Obstructive Pulmonary Disease (COPD), History: Dementia Date Acquired: 01/12/2021 Weeks Of Treatment:  4 Clustered Wound: No Photos Wound Measurements Length: (cm) 0.5 Width: (cm) 0.3 Depth: (cm) 0.4 Area: (cm) 0.118 Volume: (cm) 0.047 % Reduction in Area: 68.7% % Reduction in Volume: 58.4%  Epithelialization: None Tunneling: No Undermining: No Wound Description Classification: Category/Stage III Exudate Amount: Medium Exudate Type: Serous Exudate Color: amber Foul Odor After Cleansing: No Slough/Fibrino Yes Wound Bed Granulation Amount: Medium (34-66%) Exposed Structure Granulation Quality: Red, Pink, Pale Fascia Exposed: No Necrotic Amount: Medium (34-66%) Fat Layer (Subcutaneous Tissue) Exposed: Yes Necrotic Quality: Adherent Slough Tendon Exposed: No Muscle Exposed: No Joint Exposed: No Bone Exposed: No Treatment Notes Wound #2 (Calcaneus) Wound Laterality: Right Cleanser Wound Cleanser Discharge Instruction: Wash your hands with soap and water. Remove old dressing, discard into plastic bag and place into trash. Cleanse the wound with Wound Cleanser prior to applying a clean dressing using gauze sponges, not tissues or cotton balls. Do not scrub or use excessive force. Pat dry using gauze sponges, not tissue or cotton balls. [name] [name], [name] [name] ([id number]) Peri-Wound Care Topical Primary Dressing Prisma 4.34 (in) Discharge Instruction: Moisten w/normal saline or sterile water; Cover wound as directed. Do not remove from wound bed. Secondary Dressing Mepilex Border Flex, 4x4 (in/in) Discharge Instruction: Apply to wound as directed. Do not cut. Secured With Compression Wrap Compression Stockings Add-Ons Electronic Signature(s) Signed: 05/06/2021 4:37:07 PM By: Lolita Cram Entered By: Lolita Cram on 05/05/2021 10:59:44 [name] [name] ([id number]) -------------------------------------------------------------------------------- Vitals Details Patient Name: [name] [name] Date of Service: 05/05/2021 10:00 AM Medical Record Number: [id number] Patient  Account Number: [id number] Date of Birth/Sex: [birth year] (87 y.o. F) Treating RN: Primary Care Zyier Dykema: Einar Crow Other Clinician: Referring Laykin Rainone: Einar Crow Treating Lynton Crescenzo/Extender: Altamese [place] in Treatment: 4 Vital Signs Time Taken: 10:43 Temperature (F): 97.9 Height (in): 64 Pulse (bpm): 52 Weight (lbs): 100 Respiratory Rate (breaths/min): 18 Body Mass Index (BMI): 17.2 Blood Pressure (mmHg): 121/53 Reference Range: 80 - 120 mg / dl Electronic Signature(s) Signed: 05/06/2021 4:37:07 PM By: Lolita Cram Entered By: Lolita Cram on 05/05/2021 10:48:46

## 2021-05-19 ENCOUNTER — Encounter: Payer: Medicare Other | Attending: Internal Medicine | Admitting: Internal Medicine

## 2021-05-19 ENCOUNTER — Other Ambulatory Visit: Payer: Self-pay

## 2021-05-19 DIAGNOSIS — L89623 Pressure ulcer of left heel, stage 3: Secondary | ICD-10-CM | POA: Insufficient documentation

## 2021-05-19 DIAGNOSIS — F0280 Dementia in other diseases classified elsewhere without behavioral disturbance: Secondary | ICD-10-CM | POA: Insufficient documentation

## 2021-05-19 DIAGNOSIS — J449 Chronic obstructive pulmonary disease, unspecified: Secondary | ICD-10-CM | POA: Insufficient documentation

## 2021-05-19 DIAGNOSIS — L89613 Pressure ulcer of right heel, stage 3: Secondary | ICD-10-CM | POA: Diagnosis not present

## 2021-05-19 DIAGNOSIS — Z9981 Dependence on supplemental oxygen: Secondary | ICD-10-CM | POA: Diagnosis not present

## 2021-05-19 DIAGNOSIS — G309 Alzheimer's disease, unspecified: Secondary | ICD-10-CM | POA: Diagnosis not present

## 2021-05-19 NOTE — Progress Notes (Signed)
AYSLIN, KUNDERT ([id number]) Visit Report for 05/19/2021 Arrival Information Details Patient Name: [name] [name], [name] [name] Date of Service: 05/19/2021 10:15 AM Medical Record Number: [id number] Patient Account Number: [id number] Date of Birth/Sex: 30-Oct-1918 (87 y.o. F) Treating RN: Hansel Feinstein Primary Care Shewanda Sharpe: Einar Crow Other Clinician: Referring Meliya Mcconahy: Einar Crow Treating Buffy Ehler/Extender: Altamese [place] in Treatment: 6 Visit Information History Since Last Visit Added or deleted any medications: No Patient Arrived: Wheel Chair Had a fall or experienced change in No Arrival Time: 10:26 activities of daily living that may affect Accompanied By: POA risk of falls: Transfer Assistance: Manual Hospitalized since last visit: No Patient Identification Verified: Yes Has Dressing in Place as Prescribed: Yes Secondary Verification Process Completed: Yes Pain Present Now: Yes Patient Has Alerts: Yes Patient Alerts: NOT diabetic 2 person pivot Electronic Signature(s) Signed: 05/19/2021 2:11:15 PM By: Hansel Feinstein Entered By: Hansel Feinstein on 05/19/2021 10:27:20 [name] [name] ([id number]) -------------------------------------------------------------------------------- Clinic Level of Care Assessment Details Patient Name: [name] [name] Date of Service: 05/19/2021 10:15 AM Medical Record Number: [id number] Patient Account Number: [id number] Date of Birth/Sex: 09-Nov-1918 (87 y.o. F) Treating RN: Rogers Blocker Primary Care Anaijah Augsburger: Einar Crow Other Clinician: Referring Makhai Fulco: Einar Crow Treating Dashiell Franchino/Extender: Altamese [place] in Treatment: 6 Clinic Level of Care Assessment Items TOOL 1 Quantity Score []  - Use when EandM and Procedure is performed on INITIAL visit 0 ASSESSMENTS - Nursing Assessment / Reassessment []  - General Physical Exam (combine w/ comprehensive assessment (listed just below) when performed on new 0 pt. evals) []  -  0 Comprehensive Assessment (HX, ROS, Risk Assessments, Wounds Hx, etc.) ASSESSMENTS - Wound and Skin Assessment / Reassessment []  - Dermatologic / Skin Assessment (not related to wound area) 0 ASSESSMENTS - Ostomy and/or Continence Assessment and Care []  - Incontinence Assessment and Management 0 []  - 0 Ostomy Care Assessment and Management (repouching, etc.) PROCESS - Coordination of Care []  - Simple Patient / Family Education for ongoing care 0 []  - 0 Complex (extensive) Patient / Family Education for ongoing care []  - 0 Staff obtains , Records, Test Results / Process Orders []  - 0 Staff telephones HHA, Nursing Homes / Clarify orders / etc []  - 0 Routine Transfer to another Facility (non-emergent condition) []  - 0 Routine Hospital Admission (non-emergent condition) []  - 0 New Admissions / / Ordering NPWT, Apligraf, etc. []  - 0 Emergency Hospital Admission (emergent condition) PROCESS - Special Needs []  - Pediatric / Minor Patient Management 0 []  - 0 Isolation Patient Management []  - 0 Hearing / Language / Visual special needs []  - 0 Assessment of Community assistance (transportation, D/C planning, etc.) []  - 0 Additional assistance / Altered mentation []  - 0 Support Surface(s) Assessment (bed, cushion, seat, etc.) INTERVENTIONS - Miscellaneous []  - External ear exam 0 []  - 0 Patient Transfer (multiple staff / / Similar devices) []  - 0 Simple Staple / Suture removal (25 or less) []  - 0 Complex Staple / Suture removal (26 or more) []  - 0 Hypo/Hyperglycemic Management (do not check if billed separately) []  - 0 Ankle / Brachial Index (ABI) - do not check if billed separately Has the patient been seen at the hospital within the last three years: Yes Total Score: 0 Level Of Care: ____ ( ) Electronic Signature(s) Signed: 05/19/2021 1:03:43 PM By: , RN Entered By: ,  Chiropractor on 05/19/2021 10:50:42 ( ) -------------------------------------------------------------------------------- Encounter Discharge Information Details Patient Name: Date of Service: 05/19/2021 10:15 AM  Medical Record Number: [id number] Patient Account Number: [id number] Date of Birth/Sex: Aug 03, 1918 (87 y.o. F) Treating RN: Rogers Blocker Primary Care Geovanny Sartin: Einar Crow Other Clinician: Referring Chigozie Basaldua: Einar Crow Treating Azzam Mehra/Extender: Altamese [place] in Treatment: 6 Encounter Discharge Information Items Post Procedure Vitals Discharge Condition: Stable Temperature (F): 96.9 Ambulatory Status: Wheelchair Pulse (bpm): 96 Discharge Destination: Skilled Nursing Facility Respiratory Rate (breaths/min): 20 Orders Sent: Yes Blood Pressure (mmHg): 119/78 Transportation: Private Auto Accompanied By: Melina Modena Schedule Follow-up Appointment: Yes Clinical Summary of Care: Electronic Signature(s) Signed: 05/19/2021 1:03:43 PM By: Phillis Haggis, Dondra Prader RN Entered By: Phillis Haggis, Dondra Prader on 05/19/2021 10:57:44 [name] [name] ([id number]) -------------------------------------------------------------------------------- Lower Extremity Assessment Details Patient Name: [name] [name] Date of Service: 05/19/2021 10:15 AM Medical Record Number: [id number] Patient Account Number: [id number] Date of Birth/Sex: Oct 13, 1918 (87 y.o. F) Treating RN: Hansel Feinstein Primary Care Jenavieve Freda: Einar Crow Other Clinician: Referring Irini Leet: Einar Crow Treating Edin Kon/Extender: Maxwell Caul Weeks in Treatment: 6 Edema Assessment Assessed: [Left: Yes] [Right: Yes] [Left: Edema] [Right: :] Vascular Assessment Pulses: Dorsalis Pedis Palpable: [Left:Yes] [Right:Yes] Electronic Signature(s) Signed: 05/19/2021 2:11:15 PM By: Hansel Feinstein Entered By: Hansel Feinstein on 05/19/2021 10:36:49 [name] [name]  ([id number]) -------------------------------------------------------------------------------- Multi Wound Chart Details Patient Name: [name] [name] Date of Service: 05/19/2021 10:15 AM Medical Record Number: [id number] Patient Account Number: [id number] Date of Birth/Sex: Feb 23, 1918 (87 y.o. F) Treating RN: Rogers Blocker Primary Care Demonta Wombles: Einar Crow Other Clinician: Referring Alijah Akram: Einar Crow Treating Ebin Palazzi/Extender: Altamese [place] in Treatment: 6 Vital Signs Height(in): 64 Pulse(bpm): 96 Weight(lbs): 100 Blood Pressure(mmHg): 119/78 Body Mass Index(BMI): 17 Temperature(F): 96.9 Respiratory Rate(breaths/min): 20 Photos: [N/A:N/A] Wound Location: Left Calcaneus Right Calcaneus N/A Wounding Event: Gradually Appeared Gradually Appeared N/A Primary Etiology: Pressure Ulcer Pressure Ulcer N/A Comorbid History: Chronic Obstructive Pulmonary Chronic Obstructive Pulmonary N/A Disease (COPD), Dementia Disease (COPD), Dementia Date Acquired: 01/12/2021 01/12/2021 N/A Weeks of Treatment: 6 6 N/A Wound Status: Open Open N/A Measurements L x W x D (cm) 0.5x0.3x0.4 0.2x0.2x0.1 N/A Area (cm) : 0.118 0.031 N/A Volume (cm) : 0.047 0.003 N/A % Reduction in Area: 84.40% 91.80% N/A % Reduction in Volume: 79.20% 97.30% N/A Classification: Category/Stage III Category/Stage III N/A Exudate Amount: Medium None Present N/A Exudate Type: Serous N/A N/A Exudate Color: amber N/A N/A Granulation Amount: Large (67-100%) None Present (0%) N/A Granulation Quality: Red, Pink, Pale N/A N/A Necrotic Amount: Small (1-33%) Large (67-100%) N/A Necrotic Tissue: Adherent Slough Eschar N/A Exposed Structures: Fat Layer (Subcutaneous Tissue): Fat Layer (Subcutaneous Tissue): N/A Yes Yes Fascia: No Fascia: No Tendon: No Tendon: No Muscle: No Muscle: No Joint: No Joint: No Bone: No Bone: No Epithelialization: None Small (1-33%) N/A Debridement: Debridement -  Selective/Open Debridement - Selective/Open N/A Wound Wound Pre-procedure Verification/Time 10:40 10:40 N/A Out Taken: Tissue Debrided: Callus Callus N/A Level: Skin/Epidermis Skin/Epidermis N/A Debridement Area (sq cm): 0.15 0.04 N/A Instrument: Curette Curette N/A Bleeding: Minimum Minimum N/A Hemostasis Achieved: Pressure Pressure N/A Debridement Treatment Procedure was tolerated well Procedure was tolerated well N/A Response: Post Debridement 0.5x0.3x0.3 0.2x0.2x0.1 N/A Measurements L x W x D (cm) [name] [name], [name] [name] ([id number]) Post Debridement Volume: 0.035 0.003 N/A (cm) Post Debridement Stage: Category/Stage III Category/Stage III N/A Procedures Performed: Debridement Debridement N/A Treatment Notes Electronic Signature(s) Signed: 05/19/2021 4:14:58 PM By: Baltazar Najjar MD Entered By: Baltazar Najjar on 05/19/2021 10:49:46 [name] [name] ([id number]) -------------------------------------------------------------------------------- Multi-Disciplinary Care Plan Details Patient Name: [name] [name] Date of Service: 05/19/2021 10:15 AM Medical Record Number: [id number] Patient Account Number: [id number] Date of Birth/Sex: 1918-11-04 (87 y.o.  F) Treating RN: Rogers Blocker Primary Care Chelisa Hennen: Einar Crow Other Clinician: Referring Kassity Woodson: Einar Crow Treating Tannar Broker/Extender: Altamese [place] in Treatment: 6 Active Inactive Necrotic Tissue Nursing Diagnoses: Impaired tissue integrity related to necrotic/devitalized tissue Goals: Necrotic/devitalized tissue will be minimized in the wound bed Date Initiated: 04/07/2021 Target Resolution Date: 04/15/2021 Goal Status: Active Interventions: Assess patient pain level pre-, during and post procedure and prior to discharge Treatment Activities: Apply topical anesthetic as ordered : 04/07/2021 Notes: Pressure Nursing Diagnoses: Knowledge deficit related to management of pressures ulcers Goals: Patient  will remain free from development of additional pressure ulcers Date Initiated: 04/07/2021 Target Resolution Date: 04/07/2021 Goal Status: Active Patient will remain free of pressure ulcers Date Initiated: 04/07/2021 Target Resolution Date: 04/07/2021 Goal Status: Active Interventions: Provide education on pressure ulcers Notes: Wound/Skin Impairment Nursing Diagnoses: Impaired tissue integrity Goals: Ulcer/skin breakdown will have a volume reduction of 30% by week 4 Date Initiated: 04/07/2021 Target Resolution Date: 05/07/2021 Goal Status: Active Interventions: Assess ulceration(s) every visit Notes: Electronic Signature(s) Signed: 05/19/2021 1:03:43 PM By: Phillis Haggis, Dondra Prader RN Entered By: Phillis Haggis, Dondra Prader on 05/19/2021 10:39:08 [name] [name] ([id number]) Dan Humphreys, Dorene Grebe ([id number]) -------------------------------------------------------------------------------- Pain Assessment Details Patient Name: [name] [name] Date of Service: 05/19/2021 10:15 AM Medical Record Number: [id number] Patient Account Number: [id number] Date of Birth/Sex: 1918/02/02 (87 y.o. F) Treating RN: Hansel Feinstein Primary Care Deshana Rominger: Einar Crow Other Clinician: Referring Miyah Hampshire: Einar Crow Treating Kelvon Giannini/Extender: Altamese [place] in Treatment: 6 Active Problems Location of Pain Severity and Description of Pain Patient Has Paino Yes Site Locations Pain Location: Generalized Pain Rate the pain. Current Pain Level: 2 Pain Management and Medication Current Pain Management: Notes knee pain Electronic Signature(s) Signed: 05/19/2021 2:11:15 PM By: Hansel Feinstein Entered By: Hansel Feinstein on 05/19/2021 10:30:09 [name] [name] ([id number]) -------------------------------------------------------------------------------- Patient/Caregiver Education Details Patient Name: [name] [name] Date of Service: 05/19/2021 10:15 AM Medical Record Number: [id number] Patient Account  Number: [id number] Date of Birth/Gender: September 27, 1918 (87 y.o. F) Treating RN: Rogers Blocker Primary Care Physician: Einar Crow Other Clinician: Referring Physician: Einar Crow Treating Physician/Extender: Altamese [place] in Treatment: 6 Education Assessment Education Provided To: Caregiver Education Topics Provided Wound Debridement: Methods: Explain/Verbal Responses: State content correctly Electronic Signature(s) Signed: 05/19/2021 1:03:43 PM By: Phillis Haggis, Dondra Prader RN Entered By: Phillis Haggis, Dondra Prader on 05/19/2021 10:50:57 [name] [name] ([id number]) -------------------------------------------------------------------------------- Wound Assessment Details Patient Name: [name] [name] Date of Service: 05/19/2021 10:15 AM Medical Record Number: [id number] Patient Account Number: [id number] Date of Birth/Sex: Dec 27, 1917 (87 y.o. F) Treating RN: Hansel Feinstein Primary Care Shishir Krantz: Einar Crow Other Clinician: Referring Ervie Mccard: Einar Crow Treating Ashden Sonnenberg/Extender: Altamese [place] in Treatment: 6 Wound Status Wound Number: 1 Primary Etiology: Pressure Ulcer Wound Location: Left Calcaneus Wound Status: Open Wounding Event: Gradually Appeared Comorbid Chronic Obstructive Pulmonary Disease (COPD), History: Dementia Date Acquired: 01/12/2021 Weeks Of Treatment: 6 Clustered Wound: No Photos Wound Measurements Length: (cm) 0.5 Width: (cm) 0.3 Depth: (cm) 0.4 Area: (cm) 0.118 Volume: (cm) 0.047 % Reduction in Area: 84.4% % Reduction in Volume: 79.2% Epithelialization: None Tunneling: No Undermining: No Wound Description Classification: Category/Stage III Exudate Amount: Medium Exudate Type: Serous Exudate Color: amber Foul Odor After Cleansing: No Slough/Fibrino Yes Wound Bed Granulation Amount: Large (67-100%) Exposed Structure Granulation Quality: Red, Pink, Pale Fascia Exposed: No Necrotic Amount: Small (1-33%) Fat  Layer (Subcutaneous Tissue) Exposed: Yes Necrotic Quality: Adherent Slough Tendon Exposed: No Muscle Exposed: No Joint Exposed: No Bone Exposed: No Treatment Notes Wound #1 (Calcaneus) Wound Laterality: Left Cleanser  Wound Cleanser Discharge Instruction: Wash your hands with soap and water. Remove old dressing, discard into plastic bag and place into trash. Cleanse the wound with Wound Cleanser prior to applying a clean dressing using gauze sponges, not tissues or cotton balls. Do not scrub or use excessive force. Pat dry using gauze sponges, not tissue or cotton balls. [name] RowerWALKER, [name] [name] ([id number]) Peri-Wound Care Topical Primary Dressing Prisma 4.34 (in) Discharge Instruction: Moisten w/normal saline or sterile water; Cover wound as directed. Do not remove from wound bed. Secondary Dressing Mepilex Border Flex, 4x4 (in/in) Discharge Instruction: Apply to wound as directed. Do not cut. Secured With Compression Wrap Compression Stockings Add-Ons Electronic Signature(s) Signed: 05/19/2021 2:11:15 PM By: Hansel FeinsteinBishop, Joy Entered By: Hansel FeinsteinBishop, Joy on 05/19/2021 10:35:27 [name] RowerWALKER, [name] [name] ([id number]) -------------------------------------------------------------------------------- Wound Assessment Details Patient Name: [name] RowerWALKER, [name] [name] Date of Service: 05/19/2021 10:15 AM Medical Record Number: [id number] Patient Account Number: [id number] Date of Birth/Sex: 02/12/1918 (87 y.o. F) Treating RN: Hansel FeinsteinBishop, Joy Primary Care Spencer Peterkin: Einar CrowAnderson, Marshall Other Clinician: Referring Sandhya Denherder: Einar CrowAnderson, Marshall Treating Jrue Yambao/Extender: Altamese CarolinaOBSON, MICHAEL G Weeks in Treatment: 6 Wound Status Wound Number: 2 Primary Etiology: Pressure Ulcer Wound Location: Right Calcaneus Wound Status: Open Wounding Event: Gradually Appeared Comorbid Chronic Obstructive Pulmonary Disease (COPD), History: Dementia Date Acquired: 01/12/2021 Weeks Of Treatment: 6 Clustered Wound: No Photos Wound Measurements Length:  (cm) 0.2 % Reduc Width: (cm) 0.2 % Reduc Depth: (cm) 0.1 Epithel Area: (cm) 0.031 Tunnel Volume: (cm) 0.003 Underm tion in Area: 91.8% tion in Volume: 97.3% ialization: Small (1-33%) ing: No ining: No Wound Description Classification: Category/Stage III Foul O Exudate Amount: None Present Slough dor After Cleansing: No /Fibrino Yes Wound Bed Granulation Amount: None Present (0%) Exposed Structure Necrotic Amount: Large (67-100%) Fascia Exposed: No Necrotic Quality: Eschar Fat Layer (Subcutaneous Tissue) Exposed: Yes Tendon Exposed: No Muscle Exposed: No Joint Exposed: No Bone Exposed: No Treatment Notes Wound #2 (Calcaneus) Wound Laterality: Right Cleanser Peri-Wound Care Topical Primary Dressing [name] RowerWALKER, [name] [name] ([id number]) Secondary Dressing Foam Dressing, 4x4 (in/in) Discharge Instruction: Foam dressing for protection, heel cup applied in office Secured With Compression Wrap Compression Stockings Add-Ons Electronic Signature(s) Signed: 05/19/2021 2:11:15 PM By: Hansel FeinsteinBishop, Joy Entered By: Hansel FeinsteinBishop, Joy on 05/19/2021 10:36:18 [name] RowerWALKER, [name] [name] ([id number]) -------------------------------------------------------------------------------- Vitals Details Patient Name: [name] RowerWALKER, [name] [name] Date of Service: 05/19/2021 10:15 AM Medical Record Number: [id number] Patient Account Number: [id number] Date of Birth/Sex: 03/12/1918 (87 y.o. F) Treating RN: Hansel FeinsteinBishop, Joy Primary Care Viera Okonski: Einar CrowAnderson, Marshall Other Clinician: Referring Dewie Ahart: Einar CrowAnderson, Marshall Treating Rainy Rothman/Extender: Altamese CarolinaOBSON, MICHAEL G Weeks in Treatment: 6 Vital Signs Time Taken: 10:25 Temperature (F): 96.9 Height (in): 64 Pulse (bpm): 96 Weight (lbs): 100 Respiratory Rate (breaths/min): 20 Body Mass Index (BMI): 17.2 Blood Pressure (mmHg): 119/78 Reference Range: 80 - 120 mg / dl Electronic Signature(s) Signed: 05/19/2021 2:11:15 PM By: Hansel FeinsteinBishop, Joy Entered ByHansel Feinstein: Bishop, Joy on 05/19/2021 10:29:36

## 2021-05-20 NOTE — Progress Notes (Signed)
MEHAR, SAGEN ([id number]) Visit Report for 05/19/2021 Debridement Details Patient Name: [name] [name], [name] [name] Date of Service: 05/19/2021 10:15 AM Medical Record Number: [id number] Patient Account Number: [id number] Date of Birth/Sex: 11/19/1918 (87 y.o. F) Treating RN: Huel Coventry Primary Care Provider: Einar Crow Other Clinician: Referring Provider: Einar Crow Treating Provider/Extender: Altamese [place] in Treatment: 6 Debridement Performed for Wound #1 Left Calcaneus Assessment: Performed By: Physician Maxwell Caul, MD Debridement Type: Debridement Level of Consciousness (Pre- Awake and Alert procedure): Pre-procedure Verification/Time Out Yes - 10:40 Taken: Start Time: 10:40 Total Area Debrided (L x W): 0.5 (cm) x 0.3 (cm) = 0.15 (cm) Tissue and other material Non-Viable, Callus, Skin: Epidermis debrided: Level: Skin/Epidermis Debridement Description: Selective/Open Wound Instrument: Curette Bleeding: Minimum Hemostasis Achieved: Pressure Response to Treatment: Procedure was tolerated well Level of Consciousness (Post- Awake and Alert procedure): Post Debridement Measurements of Total Wound Length: (cm) 0.5 Stage: Category/Stage III Width: (cm) 0.3 Depth: (cm) 0.3 Volume: (cm) 0.035 Character of Wound/Ulcer Post Debridement: Stable Post Procedure Diagnosis Same as Pre-procedure Electronic Signature(s) Signed: 05/19/2021 4:14:58 PM By: Baltazar Najjar MD Signed: 05/19/2021 4:36:28 PM By: Elliot Gurney, BSN, RN, CWS, Kim RN, BSN Entered By: Baltazar Najjar on 05/19/2021 10:49:58 [name] [name] ([id number]) -------------------------------------------------------------------------------- Debridement Details Patient Name: [name] [name] Date of Service: 05/19/2021 10:15 AM Medical Record Number: [id number] Patient Account Number: [id number] Date of Birth/Sex: 1918/08/07 (87 y.o. F) Treating RN: Huel Coventry Primary Care Provider: Einar Crow Other  Clinician: Referring Provider: Einar Crow Treating Provider/Extender: Altamese [place] in Treatment: 6 Debridement Performed for Wound #2 Right Calcaneus Assessment: Performed By: Physician Maxwell Caul, MD Debridement Type: Debridement Level of Consciousness (Pre- Awake and Alert procedure): Pre-procedure Verification/Time Out Yes - 10:40 Taken: Start Time: 10:40 Total Area Debrided (L x W): 0.2 (cm) x 0.2 (cm) = 0.04 (cm) Tissue and other material Non-Viable, Callus, Skin: Epidermis debrided: Level: Skin/Epidermis Debridement Description: Selective/Open Wound Instrument: Curette Bleeding: Minimum Hemostasis Achieved: Pressure Response to Treatment: Procedure was tolerated well Level of Consciousness (Post- Awake and Alert procedure): Post Debridement Measurements of Total Wound Length: (cm) 0.2 Stage: Category/Stage III Width: (cm) 0.2 Depth: (cm) 0.1 Volume: (cm) 0.003 Character of Wound/Ulcer Post Debridement: Stable Post Procedure Diagnosis Same as Pre-procedure Electronic Signature(s) Signed: 05/19/2021 4:14:58 PM By: Baltazar Najjar MD Signed: 05/19/2021 4:36:28 PM By: Elliot Gurney, BSN, RN, CWS, Kim RN, BSN Entered By: Baltazar Najjar on 05/19/2021 10:50:10 [name] [name] ([id number]) -------------------------------------------------------------------------------- HPI Details Patient Name: [name] [name] Date of Service: 05/19/2021 10:15 AM Medical Record Number: [id number] Patient Account Number: [id number] Date of Birth/Sex: [birth year] (87 y.o. F) Treating RN: Huel Coventry Primary Care Provider: Einar Crow Other Clinician: Referring Provider: Einar Crow Treating Provider/Extender: Altamese [place] in Treatment: 6 History of Present Illness HPI Description: ADMISSION 04/07/2021 This is a 87 year old woman who is currently a resident at [facility] skilled facility. She is here with her niece who is her primary  family member. Prior to December she also was her caregiver. Apparently she was hospitalized from 11/06/2020 through 11/13/2020 with a right femoral neck fracture requiring a hip replacement. She was sent to [facility] for rehabilitation. She developed bilateral heel pressure ulcers over the tip of her heels. She is nonambulatory. Her niece is uncertain about the degree of pressure relief. However they have been using Santyl as the primary dressing for about 2 months. Past medical history includes COPD now O2 dependent, dementia, hypothyroidism, Alzheimer's disease, history of protein calorie malnutrition although her albumin while in the hospital  was 3.4, she has essential tremor although she is on Sinemet and has been for many years, hypothyroidism. She is nonambulatory. ABIs in our clinic were 1.45 on the right and 1.36 on the left 5/11; patient with bilateral pressure ulcers on the tips of her heels. We have been using silver collagen. She is at the facility. In spite of concerns that her dressings do not get changed on schedule and she is not wearing her boots at night, the surface area is actually smaller. 5/25; 2-week follow-up. The right heel continues to do well and is closing in. The left had subcutaneous debris on the surface and although it measures smaller is not anywhere close to healing. 6/8; 2-week follow-up. Both heels continue to better in fact the right heel is for all intents and purposes closed. We have been using silver collagen. She is at [facility] and since the last time we saw her she is now 63!! Electronic Signature(s) Signed: 05/19/2021 4:14:58 PM By: Baltazar Najjar MD Entered By: Baltazar Najjar on 05/19/2021 10:51:05 [name] [name] ([id number]) -------------------------------------------------------------------------------- Physical Exam Details Patient Name: [name] [name] Date of Service: 05/19/2021 10:15 AM Medical Record Number: [id number] Patient  Account Number: [id number] Date of Birth/Sex: [birth year] (87 y.o. F) Treating RN: Huel Coventry Primary Care Provider: Einar Crow Other Clinician: Referring Provider: Einar Crow Treating Provider/Extender: Altamese [place] in Treatment: 6 Constitutional Sitting or standing Blood Pressure is within target range for patient.. Pulse regular and within target range for patient.Marland Kitchen Respirations regular, non- labored and within target range.. Temperature is normal and within the target range for the patient.Marland Kitchen appears in no distress. Notes Wound exam; both wounds on the tip of her heels. I removed some callus and dry skin from the area that I was expecting to be closed on the right there is still a small open area here but very close to closed. The area on the left has a small open area remaining I also took callus from around the wound circumference and some dry skin as well. Minimal bleeding Electronic Signature(s) Signed: 05/19/2021 4:14:58 PM By: Baltazar Najjar MD Entered By: Baltazar Najjar on 05/19/2021 10:52:56 [name] [name] ([id number]) -------------------------------------------------------------------------------- Physician Orders Details Patient Name: [name] [name] Date of Service: 05/19/2021 10:15 AM Medical Record Number: [id number] Patient Account Number: [id number] Date of Birth/Sex: [birth year] (87 y.o. F) Treating RN: Rogers Blocker Primary Care Provider: Einar Crow Other Clinician: Referring Provider: Einar Crow Treating Provider/Extender: Altamese [place] in Treatment: 6 Verbal / Phone Orders: No Diagnosis Coding Follow-up Appointments o Return Appointment in 2 weeks. Bathing/ Shower/ Hygiene o May shower; gently cleanse wound with antibacterial soap, rinse and pat dry prior to dressing wounds Off-Loading o Turn and reposition every 2 hours - Keep off-loading (ie: sage boots) booties on feet while in the bed, float heels. Float  heels when sitting in wheelchair. Wound Treatment Wound #1 - Calcaneus Wound Laterality: Left Cleanser: Wound Cleanser 3 x Per Week/30 Days Discharge Instructions: Wash your hands with soap and water. Remove old dressing, discard into plastic bag and place into trash. Cleanse the wound with Wound Cleanser prior to applying a clean dressing using gauze sponges, not tissues or cotton balls. Do not scrub or use excessive force. Pat dry using gauze sponges, not tissue or cotton balls. Primary Dressing: Prisma 4.34 (in) 3 x Per Week/30 Days Discharge Instructions: Moisten w/normal saline or sterile water; Cover wound as directed. Do not remove from wound bed. Secondary Dressing: Mepilex Border Flex, 4x4 (in/in) 3 x  Per Week/30 Days Discharge Instructions: Apply to wound as directed. Do not cut. Wound #2 - Calcaneus Wound Laterality: Right Secondary Dressing: Foam Dressing, 4x4 (in/in) 1 x Per Day/30 Days Discharge Instructions: Foam dressing for protection, heel cup applied in office Electronic Signature(s) Signed: 05/19/2021 1:03:43 PM By: Phillis Haggis, Dondra Prader RN Signed: 05/19/2021 4:14:58 PM By: Baltazar Najjar MD Entered By: Phillis Haggis, Dondra Prader on 05/19/2021 10:51:56 [name] [name] ([id number]) -------------------------------------------------------------------------------- Problem List Details Patient Name: [name] [name] Date of Service: 05/19/2021 10:15 AM Medical Record Number: [id number] Patient Account Number: [id number] Date of Birth/Sex: [birth year] (87 y.o. F) Treating RN: Huel Coventry Primary Care Provider: Einar Crow Other Clinician: Referring Provider: Einar Crow Treating Provider/Extender: Altamese [place] in Treatment: 6 Active Problems ICD-10 Encounter Code Description Active Date MDM Diagnosis L89.613 Pressure ulcer of right heel, stage 3 04/07/2021 No Yes L89.623 Pressure ulcer of left heel, stage 3 04/07/2021 No Yes Inactive Problems Resolved  Problems Electronic Signature(s) Signed: 05/19/2021 4:14:58 PM By: Baltazar Najjar MD Entered By: Baltazar Najjar on 05/19/2021 10:49:39 [name] [name] ([id number]) -------------------------------------------------------------------------------- Progress Note Details Patient Name: [name] [name] Date of Service: 05/19/2021 10:15 AM Medical Record Number: [id number] Patient Account Number: [id number] Date of Birth/Sex: 03-12-1918 (87 y.o. F) Treating RN: Huel Coventry Primary Care Provider: Einar Crow Other Clinician: Referring Provider: Einar Crow Treating Provider/Extender: Altamese [place] in Treatment: 6 Subjective History of Present Illness (HPI) ADMISSION 04/07/2021 This is a 87 year old woman who is currently a resident at [facility] skilled facility. She is here with her niece who is her primary family member. Prior to December she also was her caregiver. Apparently she was hospitalized from 11/06/2020 through 11/13/2020 with a right femoral neck fracture requiring a hip replacement. She was sent to [facility] for rehabilitation. She developed bilateral heel pressure ulcers over the tip of her heels. She is nonambulatory. Her niece is uncertain about the degree of pressure relief. However they have been using Santyl as the primary dressing for about 2 months. Past medical history includes COPD now O2 dependent, dementia, hypothyroidism, Alzheimer's disease, history of protein calorie malnutrition although her albumin while in the hospital was 3.4, she has essential tremor although she is on Sinemet and has been for many years, hypothyroidism. She is nonambulatory. ABIs in our clinic were 1.45 on the right and 1.36 on the left 5/11; patient with bilateral pressure ulcers on the tips of her heels. We have been using silver collagen. She is at the facility. In spite of concerns that her dressings do not get changed on schedule and she is not wearing her boots  at night, the surface area is actually smaller. 5/25; 2-week follow-up. The right heel continues to do well and is closing in. The left had subcutaneous debris on the surface and although it measures smaller is not anywhere close to healing. 6/8; 2-week follow-up. Both heels continue to better in fact the right heel is for all intents and purposes closed. We have been using silver collagen. She is at [facility] and since the last time we saw her she is now 26!! Objective Constitutional Sitting or standing Blood Pressure is within target range for patient.. Pulse regular and within target range for patient.Marland Kitchen Respirations regular, non- labored and within target range.. Temperature is normal and within the target range for the patient.Marland Kitchen appears in no distress. Vitals Time Taken: 10:25 AM, Height: 64 in, Weight: 100 lbs, BMI: 17.2, Temperature: 96.9 F, Pulse: 96 bpm, Respiratory Rate: 20 breaths/min, Blood Pressure: 119/78  mmHg. General Notes: Wound exam; both wounds on the tip of her heels. I removed some callus and dry skin from the area that I was expecting to be closed on the right there is still a small open area here but very close to closed. The area on the left has a small open area remaining I also took callus from around the wound circumference and some dry skin as well. Minimal bleeding Integumentary (Hair, Skin) Wound #1 status is Open. Original cause of wound was Gradually Appeared. The date acquired was: 01/12/2021. The wound has been in treatment 6 weeks. The wound is located on the Left Calcaneus. The wound measures 0.5cm length x 0.3cm width x 0.4cm depth; 0.118cm^2 area and 0.047cm^3 volume. There is Fat Layer (Subcutaneous Tissue) exposed. There is no tunneling or undermining noted. There is a medium amount of serous drainage noted. There is large (67-100%) red, pink, pale granulation within the wound bed. There is a small (1-33%) amount of necrotic tissue within the wound bed  including Adherent Slough. Wound #2 status is Open. Original cause of wound was Gradually Appeared. The date acquired was: 01/12/2021. The wound has been in treatment 6 weeks. The wound is located on the Right Calcaneus. The wound measures 0.2cm length x 0.2cm width x 0.1cm depth; 0.031cm^2 area and 0.003cm^3 volume. There is Fat Layer (Subcutaneous Tissue) exposed. There is no tunneling or undermining noted. There is a none present amount of drainage noted. There is no granulation within the wound bed. There is a large (67-100%) amount of necrotic tissue within the wound bed including Eschar. [name] RowerWALKER, [place] ([id number]) Assessment Active Problems ICD-10 Pressure ulcer of right heel, stage 3 Pressure ulcer of left heel, stage 3 Procedures Wound #1 Pre-procedure diagnosis of Wound #1 is a Pressure Ulcer located on the Left Calcaneus . There was a Selective/Open Wound Skin/Epidermis Debridement with a total area of 0.15 sq cm performed by Maxwell CaulOBSON, Taqwa Deem G, MD. With the following instrument(s): Curette to remove Non- Viable tissue/material. Material removed includes Callus and Skin: Epidermis and. A time out was conducted at 10:40, prior to the start of the procedure. A Minimum amount of bleeding was controlled with Pressure. The procedure was tolerated well. Post Debridement Measurements: 0.5cm length x 0.3cm width x 0.3cm depth; 0.035cm^3 volume. Post debridement Stage noted as Category/Stage III. Character of Wound/Ulcer Post Debridement is stable. Post procedure Diagnosis Wound #1: Same as Pre-Procedure Wound #2 Pre-procedure diagnosis of Wound #2 is a Pressure Ulcer located on the Right Calcaneus . There was a Selective/Open Wound Skin/Epidermis Debridement with a total area of 0.04 sq cm performed by Maxwell CaulOBSON, Meoshia Billing G, MD. With the following instrument(s): Curette to remove Non- Viable tissue/material. Material removed includes Callus and Skin: Epidermis and. A time out was conducted at  10:40, prior to the start of the procedure. A Minimum amount of bleeding was controlled with Pressure. The procedure was tolerated well. Post Debridement Measurements: 0.2cm length x 0.2cm width x 0.1cm depth; 0.003cm^3 volume. Post debridement Stage noted as Category/Stage III. Character of Wound/Ulcer Post Debridement is stable. Post procedure Diagnosis Wound #2: Same as Pre-Procedure Plan Follow-up Appointments: Return Appointment in 2 weeks. Bathing/ Shower/ Hygiene: May shower; gently cleanse wound with antibacterial soap, rinse and pat dry prior to dressing wounds Off-Loading: Turn and reposition every 2 hours - Keep off-loading (ie: sage boots) booties on feet while in the bed, float heels. Float heels when sitting in wheelchair. WOUND #1: - Calcaneus Wound Laterality: Left Cleanser: Wound Cleanser 3  x Per Week/30 Days Discharge Instructions: Wash your hands with soap and water. Remove old dressing, discard into plastic bag and place into trash. Cleanse the wound with Wound Cleanser prior to applying a clean dressing using gauze sponges, not tissues or cotton balls. Do not scrub or use excessive force. Pat dry using gauze sponges, not tissue or cotton balls. Primary Dressing: Prisma 4.34 (in) 3 x Per Week/30 Days Discharge Instructions: Moisten w/normal saline or sterile water; Cover wound as directed. Do not remove from wound bed. Secondary Dressing: Mepilex Border Flex, 4x4 (in/in) 3 x Per Week/30 Days Discharge Instructions: Apply to wound as directed. Do not cut. WOUND #2: - Calcaneus Wound Laterality: Right Secondary Dressing: Foam Dressing, 4x4 (in/in) 1 x Per Day/30 Days Discharge Instructions: Foam dressing for protection, heel cup applied in office 1. I think on the right heel we can simply go to a heel cup. 2. On the left were still using silver collagen under a heel cup 3. Family member expresses concern about not using her bunny boots in bed at night which is a problem  but fortunately she seems to be making good progress on both wounds at this point Electronic Signature(s) Signed: 05/19/2021 4:14:58 PM By: Baltazar Najjar MD Entered By: Baltazar Najjar on 05/19/2021 10:53:38 [name] [name] ([id number]) [place], Dorene Grebe ([id number]) -------------------------------------------------------------------------------- SuperBill Details Patient Name: [name] [name] Date of Service: 05/19/2021 Medical Record Number: [id number] Patient Account Number: [id number] Date of Birth/Sex: 1918/05/27 (87 y.o. F) Treating RN: Huel Coventry Primary Care Provider: Einar Crow Other Clinician: Referring Provider: Einar Crow Treating Provider/Extender: Altamese [place] in Treatment: 6 Diagnosis Coding ICD-10 Codes Code Description [phone] Pressure ulcer of right heel, stage 3 L89.623 Pressure ulcer of left heel, stage 3 Facility Procedures CPT4 Code: [id number] Description: [phone] - DEBRIDE WOUND 1ST 20 SQ CM OR < Modifier: Quantity: 1 CPT4 Code: Description: ICD-10 Diagnosis Description L89.613 Pressure ulcer of right heel, stage 3 L89.623 Pressure ulcer of left heel, stage 3 Modifier: Quantity: Physician Procedures CPT4 Code: [id number] Description: [id number] - WC PHYS DEBR WO ANESTH 20 SQ CM Modifier: Quantity: 1 CPT4 Code: Description: ICD-10 Diagnosis Description L89.613 Pressure ulcer of right heel, stage 3 L89.623 Pressure ulcer of left heel, stage 3 Modifier: Quantity: Electronic Signature(s) Signed: 05/19/2021 4:14:58 PM By: Baltazar Najjar MD Entered By: Baltazar Najjar on 05/19/2021 10:53:53

## 2021-06-02 ENCOUNTER — Other Ambulatory Visit: Payer: Self-pay

## 2021-06-02 ENCOUNTER — Encounter: Payer: Medicare Other | Admitting: Internal Medicine

## 2021-06-02 DIAGNOSIS — L89613 Pressure ulcer of right heel, stage 3: Secondary | ICD-10-CM | POA: Diagnosis not present

## 2021-06-02 NOTE — Progress Notes (Signed)
TRULY, STANKIEWICZ ([id number]) Visit Report for 06/02/2021 Debridement Details Patient Name: [name] [name], [name] [name] Date of Service: 06/02/2021 10:30 AM Medical Record Number: [id number] Patient Account Number: [id number] Date of Birth/Sex: 01-12-1918 (87 y.o. F) Treating RN: Rogers Blocker Primary Care Provider: Einar Crow Other Clinician: Referring Provider: Einar Crow Treating Provider/Extender: Altamese [place] in Treatment: 8 Debridement Performed for Wound #1 Left Calcaneus Assessment: Performed By: Physician Maxwell Caul, MD Debridement Type: Debridement Level of Consciousness (Pre- Awake and Alert procedure): Pre-procedure Verification/Time Out Yes - 11:06 Taken: Start Time: 11:06 Total Area Debrided (L x W): 1 (cm) x 1 (cm) = 1 (cm) Tissue and other material Non-Viable, Callus, Eschar, Skin: Epidermis debrided: Level: Skin/Epidermis Debridement Description: Selective/Open Wound Instrument: Curette Bleeding: Minimum Hemostasis Achieved: Pressure Response to Treatment: Procedure was tolerated well Level of Consciousness (Post- Awake and Alert procedure): Post Debridement Measurements of Total Wound Length: (cm) 0.5 Stage: Category/Stage III Width: (cm) 0.2 Depth: (cm) 0.1 Volume: (cm) 0.008 Character of Wound/Ulcer Post Debridement: Stable Post Procedure Diagnosis Same as Pre-procedure Electronic Signature(s) Signed: 06/02/2021 4:26:18 PM By: Baltazar Najjar MD Signed: 06/02/2021 4:33:50 PM By: Phillis Haggis, Dondra Prader RN Entered By: Baltazar Najjar on 06/02/2021 11:22:25 [name] [name] ([id number]) -------------------------------------------------------------------------------- HPI Details Patient Name: [name] [name] Date of Service: 06/02/2021 10:30 AM Medical Record Number: [id number] Patient Account Number: [id number] Date of Birth/Sex: 16-Oct-1918 (87 y.o. F) Treating RN: Rogers Blocker Primary Care Provider: Einar Crow Other  Clinician: Referring Provider: Einar Crow Treating Provider/Extender: Altamese  in Treatment: 8 History of Present Illness HPI Description: She cameADMISSION 04/07/2021 This is a 87 year old woman who is currently a resident at [facility] skilled facility. She is here with her niece who is her primary family member. Prior to December she also was her caregiver. Apparently she was hospitalized from 11/06/2020 through 11/13/2020 with a right femoral neck fracture requiring a hip replacement. She was sent to [facility] for rehabilitation. She developed bilateral heel pressure ulcers over the tip of her heels. She is nonambulatory. Her niece is uncertain about the degree of pressure relief. However they have been using Santyl as the primary dressing for about 2 months. Past medical history includes COPD now O2 dependent, dementia, hypothyroidism, Alzheimer's disease, history of protein calorie malnutrition although her albumin while in the hospital was 3.4, she has essential tremor although she is on Sinemet and has been for many years, hypothyroidism. She is nonambulatory. ABIs in our clinic were 1.45 on the right and 1.36 on the left 5/11; patient with bilateral pressure ulcers on the tips of her heels. We have been using silver collagen. She is at the facility. In spite of concerns that her dressings do not get changed on schedule and she is not wearing her boots at night, the surface area is actually smaller. 5/25; 2-week follow-up. The right heel continues to do well and is closing in. The left had subcutaneous debris on the surface and although it measures smaller is not anywhere close to healing. 6/8; 2-week follow-up. Both heels continue to better in fact the right heel is for all intents and purposes closed. We have been using silver collagen. She is at [facility] and since the last time we saw her she is now 89!! 6/22; 2-week follow-up. And with no  dressings on her heels and to be truthful I am not exactly sure what they are doing in the nursing home if anything to these heels. Nevertheless the area on the right is healed. The area on  the left still is open. Thick callus and skin around the edges of the wounds. We are using silver collagen supposedly Electronic Signature(s) Signed: 06/02/2021 4:26:18 PM By: Baltazar Najjar MD Entered By: Baltazar Najjar on 06/02/2021 11:23:16 [name] [name] ([id number]) -------------------------------------------------------------------------------- Physical Exam Details Patient Name: [name] [name] Date of Service: 06/02/2021 10:30 AM Medical Record Number: [id number] Patient Account Number: [id number] Date of Birth/Sex: [birth year] (87 y.o. F) Treating RN: Rogers Blocker Primary Care Provider: Einar Crow Other Clinician: Referring Provider: Einar Crow Treating Provider/Extender: Altamese [place] in Treatment: 8 Constitutional Patient is hypotensive. However she appears well and stable. Pulse regular and within target range for patient.Marland Kitchen Respirations regular, non-labored and within target range.. Temperature is normal and within the target range for the patient.Marland Kitchen appears in no distress. Notes Wound exam; both wounds on the tips of her heels. Callus and dry skin on the right removed with a #5 curette there is nothing open here. On the left she has 2 small open areas after debridement of callus, dry skin. The wounds are small Electronic Signature(s) Signed: 06/02/2021 4:26:18 PM By: Baltazar Najjar MD Entered By: Baltazar Najjar on 06/02/2021 11:25:32 [name] [name] ([id number]) -------------------------------------------------------------------------------- Physician Orders Details Patient Name: [name] [name] Date of Service: 06/02/2021 10:30 AM Medical Record Number: [id number] Patient Account Number: [id number] Date of Birth/Sex: 1918-04-21 (87 y.o. F) Treating RN: Rogers Blocker Primary Care Provider: Einar Crow Other Clinician: Referring Provider: Einar Crow Treating Provider/Extender: Altamese [place] in Treatment: 8 Verbal / Phone Orders: No Diagnosis Coding Follow-up Appointments o Return Appointment in 2 weeks. Bathing/ Shower/ Hygiene o May shower; gently cleanse wound with antibacterial soap, rinse and pat dry prior to dressing wounds Non-Wound Condition o Additional non-wound orders/instructions: - ***RIGHT HEEL-CONTINUE WEARING PROTECTIVE DRESSING*** ***LEFT EAR-TRIPLE ANTIBIOTIC OINTMENT AND BANDAID, CHANGE DAILY*** Off-Loading o Turn and reposition every 2 hours - Keep off-loading (ie: sage boots) booties on feet while in the bed, float heels. Float heels when sitting in wheelchair. Wound Treatment Wound #1 - Calcaneus Wound Laterality: Left Cleanser: Wound Cleanser 3 x Per Week/30 Days Discharge Instructions: Wash your hands with soap and water. Remove old dressing, discard into plastic bag and place into trash. Cleanse the wound with Wound Cleanser prior to applying a clean dressing using gauze sponges, not tissues or cotton balls. Do not scrub or use excessive force. Pat dry using gauze sponges, not tissue or cotton balls. Primary Dressing: Prisma 4.34 (in) 3 x Per Week/30 Days Discharge Instructions: Moisten w/normal saline or sterile water; Cover wound as directed. Do not remove from wound bed. Secondary Dressing: Mepilex Border Flex, 4x4 (in/in) 3 x Per Week/30 Days Discharge Instructions: Apply to wound as directed. Do not cut. Electronic Signature(s) Signed: 06/02/2021 4:26:18 PM By: Baltazar Najjar MD Signed: 06/02/2021 4:33:50 PM By: Phillis Haggis, Dondra Prader RN Entered By: Phillis Haggis, Dondra Prader on 06/02/2021 11:10:57 [name] [name] ([id number]) -------------------------------------------------------------------------------- Problem List Details Patient Name: [name] [name] Date of Service: 06/02/2021  10:30 AM Medical Record Number: [id number] Patient Account Number: [id number] Date of Birth/Sex: 1918-06-25 (87 y.o. F) Treating RN: Rogers Blocker Primary Care Provider: Einar Crow Other Clinician: Referring Provider: Einar Crow Treating Provider/Extender: Altamese [place] in Treatment: 8 Active Problems ICD-10 Encounter Code Description Active Date MDM Diagnosis L89.613 Pressure ulcer of right heel, stage 3 04/07/2021 No Yes L89.623 Pressure ulcer of left heel, stage 3 04/07/2021 No Yes Inactive Problems Resolved Problems Electronic Signature(s) Signed: 06/02/2021 4:26:18 PM By: Baltazar Najjar MD Entered By: Baltazar Najjar on 06/02/2021 11:21:59  [name] [name], [name] [name] ([id number]) -------------------------------------------------------------------------------- Progress Note Details Patient Name: [name] [name], [name] [name] Date of Service: 06/02/2021 10:30 AM Medical Record Number: [id number] Patient Account Number: [id number] Date of Birth/Sex: [birth year] (87 y.o. F) Treating RN: Rogers Blocker Primary Care Provider: Einar Crow Other Clinician: Referring Provider: Einar Crow Treating Provider/Extender: Altamese [place] in Treatment: 8 Subjective History of Present Illness (HPI) She cameADMISSION 04/07/2021 This is a 87 year old woman who is currently a resident at [facility] skilled facility. She is here with her niece who is her primary family member. Prior to December she also was her caregiver. Apparently she was hospitalized from 11/06/2020 through 11/13/2020 with a right femoral neck fracture requiring a hip replacement. She was sent to [facility] for rehabilitation. She developed bilateral heel pressure ulcers over the tip of her heels. She is nonambulatory. Her niece is uncertain about the degree of pressure relief. However they have been using Santyl as the primary dressing for about 2 months. Past medical history includes COPD now O2  dependent, dementia, hypothyroidism, Alzheimer's disease, history of protein calorie malnutrition although her albumin while in the hospital was 3.4, she has essential tremor although she is on Sinemet and has been for many years, hypothyroidism. She is nonambulatory. ABIs in our clinic were 1.45 on the right and 1.36 on the left 5/11; patient with bilateral pressure ulcers on the tips of her heels. We have been using silver collagen. She is at the facility. In spite of concerns that her dressings do not get changed on schedule and she is not wearing her boots at night, the surface area is actually smaller. 5/25; 2-week follow-up. The right heel continues to do well and is closing in. The left had subcutaneous debris on the surface and although it measures smaller is not anywhere close to healing. 6/8; 2-week follow-up. Both heels continue to better in fact the right heel is for all intents and purposes closed. We have been using silver collagen. She is at [facility] and since the last time we saw her she is now 89!! 6/22; 2-week follow-up. And with no dressings on her heels and to be truthful I am not exactly sure what they are doing in the nursing home if anything to these heels. Nevertheless the area on the right is healed. The area on the left still is open. Thick callus and skin around the edges of the wounds. We are using silver collagen supposedly Objective Constitutional Patient is hypotensive. However she appears well and stable. Pulse regular and within target range for patient.Marland Kitchen Respirations regular, non-labored and within target range.. Temperature is normal and within the target range for the patient.Marland Kitchen appears in no distress. Vitals Time Taken: 10:50 AM, Height: 64 in, Weight: 100 lbs, BMI: 17.2, Temperature: 97.8 F, Pulse: 61 bpm, Respiratory Rate: 18 breaths/min, Blood Pressure: 99/57 mmHg. General Notes: Wound exam; both wounds on the tips of her heels. Callus and dry skin  on the right removed with a #5 curette there is nothing open here. On the left she has 2 small open areas after debridement of callus, dry skin. The wounds are small Integumentary (Hair, Skin) Wound #1 status is Open. Original cause of wound was Gradually Appeared. The date acquired was: 01/12/2021. The wound has been in treatment 8 weeks. The wound is located on the Left Calcaneus. The wound measures 0.5cm length x 0.2cm width x 0.1cm depth; 0.079cm^2 area and 0.008cm^3 volume. There is Fat Layer (Subcutaneous Tissue) exposed. There is no tunneling or undermining noted. There  is a medium amount of serous drainage noted. There is large (67-100%) red, pink, pale granulation within the wound bed. There is a small (1-33%) amount of necrotic tissue within the wound bed including Adherent Slough. Wound #2 status is Healed - Epithelialized. Original cause of wound was Gradually Appeared. The date acquired was: 01/12/2021. The wound has been in treatment 8 weeks. The wound is located on the Right Calcaneus. The wound measures 0cm length x 0cm width x 0cm depth; 0cm^2 area and 0cm^3 volume. There is no tunneling or undermining noted. There is a none present amount of drainage noted. There is no granulation within the wound bed. There is no necrotic tissue within the wound bed. [name] [name], [name] [name] ([id number]) Assessment Active Problems ICD-10 Pressure ulcer of right heel, stage 3 Pressure ulcer of left heel, stage 3 Procedures Wound #1 Pre-procedure diagnosis of Wound #1 is a Pressure Ulcer located on the Left Calcaneus . There was a Selective/Open Wound Skin/Epidermis Debridement with a total area of 1 sq cm performed by Maxwell Caul, MD. With the following instrument(s): Curette to remove Non- Viable tissue/material. Material removed includes Eschar, Callus, and Skin: Epidermis. A time out was conducted at 11:06, prior to the start of the procedure. A Minimum amount of bleeding was controlled with  Pressure. The procedure was tolerated well. Post Debridement Measurements: 0.5cm length x 0.2cm width x 0.1cm depth; 0.008cm^3 volume. Post debridement Stage noted as Category/Stage III. Character of Wound/Ulcer Post Debridement is stable. Post procedure Diagnosis Wound #1: Same as Pre-Procedure Plan Follow-up Appointments: Return Appointment in 2 weeks. Bathing/ Shower/ Hygiene: May shower; gently cleanse wound with antibacterial soap, rinse and pat dry prior to dressing wounds Non-Wound Condition: Additional non-wound orders/instructions: - ***RIGHT HEEL-CONTINUE WEARING PROTECTIVE DRESSING*** ***LEFT EAR-TRIPLE ANTIBIOTIC OINTMENT AND BANDAID, CHANGE DAILY*** Off-Loading: Turn and reposition every 2 hours - Keep off-loading (ie: sage boots) booties on feet while in the bed, float heels. Float heels when sitting in wheelchair. WOUND #1: - Calcaneus Wound Laterality: Left Cleanser: Wound Cleanser 3 x Per Week/30 Days Discharge Instructions: Wash your hands with soap and water. Remove old dressing, discard into plastic bag and place into trash. Cleanse the wound with Wound Cleanser prior to applying a clean dressing using gauze sponges, not tissues or cotton balls. Do not scrub or use excessive force. Pat dry using gauze sponges, not tissue or cotton balls. Primary Dressing: Prisma 4.34 (in) 3 x Per Week/30 Days Discharge Instructions: Moisten w/normal saline or sterile water; Cover wound as directed. Do not remove from wound bed. Secondary Dressing: Mepilex Border Flex, 4x4 (in/in) 3 x Per Week/30 Days Discharge Instructions: Apply to wound as directed. Do not cut. 1. Continue with Prisma to the left heel 2. They still should be protecting both heels perhaps with a heel cup and a bunny boot. Apparently the bunny boots are not being used however. She did not come in with any dressings on Electronic Signature(s) Signed: 06/02/2021 4:26:18 PM By: Baltazar Najjar MD Entered By: Baltazar Najjar on 06/02/2021 11:26:06 [name] [name] ([id number]) -------------------------------------------------------------------------------- SuperBill Details Patient Name: [name] [name] Date of Service: 06/02/2021 Medical Record Number: [id number] Patient Account Number: [id number] Date of Birth/Sex: April 07, 1918 (87 y.o. F) Treating RN: Rogers Blocker Primary Care Provider: Einar Crow Other Clinician: Referring Provider: Einar Crow Treating Provider/Extender: Altamese  in Treatment: 8 Diagnosis Coding ICD-10 Codes Code Description [phone] Pressure ulcer of right heel, stage 3 L89.623 Pressure ulcer of left heel, stage 3 Facility Procedures CPT4 Code: [id number] Description:  [id number] - DEBRIDE WOUND 1ST 20 SQ CM OR < Modifier: Quantity: 1 CPT4 Code: Description: ICD-10 Diagnosis Description L89.623 Pressure ulcer of left heel, stage 3 Modifier: Quantity: Physician Procedures CPT4 Code: [id number] Description: [id number] - WC PHYS DEBR WO ANESTH 20 SQ CM Modifier: Quantity: 1 CPT4 Code: Description: ICD-10 Diagnosis Description L89.623 Pressure ulcer of left heel, stage 3 Modifier: Quantity: Electronic Signature(s) Signed: 06/02/2021 4:26:18 PM By: Baltazar Najjarobson, Ki Corbo MD Entered By: Baltazar Najjarobson, Venise [place] on 06/02/2021 11:26:18

## 2021-06-03 NOTE — Progress Notes (Signed)
JAZLYNNE, MILLINER ([id number]) Visit Report for 06/02/2021 Arrival Information Details Patient Name: [name] [name], [name] [name] Date of Service: 06/02/2021 10:30 AM Medical Record Number: [id number] Patient Account Number: [id number] Date of Birth/Sex: 09-11-1918 (87 y.o. F) Treating RN: Yevonne Pax Primary Care [place] Kuang: Einar Crow Other Clinician: Referring Girtie Wiersma: Einar Crow Treating Jezabel Lecker/Extender: Altamese [place] in Treatment: 8 Visit Information History Since Last Visit All ordered tests and consults were completed: No Patient Arrived: Wheel Chair Added or deleted any medications: No Arrival Time: 10:40 Any new allergies or adverse reactions: No Accompanied By: daughter Had a fall or experienced change in No Transfer Assistance: Manual activities of daily living that may affect Patient Identification Verified: Yes risk of falls: Secondary Verification Process Completed: Yes Signs or symptoms of abuse/neglect since last visito No Patient Requires Transmission-Based Precautions: No Hospitalized since last visit: No Patient Has Alerts: Yes Implantable device outside of the clinic excluding No Patient Alerts: NOT diabetic cellular tissue based products placed in the center 2 person pivot since last visit: Has Dressing in Place as Prescribed: Yes Pain Present Now: No Electronic Signature(s) Signed: 06/02/2021 4:27:57 PM By: Yevonne Pax RN Entered By: Yevonne Pax on 06/02/2021 10:50:15 [name] [name] ([id number]) -------------------------------------------------------------------------------- Clinic Level of Care Assessment Details Patient Name: [name] [name] Date of Service: 06/02/2021 10:30 AM Medical Record Number: [id number] Patient Account Number: [id number] Date of Birth/Sex: 02/24/1918 (87 y.o. F) Treating RN: Rogers Blocker Primary Care Cathi Hazan: Einar Crow Other Clinician: Referring Kameo Bains: Einar Crow Treating Donis Kotowski/Extender:  Altamese [place] in Treatment: 8 Clinic Level of Care Assessment Items TOOL 1 Quantity Score []  - Use when EandM and Procedure is performed on INITIAL visit 0 ASSESSMENTS - Nursing Assessment / Reassessment []  - General Physical Exam (combine w/ comprehensive assessment (listed just below) when performed on new 0 pt. evals) []  - 0 Comprehensive Assessment (HX, ROS, Risk Assessments, Wounds Hx, etc.) ASSESSMENTS - Wound and Skin Assessment / Reassessment []  - Dermatologic / Skin Assessment (not related to wound area) 0 ASSESSMENTS - Ostomy and/or Continence Assessment and Care []  - Incontinence Assessment and Management 0 []  - 0 Ostomy Care Assessment and Management (repouching, etc.) PROCESS - Coordination of Care []  - Simple Patient / Family Education for ongoing care 0 []  - 0 Complex (extensive) Patient / Family Education for ongoing care []  - 0 Staff obtains , Records, Test Results / Process Orders []  - 0 Staff telephones HHA, Nursing Homes / Clarify orders / etc []  - 0 Routine Transfer to another Facility (non-emergent condition) []  - 0 Routine Hospital Admission (non-emergent condition) []  - 0 New Admissions / / Ordering NPWT, Apligraf, etc. []  - 0 Emergency Hospital Admission (emergent condition) PROCESS - Special Needs []  - Pediatric / Minor Patient Management 0 []  - 0 Isolation Patient Management []  - 0 Hearing / Language / Visual special needs []  - 0 Assessment of Community assistance (transportation, D/C planning, etc.) []  - 0 Additional assistance / Altered mentation []  - 0 Support Surface(s) Assessment (bed, cushion, seat, etc.) INTERVENTIONS - Miscellaneous []  - External ear exam 0 []  - 0 Patient Transfer (multiple staff / / Similar devices) []  - 0 Simple Staple / Suture removal (25 or less) []  - 0 Complex Staple / Suture removal (26 or more) []  - 0 Hypo/Hyperglycemic Management (do not check if  billed separately) []  - 0 Ankle / Brachial Index (ABI) - do not check if billed separately Has the patient been seen at the hospital within the last three  years: Yes Total Score: 0 Level Of Care: ____ [name] [name], [name] [name] ([id number]) Electronic Signature(s) Signed: 06/02/2021 4:33:50 PM By: Phillis HaggisSanchez Pereyda, Dondra PraderKenia RN Entered By: Phillis HaggisSanchez Pereyda, Dondra PraderKenia on 06/02/2021 11:11:09 [name] [name], [name] [name] ([id number]) -------------------------------------------------------------------------------- Encounter Discharge Information Details Patient Name: [name] [name], [name] [name] Date of Service: 06/02/2021 10:30 AM Medical Record Number: [id number] Patient Account Number: [id number] Date of Birth/Sex: 10/08/1918 (87 y.o. F) Treating RN: Rogers BlockerSanchez, Kenia Primary Care Dyon Rotert: Einar CrowAnderson, Marshall Other Clinician: Referring Kathlyne Loud: Einar CrowAnderson, Marshall Treating Irys Nigh/Extender: Altamese CarolinaOBSON, MICHAEL G Weeks in Treatment: 8 Encounter Discharge Information Items Post Procedure Vitals Discharge Condition: Stable Temperature (F): 97.8 Ambulatory Status: Wheelchair Pulse (bpm): 61 Discharge Destination: Skilled Nursing Facility Respiratory Rate (breaths/min): 18 Telephoned: No Blood Pressure (mmHg): 99/57 Orders Sent: Yes Transportation: Private Auto Accompanied By: daughter Schedule Follow-up Appointment: Yes Clinical Summary of Care: Electronic Signature(s) Signed: 06/03/2021 4:21:06 PM By: Lolita CramBurnette, Kyara Entered By: Lolita CramBurnette, Kyara on 06/02/2021 11:28:20 [name] [name], [name] [name] ([id number]) -------------------------------------------------------------------------------- Lower Extremity Assessment Details Patient Name: [name] [name], [name] [name] Date of Service: 06/02/2021 10:30 AM Medical Record Number: [id number] Patient Account Number: [id number] Date of Birth/Sex: 03/20/1918 (87 y.o. F) Treating RN: Yevonne PaxEpps, Carrie Primary Care Helene Bernstein: Einar CrowAnderson, Marshall Other Clinician: Referring Judeth Gilles: Einar CrowAnderson, Marshall Treating Piotr Christopher/Extender:  Altamese CarolinaOBSON, MICHAEL G Weeks in Treatment: 8 Vascular Assessment Pulses: Dorsalis Pedis Palpable: [Left:Yes] [Right:Yes] Electronic Signature(s) Signed: 06/02/2021 4:27:57 PM By: Yevonne PaxEpps, Carrie RN Entered By: Yevonne PaxEpps, Carrie on 06/02/2021 10:58:57 [name] [name], [name] [name] ([id number]) -------------------------------------------------------------------------------- Multi Wound Chart Details Patient Name: [name] [name], Kirti Date of Service: 06/02/2021 10:30 AM Medical Record Number: [id number] Patient Account Number: [id number] Date of Birth/Sex: 12/01/1918 (87 y.o. F) Treating RN: Rogers BlockerSanchez, Kenia Primary Care Aubriegh Minch: Einar CrowAnderson, Marshall Other Clinician: Referring Ashrith Sagan: Einar CrowAnderson, Marshall Treating Jonalyn Sedlak/Extender: Altamese CarolinaOBSON, MICHAEL G Weeks in Treatment: 8 Vital Signs Height(in): 64 Pulse(bpm): 61 Weight(lbs): 100 Blood Pressure(mmHg): 99/57 Body Mass Index(BMI): 17 Temperature(F): 97.8 Respiratory Rate(breaths/min): 18 Photos: [N/A:N/A] Wound Location: Left Calcaneus Right Calcaneus N/A Wounding Event: Gradually Appeared Gradually Appeared N/A Primary Etiology: Pressure Ulcer Pressure Ulcer N/A Comorbid History: Chronic Obstructive Pulmonary Chronic Obstructive Pulmonary N/A Disease (COPD), Dementia Disease (COPD), Dementia Date Acquired: 01/12/2021 01/12/2021 N/A Weeks of Treatment: 8 8 N/A Wound Status: Open Healed - Epithelialized N/A Measurements L x W x D (cm) 0.5x0.2x0.1 0x0x0 N/A Area (cm) : 0.079 0 N/A Volume (cm) : 0.008 0 N/A % Reduction in Area: 89.50% 100.00% N/A % Reduction in Volume: 96.50% 100.00% N/A Classification: Category/Stage III Category/Stage III N/A Exudate Amount: Medium None Present N/A Exudate Type: Serous N/A N/A Exudate Color: amber N/A N/A Granulation Amount: Large (67-100%) None Present (0%) N/A Granulation Quality: Red, Pink, Pale N/A N/A Necrotic Amount: Small (1-33%) None Present (0%) N/A Exposed Structures: Fat Layer (Subcutaneous Tissue): Fascia: No  N/A Yes Fat Layer (Subcutaneous Tissue): Fascia: No No Tendon: No Tendon: No Muscle: No Muscle: No Joint: No Joint: No Bone: No Bone: No Epithelialization: None Large (67-100%) N/A Debridement: Debridement - Selective/Open N/A N/A Wound Pre-procedure Verification/Time 11:06 N/A N/A Out Taken: Tissue Debrided: Callus N/A N/A Level: Non-Viable Tissue N/A N/A Debridement Area (sq cm): 1 N/A N/A Instrument: Curette N/A N/A Bleeding: Minimum N/A N/A Hemostasis Achieved: Pressure N/A N/A Debridement Treatment Procedure was tolerated well N/A N/A Response: Post Debridement 0.5x0.2x0.1 N/A N/A Measurements L x W x D (cm) 0.008 N/A N/A [name] [name], [name] [name] ([id number]) Post Debridement Volume: (cm) Post Debridement Stage: Category/Stage III N/A N/A Procedures Performed: Debridement N/A N/A Treatment Notes Electronic Signature(s) Signed: 06/02/2021 4:26:18 PM By: Baltazar Najjarobson, Michael MD Entered By: Baltazar Najjarobson, Michael on 06/02/2021 11:22:09 [name] [name], Aneeka ([id number]) --------------------------------------------------------------------------------  Multi-Disciplinary Care Plan Details Patient Name: [name] [name], [name] [name] Date of Service: 06/02/2021 10:30 AM Medical Record Number: [id number] Patient Account Number: [id number] Date of Birth/Sex: 1918/11/24 (87 y.o. F) Treating RN: Rogers Blocker Primary Care Roshaunda Starkey: Einar Crow Other Clinician: Referring Shaunda Tipping: Einar Crow Treating Eleaner Dibartolo/Extender: Altamese [place] in Treatment: 8 Active Inactive Necrotic Tissue Nursing Diagnoses: Impaired tissue integrity related to necrotic/devitalized tissue Goals: Necrotic/devitalized tissue will be minimized in the wound bed Date Initiated: 04/07/2021 Target Resolution Date: 04/15/2021 Goal Status: Active Interventions: Assess patient pain level pre-, during and post procedure and prior to discharge Treatment Activities: Apply topical anesthetic as ordered :  04/07/2021 Notes: Pressure Nursing Diagnoses: Knowledge deficit related to management of pressures ulcers Goals: Patient will remain free from development of additional pressure ulcers Date Initiated: 04/07/2021 Target Resolution Date: 04/07/2021 Goal Status: Active Patient will remain free of pressure ulcers Date Initiated: 04/07/2021 Target Resolution Date: 04/07/2021 Goal Status: Active Interventions: Provide education on pressure ulcers Notes: Wound/Skin Impairment Nursing Diagnoses: Impaired tissue integrity Goals: Ulcer/skin breakdown will have a volume reduction of 30% by week 4 Date Initiated: 04/07/2021 Target Resolution Date: 05/07/2021 Goal Status: Active Interventions: Assess ulceration(s) every visit Notes: Electronic Signature(s) Signed: 06/02/2021 4:33:50 PM By: Phillis Haggis, Dondra Prader RN Entered By: Phillis Haggis, Dondra Prader on 06/02/2021 11:07:51 [name] [name] ([id number]) [name] Humphreys, Dorene Grebe ([id number]) -------------------------------------------------------------------------------- Pain Assessment Details Patient Name: [name] [name] Date of Service: 06/02/2021 10:30 AM Medical Record Number: [id number] Patient Account Number: [id number] Date of Birth/Sex: 1918/08/16 (87 y.o. F) Treating RN: Yevonne Pax Primary Care Derrall Hicks: Einar Crow Other Clinician: Referring Miosotis Wetsel: Einar Crow Treating Phyllip Claw/Extender: Altamese [place] in Treatment: 8 Active Problems Location of Pain Severity and Description of Pain Patient Has Paino No Site Locations Pain Management and Medication Current Pain Management: Electronic Signature(s) Signed: 06/02/2021 4:27:57 PM By: Yevonne Pax RN Entered By: Yevonne Pax on 06/02/2021 10:50:49 [name] [name] ([id number]) -------------------------------------------------------------------------------- Patient/Caregiver Education Details Patient Name: [name] [name] Date of Service: 06/02/2021 10:30 AM Medical  Record Number: [id number] Patient Account Number: [id number] Date of Birth/Gender: [birth year] (87 y.o. F) Treating RN: Rogers Blocker Primary Care Physician: Einar Crow Other Clinician: Referring Physician: Einar Crow Treating Physician/Extender: Altamese [place] in Treatment: 8 Education Assessment Education Provided To: Patient Education Topics Provided Wound Debridement: Methods: Explain/Verbal Responses: State content correctly Wound/Skin Impairment: Methods: Explain/Verbal Responses: State content correctly Electronic Signature(s) Signed: 06/02/2021 4:33:50 PM By: Phillis Haggis, Dondra Prader RN Entered By: Phillis Haggis, Dondra Prader on 06/02/2021 11:11:25 [name] [name] ([id number]) -------------------------------------------------------------------------------- Wound Assessment Details Patient Name: [name] [name] Date of Service: 06/02/2021 10:30 AM Medical Record Number: [id number] Patient Account Number: [id number] Date of Birth/Sex: June 21, 1918 (87 y.o. F) Treating RN: Yevonne Pax Primary Care Grayson White: Einar Crow Other Clinician: Referring Todd Jelinski: Einar Crow Treating Solana Coggin/Extender: Altamese [place] in Treatment: 8 Wound Status Wound Number: 1 Primary Etiology: Pressure Ulcer Wound Location: Left Calcaneus Wound Status: Open Wounding Event: Gradually Appeared Comorbid Chronic Obstructive Pulmonary Disease (COPD), History: Dementia Date Acquired: 01/12/2021 Weeks Of Treatment: 8 Clustered Wound: No Photos Wound Measurements Length: (cm) 0.5 Width: (cm) 0.2 Depth: (cm) 0.1 Area: (cm) 0.079 Volume: (cm) 0.008 % Reduction in Area: 89.5% % Reduction in Volume: 96.5% Epithelialization: None Tunneling: No Undermining: No Wound Description Classification: Category/Stage III Exudate Amount: Medium Exudate Type: Serous Exudate Color: amber Foul Odor After Cleansing: No Slough/Fibrino Yes Wound Bed Granulation Amount:  Large (67-100%) Exposed Structure Granulation Quality: Red, Pink, Pale Fascia Exposed: No Necrotic Amount: Small (1-33%) Fat Layer (Subcutaneous Tissue) Exposed: Yes Necrotic Quality:  Adherent Slough Tendon Exposed: No Muscle Exposed: No Joint Exposed: No Bone Exposed: No Treatment Notes Wound #1 (Calcaneus) Wound Laterality: Left Cleanser Wound Cleanser Discharge Instruction: Wash your hands with soap and water. Remove old dressing, discard into plastic bag and place into trash. Cleanse the wound with Wound Cleanser prior to applying a clean dressing using gauze sponges, not tissues or cotton balls. Do not scrub or use excessive force. Pat dry using gauze sponges, not tissue or cotton balls. KIRSTY, MONJARAZ ([id number]) Peri-Wound Care Topical Primary Dressing Prisma 4.34 (in) Discharge Instruction: Moisten w/normal saline or sterile water; Cover wound as directed. Do not remove from wound bed. Secondary Dressing Mepilex Border Flex, 4x4 (in/in) Discharge Instruction: Apply to wound as directed. Do not cut. Secured With Compression Wrap Compression Stockings Facilities manager) Signed: 06/02/2021 4:27:57 PM By: Yevonne Pax RN Entered By: Yevonne Pax on 06/02/2021 10:57:59 [name] [name] ([id number]) -------------------------------------------------------------------------------- Wound Assessment Details Patient Name: [name] [name] Date of Service: 06/02/2021 10:30 AM Medical Record Number: [id number] Patient Account Number: [id number] Date of Birth/Sex: [birth year] (87 y.o. F) Treating RN: Yevonne Pax Primary Care Paxon Propes: Einar Crow Other Clinician: Referring Sholanda Croson: Einar Crow Treating Shyhiem Beeney/Extender: Altamese [place] in Treatment: 8 Wound Status Wound Number: 2 Primary Etiology: Pressure Ulcer Wound Location: Right Calcaneus Wound Status: Healed - Epithelialized Wounding Event: Gradually Appeared Comorbid Chronic  Obstructive Pulmonary Disease (COPD), History: Dementia Date Acquired: 01/12/2021 Weeks Of Treatment: 8 Clustered Wound: No Photos Wound Measurements Length: (cm) 0 Width: (cm) 0 Depth: (cm) 0 Area: (cm) 0 Volume: (cm) 0 % Reduction in Area: 100% % Reduction in Volume: 100% Epithelialization: Large (67-100%) Tunneling: No Undermining: No Wound Description Classification: Category/Stage III Exudate Amount: None Present Foul Odor After Cleansing: No Slough/Fibrino No Wound Bed Granulation Amount: None Present (0%) Exposed Structure Necrotic Amount: None Present (0%) Fascia Exposed: No Fat Layer (Subcutaneous Tissue) Exposed: No Tendon Exposed: No Muscle Exposed: No Joint Exposed: No Bone Exposed: No Electronic Signature(s) Signed: 06/02/2021 4:27:57 PM By: Yevonne Pax RN Signed: 06/02/2021 4:33:50 PM By: Phillis Haggis, Dondra Prader RN Entered By: Phillis Haggis, Dondra Prader on 06/02/2021 11:07:42 [name] [name] ([id number]) -------------------------------------------------------------------------------- Vitals Details Patient Name: [name] [name] Date of Service: 06/02/2021 10:30 AM Medical Record Number: [id number] Patient Account Number: [id number] Date of Birth/Sex: [birth year] (87 y.o. F) Treating RN: Yevonne Pax Primary Care Anayansi Rundquist: Einar Crow Other Clinician: Referring Jodean Valade: Einar Crow Treating Cacie Gaskins/Extender: Altamese [place] in Treatment: 8 Vital Signs Time Taken: 10:50 Temperature (F): 97.8 Height (in): 64 Pulse (bpm): 61 Weight (lbs): 100 Respiratory Rate (breaths/min): 18 Body Mass Index (BMI): 17.2 Blood Pressure (mmHg): 99/57 Reference Range: 80 - 120 mg / dl Electronic Signature(s) Signed: 06/02/2021 4:27:57 PM By: Yevonne Pax RN Entered By: Yevonne Pax on 06/02/2021 10:50:36

## 2021-06-16 ENCOUNTER — Ambulatory Visit: Payer: TRICARE For Life (TFL) | Admitting: Internal Medicine

## 2021-06-23 ENCOUNTER — Encounter: Payer: Medicare Other | Attending: Internal Medicine | Admitting: Internal Medicine

## 2021-06-23 ENCOUNTER — Other Ambulatory Visit: Payer: Self-pay

## 2021-06-23 DIAGNOSIS — L89623 Pressure ulcer of left heel, stage 3: Secondary | ICD-10-CM | POA: Diagnosis present

## 2021-06-23 DIAGNOSIS — J449 Chronic obstructive pulmonary disease, unspecified: Secondary | ICD-10-CM | POA: Diagnosis not present

## 2021-06-23 DIAGNOSIS — Z9981 Dependence on supplemental oxygen: Secondary | ICD-10-CM | POA: Diagnosis not present

## 2021-06-23 DIAGNOSIS — L89613 Pressure ulcer of right heel, stage 3: Secondary | ICD-10-CM | POA: Diagnosis not present

## 2021-06-23 NOTE — Progress Notes (Addendum)
[name] [name], [name] [name] ([id number]) Visit Report for 06/23/2021 Arrival Information Details Patient Name: [name] [name], [name] [name] Date of Service: 06/23/2021 10:30 AM Medical Record Number: [id number] Patient Account Number: [id number] Date of Birth/Sex: 1918/06/30 (87 y.o. F) Treating RN: Hansel Feinstein Primary Care Colton Tassin: Einar Crow Other Clinician: Referring Kahleah Crass: Einar Crow Treating Shuntae Herzig/Extender: Tilda Franco in Treatment: 11 Visit Information History Since Last Visit Added or deleted any medications: No Patient Arrived: Wheel Chair Had a fall or experienced change in No Arrival Time: 10:50 activities of daily living that may affect Accompanied By: POA risk of falls: Transfer Assistance: Manual Hospitalized since last visit: No Patient Identification Verified: Yes Has Dressing in Place as Prescribed: Yes Secondary Verification Process Completed: Yes Pain Present Now: No Patient Requires Transmission-Based Precautions: No Patient Has Alerts: Yes Patient Alerts: NOT diabetic 2 person pivot Electronic Signature(s) Signed: 06/23/2021 1:05:01 PM By: Hansel Feinstein Entered By: Hansel Feinstein on 06/23/2021 10:50:54 [name] [name] ([id number]) -------------------------------------------------------------------------------- Clinic Level of Care Assessment Details Patient Name: [name] [name] Date of Service: 06/23/2021 10:30 AM Medical Record Number: [id number] Patient Account Number: [id number] Date of Birth/Sex: 05-11-1918 (87 y.o. F) Treating RN: Hansel Feinstein Primary Care Kirkland Figg: Einar Crow Other Clinician: Referring Shaunessy Dobratz: Einar Crow Treating Linet Brash/Extender: Tilda Franco in Treatment: 11 Clinic Level of Care Assessment Items TOOL 4 Quantity Score []  - Use when only an EandM is performed on FOLLOW-UP visit 0 ASSESSMENTS - Nursing Assessment / Reassessment []  - Reassessment of Co-morbidities (includes updates in patient status)  0 []  - 0 Reassessment of Adherence to Treatment Plan ASSESSMENTS - Wound and Skin Assessment / Reassessment X - Simple Wound Assessment / Reassessment - one wound 1 5 []  - 0 Complex Wound Assessment / Reassessment - multiple wounds []  - 0 Dermatologic / Skin Assessment (not related to wound area) ASSESSMENTS - Focused Assessment []  - Circumferential Edema Measurements - multi extremities 0 []  - 0 Nutritional Assessment / Counseling / Intervention []  - 0 Lower Extremity Assessment (monofilament, tuning fork, pulses) []  - 0 Peripheral Arterial Disease Assessment (using hand held doppler) ASSESSMENTS - Ostomy and/or Continence Assessment and Care []  - Incontinence Assessment and Management 0 []  - 0 Ostomy Care Assessment and Management (repouching, etc.) PROCESS - Coordination of Care X - Simple Patient / Family Education for ongoing care 1 15 []  - 0 Complex (extensive) Patient / Family Education for ongoing care []  - 0 Staff obtains , Records, Test Results / Process Orders []  - 0 Staff telephones HHA, Nursing Homes / Clarify orders / etc []  - 0 Routine Transfer to another Facility (non-emergent condition) []  - 0 Routine Hospital Admission (non-emergent condition) []  - 0 New Admissions / / Ordering NPWT, Apligraf, etc. []  - 0 Emergency Hospital Admission (emergent condition) X- 1 10 Simple Discharge Coordination []  - 0 Complex (extensive) Discharge Coordination PROCESS - Special Needs []  - Pediatric / Minor Patient Management 0 []  - 0 Isolation Patient Management []  - 0 Hearing / Language / Visual special needs []  - 0 Assessment of Community assistance (transportation, D/C planning, etc.) []  - 0 Additional assistance / Altered mentation []  - 0 Support Surface(s) Assessment (bed, cushion, seat, etc.) INTERVENTIONS - Wound Cleansing / Measurement Mcmeans, Anastyn ( ) X- 1 5 Simple Wound Cleansing - one wound []  - 0 Complex  Wound Cleansing - multiple wounds []  - 0 Wound Imaging (photographs - any number of wounds) []  - 0 Wound Tracing (instead of photographs) X- 1 5 Simple Wound Measurement - one wound []  - 0 Complex  Wound Measurement - multiple wounds INTERVENTIONS - Wound Dressings X - Small Wound Dressing one or multiple wounds 1 10 []  - 0 Medium Wound Dressing one or multiple wounds []  - 0 Large Wound Dressing one or multiple wounds []  - 0 Application of Medications - topical []  - 0 Application of Medications - injection INTERVENTIONS - Miscellaneous []  - External ear exam 0 []  - 0 Specimen Collection (cultures, biopsies, blood, body fluids, etc.) []  - 0 Specimen(s) / Culture(s) sent or taken to Lab for analysis []  - 0 Patient Transfer (multiple staff / / Similar devices) []  - 0 Simple Staple / Suture removal (25 or less) []  - 0 Complex Staple / Suture removal (26 or more) []  - 0 Hypo / Hyperglycemic Management (close monitor of Blood Glucose) []  - 0 Ankle / Brachial Index (ABI) - do not check if billed separately X- 1 5 Vital Signs Has the patient been seen at the hospital within the last three years: Yes Total Score: 55 Level Of Care: New/Established - Level 2 Electronic Signature(s) Signed: 06/23/2021 1:05:01 PM By: Entered By: on 06/23/2021 11:28:12 ( ) -------------------------------------------------------------------------------- Encounter Discharge Information Details Patient Name: Date of Service: 06/23/2021 10:30 AM Medical Record Number: Patient Account Number: Date of Birth/Sex: Apr 29, 1918 (87 y.o. F) Treating RN: Primary Care Sheza Strickland: 06/25/2021 Other Clinician: Referring Shian Goodnow: Hansel Feinstein Treating Danilynn Jemison/Extender: Hansel Feinstein in Treatment: 11 Encounter Discharge Information Items Discharge Condition: Stable Ambulatory Status:  Wheelchair Discharge Destination: Skilled Nursing Facility Telephoned: No Orders Sent: Yes Transportation: Other Accompanied By: 06/25/2021 Schedule Follow-up Appointment: Yes Clinical Summary of Care: Electronic Signature(s) Signed: 06/23/2021 1:05:01 PM By: [id number] Entered By: [name] [name] on 06/23/2021 11:48:04 [id number] ([id number]) -------------------------------------------------------------------------------- Lower Extremity Assessment Details Patient Name: 07/16/1918 Date of Service: 06/23/2021 10:30 AM Medical Record Number: Einar Crow Patient Account Number: Einar Crow Date of Birth/Sex: 1918/01/31 (87 y.o. F) Treating RN: Melina Modena Primary Care Slate Debroux: 06/25/2021 Other Clinician: Referring Chaundra Abreu: Hansel Feinstein Treating Reiley Keisler/Extender: Hansel Feinstein in Treatment: 11 Edema Assessment Assessed: 06/25/2021: Yes] [name] [name]: Yes] Edema: [Left: No] [Right: No] Vascular Assessment Pulses: Dorsalis Pedis Palpable: [Left:Yes] Electronic Signature(s) Signed: 06/23/2021 1:05:01 PM By: [name] [name] Entered By: 06/25/2021 on 06/23/2021 11:02:56 [id number] (07/16/1918) -------------------------------------------------------------------------------- Multi Wound Chart Details Patient Name: Hansel Feinstein Date of Service: 06/23/2021 10:30 AM Medical Record Number: Einar Crow Patient Account Number: Tilda Franco Date of Birth/Sex: 08-Oct-1918 (87 y.o. F) Treating RN: Franne Forts Primary Care Davien Malone: 06/25/2021 Other Clinician: Referring Anora Schwenke: Hansel Feinstein Treating Delmi Fulfer/Extender: Hansel Feinstein in Treatment: 11 Vital Signs Height(in): 64 Pulse(bpm): 56 Weight(lbs): 100 Blood Pressure(mmHg): 114/75 Body Mass Index(BMI): 17 Temperature(F): 98.5 Respiratory Rate(breaths/min): 16 Photos: [N/A:N/A] Wound Location: Left Calcaneus N/A N/A Wounding Event: Gradually Appeared N/A N/A Primary Etiology: Pressure Ulcer N/A  N/A Comorbid History: Chronic Obstructive Pulmonary N/A N/A Disease (COPD), Dementia Date Acquired: 01/12/2021 N/A N/A Weeks of Treatment: 11 N/A N/A Wound Status: Open N/A N/A Measurements L x W x D (cm) 0.2x0.3x0.3 N/A N/A Area (cm) : 0.047 N/A N/A Volume (cm) : 0.014 N/A N/A % Reduction in Area: 93.80% N/A N/A % Reduction in Volume: 93.80% N/A N/A Starting Position 1 (o'clock): 10 Ending Position 1 (o'clock): 11 Maximum Distance 1 (cm): 0.3 Undermining: Yes N/A N/A Classification: Category/Stage III N/A N/A Exudate Amount: Medium N/A N/A Exudate Type: Serosanguineous N/A N/A Exudate Color: red, brown N/A N/A Granulation Amount: Large (67-100%) N/A N/A Granulation Quality:  Red, Pink, Pale N/A N/A Necrotic Amount: Small (1-33%) N/A N/A Exposed Structures: Fat Layer (Subcutaneous Tissue): N/A N/A Yes Fascia: No Tendon: No Muscle: No Joint: No Bone: No Epithelialization: Small (1-33%) N/A N/A Treatment Notes Wound #1 (Calcaneus) Wound Laterality: Left Cleanser Soap and Water Discharge Instruction: Gently cleanse wound with antibacterial soap, rinse and pat dry prior to dressing wounds Tegethoff, Amiliah ([id number]) Peri-Wound Care Topical Primary Dressing Prisma 4.34 (in) Discharge Instruction: Moisten w/normal saline or sterile water; Cover wound as directed. Do not remove from wound bed. Secondary Dressing Foam Dressing, 4x4 (in/in) Discharge Instruction: CUT FOAM DONUT TO BORDER WOUND AND KEEP PRESSURE OFF Mepilex Border Flex, 4x4 (in/in) Discharge Instruction: Apply to LEFT HEEL as directed AND RIGHT HEEL FOR PROTECTION. Do not cut. Secured With Compression Wrap Compression Stockings Add-Ons Electronic Signature(s) Signed: 06/23/2021 2:20:53 PM By: Geralyn CorwinHoffman, Jessica DO Previous Signature: 06/23/2021 1:05:01 PM Version By: Hansel FeinsteinBishop, Joy Entered By: Geralyn CorwinHoffman, Jessica on 06/23/2021 13:17:50 [name] [name], [name] [name]  ([id number]) -------------------------------------------------------------------------------- Multi-Disciplinary Care Plan Details Patient Name: [name] [name], [name] [name] Date of Service: 06/23/2021 10:30 AM Medical Record Number: [id number] Patient Account Number: [id number] Date of Birth/Sex: 09/20/1918 (87 y.o. F) Treating RN: Hansel FeinsteinBishop, Joy Primary Care Dameian Crisman: Einar CrowAnderson, Marshall Other Clinician: Referring Ewen Varnell: Einar CrowAnderson, Marshall Treating Breklyn Fabrizio/Extender: Tilda FrancoHoffman, Jessica Weeks in Treatment: 11 Active Inactive Electronic Signature(s) Signed: 06/23/2021 1:05:01 PM By: Hansel FeinsteinBishop, Joy Entered By: Hansel FeinsteinBishop, Joy on 06/23/2021 11:03:30 [name] [name], [name] [name] ([id number]) -------------------------------------------------------------------------------- Pain Assessment Details Patient Name: [name] [name], [name] [name] Date of Service: 06/23/2021 10:30 AM Medical Record Number: [id number] Patient Account Number: [id number] Date of Birth/Sex: 03/26/1918 (87 y.o. F) Treating RN: Hansel FeinsteinBishop, Joy Primary Care Delorise Hunkele: Einar CrowAnderson, Marshall Other Clinician: Referring Gabriele Loveland: Einar CrowAnderson, Marshall Treating Michie Molnar/Extender: Tilda FrancoHoffman, Jessica Weeks in Treatment: 11 Active Problems Location of Pain Severity and Description of Pain Patient Has Paino No Site Locations Rate the pain. Current Pain Level: 0 Pain Management and Medication Current Pain Management: Electronic Signature(s) Signed: 06/23/2021 1:05:01 PM By: Hansel FeinsteinBishop, Joy Entered By: Hansel FeinsteinBishop, Joy on 06/23/2021 10:56:03 [name] [name], [name] [name] ([id number]) -------------------------------------------------------------------------------- Patient/Caregiver Education Details Patient Name: [name] [name], [name] [name] Date of Service: 06/23/2021 10:30 AM Medical Record Number: [id number] Patient Account Number: [id number] Date of Birth/Gender: 09/02/1918 (87 y.o. F) Treating RN: Hansel FeinsteinBishop, Joy Primary Care Physician: Einar CrowAnderson, Marshall Other Clinician: Referring Physician: Einar CrowAnderson, Marshall Treating  Physician/Extender: Tilda FrancoHoffman, Jessica Weeks in Treatment: 11 Education Assessment Education Provided To: Patient and Caregiver POA Education Topics Provided Basic Hygiene: Offloading: Wound/Skin Impairment: Electronic Signature(s) Signed: 06/23/2021 1:05:01 PM By: Hansel FeinsteinBishop, Joy Entered By: Hansel FeinsteinBishop, Joy on 06/23/2021 11:05:58 [name] [name], [name] [name] ([id number]) -------------------------------------------------------------------------------- Wound Assessment Details Patient Name: [name] [name], [name] [name] Date of Service: 06/23/2021 10:30 AM Medical Record Number: [id number] Patient Account Number: [id number] Date of Birth/Sex: 08/01/1918 (87 y.o. F) Treating RN: Hansel FeinsteinBishop, Joy Primary Care Quitman Norberto: Einar CrowAnderson, Marshall Other Clinician: Referring Sitara Cashwell: Einar CrowAnderson, Marshall Treating Iyanah Demont/Extender: Tilda FrancoHoffman, Jessica Weeks in Treatment: 11 Wound Status Wound Number: 1 Primary Etiology: Pressure Ulcer Wound Location: Left Calcaneus Wound Status: Open Wounding Event: Gradually Appeared Comorbid Chronic Obstructive Pulmonary Disease (COPD), History: Dementia Date Acquired: 01/12/2021 Weeks Of Treatment: 11 Clustered Wound: No Photos Wound Measurements Length: (cm) 0.2 % Redu Width: (cm) 0.3 % Redu Depth: (cm) 0.3 Epithe Area: (cm) 0.047 Tunne Volume: (cm) 0.014 Under Sta End Max ction in Area: 93.8% ction in Volume: 93.8% lialization: Small (1-33%) ling: No mining: Yes rting Position (o'clock): 10 ing Position (o'clock): 11 imum Distance: (cm) 0.3 Wound Description Classification: Category/Stage III Foul O Exudate Amount: Medium Slough Exudate Type: Serosanguineous Exudate Color: red, brown dor After Cleansing: No Lelon Perla/Fibrino  Yes Wound Bed Granulation Amount: Large (67-100%) Exposed Structure Granulation Quality: Red, Pink, Pale Fascia Exposed: No Necrotic Amount: Small (1-33%) Fat Layer (Subcutaneous Tissue) Exposed: Yes Necrotic Quality: Adherent Slough Tendon Exposed: No Muscle  Exposed: No Joint Exposed: No Bone Exposed: No Treatment Notes Wound #1 (Calcaneus) Wound Laterality: Left Cleanser Soap and Water Bartow, Dorene Grebe ([id number]) Discharge Instruction: Gently cleanse wound with antibacterial soap, rinse and pat dry prior to dressing wounds Peri-Wound Care Topical Primary Dressing Prisma 4.34 (in) Discharge Instruction: Moisten w/normal saline or sterile water; Cover wound as directed. Do not remove from wound bed. Secondary Dressing Foam Dressing, 4x4 (in/in) Discharge Instruction: CUT FOAM DONUT TO BORDER WOUND AND KEEP PRESSURE OFF Mepilex Border Flex, 4x4 (in/in) Discharge Instruction: Apply to LEFT HEEL as directed AND RIGHT HEEL FOR PROTECTION. Do not cut. Secured With Compression Wrap Compression Stockings Add-Ons Electronic Signature(s) Signed: 06/23/2021 1:05:01 PM By: Hansel Feinstein Entered By: Hansel Feinstein on 06/23/2021 11:23:46 [name] [name] ([id number]) -------------------------------------------------------------------------------- Vitals Details Patient Name: [name] [name] Date of Service: 06/23/2021 10:30 AM Medical Record Number: [id number] Patient Account Number: [id number] Date of Birth/Sex: 1918-04-17 (87 y.o. F) Treating RN: Hansel Feinstein Primary Care Hally Colella: Einar Crow Other Clinician: Referring Kayana Thoen: Einar Crow Treating Tarra Pence/Extender: Tilda Franco in Treatment: 11 Vital Signs Time Taken: 10:52 Temperature (F): 98.5 Height (in): 64 Pulse (bpm): 56 Weight (lbs): 100 Respiratory Rate (breaths/min): 16 Body Mass Index (BMI): 17.2 Blood Pressure (mmHg): 114/75 Reference Range: 80 - 120 mg / dl Electronic Signature(s) Signed: 06/23/2021 1:05:01 PM By: Hansel Feinstein Entered ByHansel Feinstein on 06/23/2021 10:55:55

## 2021-06-24 NOTE — Progress Notes (Addendum)
[name] [name], [name] [name] ([id number]) Visit Report for 06/23/2021 Chief Complaint Document Details Patient Name: [name] [name], [name] [name] Date of Service: 06/23/2021 10:30 AM Medical Record Number: [id number] Patient Account Number: [id number] Date of Birth/Sex: [birth year] (87 y.o. F) Treating RN: Huel Coventry Primary Care Provider: Einar Crow Other Clinician: Referring Provider: Einar Crow Treating Provider/Extender: Tilda Franco in Treatment: 11 Information Obtained from: Patient Chief Complaint 04/07/2021; patient is here for wounds on her bilateral heels towards the tips of both Electronic Signature(s) Signed: 06/23/2021 2:20:53 PM By: Geralyn Corwin DO Entered By: Geralyn Corwin on 06/23/2021 13:54:13 [name] [name] ([id number]) -------------------------------------------------------------------------------- HPI Details Patient Name: [name] [name] Date of Service: 06/23/2021 10:30 AM Medical Record Number: [id number] Patient Account Number: [id number] Date of Birth/Sex: [birth year] (87 y.o. F) Treating RN: Huel Coventry Primary Care Provider: Einar Crow Other Clinician: Referring Provider: Einar Crow Treating Provider/Extender: Tilda Franco in Treatment: 11 History of Present Illness HPI Description: She cameADMISSION 04/07/2021 This is a 87 year old woman who is currently a resident at [facility] skilled facility. She is here with her niece who is her primary family member. Prior to December she also was her caregiver. Apparently she was hospitalized from 11/06/2020 through 11/13/2020 with a right femoral neck fracture requiring a hip replacement. She was sent to [facility] for rehabilitation. She developed bilateral heel pressure ulcers over the tip of her heels. She is nonambulatory. Her niece is uncertain about the degree of pressure relief. However they have been using Santyl as the primary dressing for about 2 months. Past medical  history includes COPD now O2 dependent, dementia, hypothyroidism, Alzheimer's disease, history of protein calorie malnutrition although her albumin while in the hospital was 3.4, she has essential tremor although she is on Sinemet and has been for many years, hypothyroidism. She is nonambulatory. ABIs in our clinic were 1.45 on the right and 1.36 on the left 5/11; patient with bilateral pressure ulcers on the tips of her heels. We have been using silver collagen. She is at the facility. In spite of concerns that her dressings do not get changed on schedule and she is not wearing her boots at night, the surface area is actually smaller. 5/25; 2-week follow-up. The right heel continues to do well and is closing in. The left had subcutaneous debris on the surface and although it measures smaller is not anywhere close to healing. 6/8; 2-week follow-up. Both heels continue to better in fact the right heel is for all intents and purposes closed. We have been using silver collagen. She is at [facility] and since the last time we saw her she is now 89!! 6/22; 2-week follow-up. And with no dressings on her heels and to be truthful I am not exactly sure what they are doing in the nursing home if anything to these heels. Nevertheless the area on the right is healed. The area on the left still is open. Thick callus and skin around the edges of the wounds. We are using silver collagen supposedly 7/13; patient presents for 2-week follow-up. She resides in a facility and it is inconsistent whether she is wearing Prevalon boots to help heal the heel wound. She has no complaints today. She has been using collagen to the left heel wound. She denies signs of infection. Electronic Signature(s) Signed: 06/23/2021 2:20:53 PM By: Geralyn Corwin DO Entered By: Geralyn Corwin on 06/23/2021 13:54:29 [name] [name]  ([id number]) -------------------------------------------------------------------------------- Physical Exam Details Patient Name: [name] [name] Date of Service: 06/23/2021 10:30 AM Medical Record Number: [id number] Patient Account Number:  [id number] Date of Birth/Sex: 1918/07/20 (87 y.o. F) Treating RN: Huel Coventry Primary Care Provider: Einar Crow Other Clinician: Referring Provider: Einar Crow Treating Provider/Extender: Tilda Franco in Treatment: 11 Constitutional . Cardiovascular . Psychiatric . Notes Left heel: Open wound with some undermining circumferentially. There is granulation tissue noted. There is hyper keratotic growth to her left earlobe. Discoloration noted. No signs of infection to either site Electronic Signature(s) Signed: 06/23/2021 2:20:53 PM By: Geralyn Corwin DO Entered By: Geralyn Corwin on 06/23/2021 13:55:46 [name] [name] ([id number]) -------------------------------------------------------------------------------- Physician Orders Details Patient Name: [name] [name] Date of Service: 06/23/2021 10:30 AM Medical Record Number: [id number] Patient Account Number: [id number] Date of Birth/Sex: 1918/10/14 (87 y.o. F) Treating RN: Hansel Feinstein Primary Care Provider: Einar Crow Other Clinician: Referring Provider: Einar Crow Treating Provider/Extender: Tilda Franco in Treatment: 82 Verbal / Phone Orders: No Diagnosis Coding Follow-up Appointments o Return Appointment in 2 weeks. Bathing/ Shower/ Hygiene o May shower; gently cleanse wound with antibacterial soap, rinse and pat dry prior to dressing wounds Non-Wound Condition o Additional non-wound orders/instructions: - ***RIGHT HEEL-CONTINUE WEARING PROTECTIVE DRESSING*** ***LEFT EAR-TRIPLE ANTIBIOTIC OINTMENT AND BANDAID, CHANGE DAILY*** Off-Loading o Gel wheelchair cushion - TO OFFLOAD BONY PROMINENCE o Low air-loss mattress (Group 2) - AIR  MATTRESS SUGGESTED FOR PRESSURE BREAKDOWN AND WEIGHT LOSS o Turn and reposition every 2 hours - Keep off-loading (ie: sage boots) booties on feet while in the bed, float heels. Float heels when sitting in wheelchair. IT'S IMPORTANT TO KEEP PRESSURE OFF OF HEELS AND BONY PROMINENCES Wound Treatment Wound #1 - Calcaneus Wound Laterality: Left Cleanser: Soap and Water Every Other Day/30 Days Discharge Instructions: Gently cleanse wound with antibacterial soap, rinse and pat dry prior to dressing wounds Peri-Wound Care: Ortho Felt Adhesive Roll, 0.25x6x2.5 (in/in/yd), white Every Other Day/30 Days Discharge Instructions: cut donut to offload wound on left heel peri wound Primary Dressing: Prisma 4.34 (in) Every Other Day/30 Days Discharge Instructions: Moisten w/normal saline or sterile water; Cover wound as directed. Do not remove from wound bed. Secondary Dressing: Mepilex Border Flex, 4x4 (in/in) Every Other Day/30 Days Discharge Instructions: Apply to LEFT HEEL as directed AND RIGHT HEEL FOR PROTECTION. Do not cut. Electronic Signature(s) Signed: 06/23/2021 2:20:53 PM By: Geralyn Corwin DO Signed: 06/24/2021 4:28:40 PM By: Hansel Feinstein Previous Signature: 06/23/2021 1:05:01 PM Version By: Hansel Feinstein Entered By: Hansel Feinstein on 06/23/2021 13:25:48 [name] [name] ([id number]) -------------------------------------------------------------------------------- Problem List Details Patient Name: [name] [name] Date of Service: 06/23/2021 10:30 AM Medical Record Number: [id number] Patient Account Number: [id number] Date of Birth/Sex: 1918/03/13 (87 y.o. F) Treating RN: Huel Coventry Primary Care Provider: Einar Crow Other Clinician: Referring Provider: Einar Crow Treating Provider/Extender: Tilda Franco in Treatment: 11 Active Problems ICD-10 Encounter Code Description Active Date MDM Diagnosis L89.613 Pressure ulcer of right heel, stage 3 04/07/2021 No Yes L89.623  Pressure ulcer of left heel, stage 3 04/07/2021 No Yes Inactive Problems Resolved Problems Electronic Signature(s) Signed: 06/23/2021 2:20:53 PM By: Geralyn Corwin DO Entered By: Geralyn Corwin on 06/23/2021 13:17:45 [name] [name] ([id number]) -------------------------------------------------------------------------------- Progress Note Details Patient Name: [name] [name] Date of Service: 06/23/2021 10:30 AM Medical Record Number: [id number] Patient Account Number: [id number] Date of Birth/Sex: Feb 13, 1918 (87 y.o. F) Treating RN: Huel Coventry Primary Care Provider: Einar Crow Other Clinician: Referring Provider: Einar Crow Treating Provider/Extender: Tilda Franco in Treatment: 11 Subjective Chief Complaint Information obtained from Patient 04/07/2021; patient is here for wounds on her bilateral heels towards the tips of both History of Present Illness (HPI) She cameADMISSION  04/07/2021 This is a 87 year old woman who is currently a resident at [facility] skilled facility. She is here with her niece who is her primary family member. Prior to December she also was her caregiver. Apparently she was hospitalized from 11/06/2020 through 11/13/2020 with a right femoral neck fracture requiring a hip replacement. She was sent to [facility] for rehabilitation. She developed bilateral heel pressure ulcers over the tip of her heels. She is nonambulatory. Her niece is uncertain about the degree of pressure relief. However they have been using Santyl as the primary dressing for about 2 months. Past medical history includes COPD now O2 dependent, dementia, hypothyroidism, Alzheimer's disease, history of protein calorie malnutrition although her albumin while in the hospital was 3.4, she has essential tremor although she is on Sinemet and has been for many years, hypothyroidism. She is nonambulatory. ABIs in our clinic were 1.45 on the right and 1.36 on the  left 5/11; patient with bilateral pressure ulcers on the tips of her heels. We have been using silver collagen. She is at the facility. In spite of concerns that her dressings do not get changed on schedule and she is not wearing her boots at night, the surface area is actually smaller. 5/25; 2-week follow-up. The right heel continues to do well and is closing in. The left had subcutaneous debris on the surface and although it measures smaller is not anywhere close to healing. 6/8; 2-week follow-up. Both heels continue to better in fact the right heel is for all intents and purposes closed. We have been using silver collagen. She is at [facility] and since the last time we saw her she is now 89!! 6/22; 2-week follow-up. And with no dressings on her heels and to be truthful I am not exactly sure what they are doing in the nursing home if anything to these heels. Nevertheless the area on the right is healed. The area on the left still is open. Thick callus and skin around the edges of the wounds. We are using silver collagen supposedly 7/13; patient presents for 2-week follow-up. She resides in a facility and it is inconsistent whether she is wearing Prevalon boots to help heal the heel wound. She has no complaints today. She has been using collagen to the left heel wound. She denies signs of infection. Patient History Information obtained from Patient. Social History Never smoker, Marital Status - Widowed, Alcohol Use - Never, Drug Use - No History, Caffeine Use - Never. Medical History Eyes Denies history of Cataracts, Glaucoma, Optic Neuritis Ear/Nose/Mouth/Throat Denies history of Chronic sinus problems/congestion, Middle ear problems Hematologic/Lymphatic Denies history of Anemia, Hemophilia, Human Immunodeficiency Virus, Lymphedema, Sickle Cell Disease Respiratory Patient has history of Chronic Obstructive Pulmonary Disease (COPD) Denies history of Aspiration, Asthma, Pneumothorax,  Sleep Apnea, Tuberculosis Cardiovascular Denies history of Angina, Arrhythmia, Congestive Heart Failure, Coronary Artery Disease, Deep Vein Thrombosis, Hypertension, Hypotension, Myocardial Infarction, Peripheral Arterial Disease, Peripheral Venous Disease, Phlebitis, Vasculitis Gastrointestinal Denies history of Cirrhosis , Colitis, Crohn s, Hepatitis A, Hepatitis B, Hepatitis C Endocrine Denies history of Type I Diabetes, Type II Diabetes Genitourinary Denies history of End Stage Renal Disease Immunological Denies history of Lupus Erythematosus, Raynaud s, Scleroderma Integumentary (Skin) Denies history of History of Burn, History of pressure wounds Musculoskeletal Denies history of Gout, Rheumatoid Arthritis, Osteoarthritis, Osteomyelitis Neurologic [name] [name], [name] [name] ([id number]) Patient has history of Dementia Denies history of Neuropathy, Quadriplegia, Paraplegia, Seizure Disorder Oncologic Denies history of Received Chemotherapy, Received Radiation Psychiatric Denies history of Anorexia/bulimia, Confinement  Anxiety Objective Constitutional Vitals Time Taken: 10:52 AM, Height: 64 in, Weight: 100 lbs, BMI: 17.2, Temperature: 98.5 F, Pulse: 56 bpm, Respiratory Rate: 16 breaths/min, Blood Pressure: 114/75 mmHg. General Notes: Left heel: Open wound with some undermining circumferentially. There is granulation tissue noted. There is hyper keratotic growth to her left earlobe. Discoloration noted. No signs of infection to either site Integumentary (Hair, Skin) Wound #1 status is Open. Original cause of wound was Gradually Appeared. The date acquired was: 01/12/2021. The wound has been in treatment 11 weeks. The wound is located on the Left Calcaneus. The wound measures 0.2cm length x 0.3cm width x 0.3cm depth; 0.047cm^2 area and 0.014cm^3 volume. There is Fat Layer (Subcutaneous Tissue) exposed. There is no tunneling noted, however, there is undermining starting at 10:00 and ending at  11:00 with a maximum distance of 0.3cm. There is a medium amount of serosanguineous drainage noted. There is large (67- 100%) red, pink, pale granulation within the wound bed. There is a small (1-33%) amount of necrotic tissue within the wound bed including Adherent Slough. Assessment Active Problems ICD-10 Pressure ulcer of right heel, stage 3 Pressure ulcer of left heel, stage 3 Patient's left heel wound looks stable. I recommended continuing collagen and relieving the pressure off of this area Her niece was concerned about a lesion on her left earlobe. This looks suspicious and I recommended following up with dermatology to have this biopsied/removed. However, she would like to try antibiotic ointment as this was recommended by the previous provider. I said we can try this if she would like. Plan Follow-up Appointments: Return Appointment in 2 weeks. Bathing/ Shower/ Hygiene: May shower; gently cleanse wound with antibacterial soap, rinse and pat dry prior to dressing wounds Non-Wound Condition: Additional non-wound orders/instructions: - ***RIGHT HEEL-CONTINUE WEARING PROTECTIVE DRESSING*** ***LEFT EAR-TRIPLE ANTIBIOTIC OINTMENT AND BANDAID, CHANGE DAILY*** Off-Loading: Gel wheelchair cushion - TO OFFLOAD BONY PROMINENCE Low air-loss mattress (Group 2) - AIR MATTRESS SUGGESTED FOR PRESSURE BREAKDOWN AND WEIGHT LOSS Turn and reposition every 2 hours - Keep off-loading (ie: sage boots) booties on feet while in the bed, float heels. Float heels when sitting in wheelchair. IT'S IMPORTANT TO KEEP PRESSURE OFF OF HEELS AND BONY PROMINENCES WOUND #1: - Calcaneus Wound Laterality: Left [name] [name], [name] [name] ([id number]) Cleanser: Soap and Water Every Other Day/30 Days Discharge Instructions: Gently cleanse wound with antibacterial soap, rinse and pat dry prior to dressing wounds Peri-Wound Care: Ortho Felt Adhesive Roll, 0.25x6x2.5 (in/in/yd), white Every Other Day/30 Days Discharge Instructions:  cut donut to offload wound on left heel peri wound Primary Dressing: Prisma 4.34 (in) Every Other Day/30 Days Discharge Instructions: Moisten w/normal saline or sterile water; Cover wound as directed. Do not remove from wound bed. Secondary Dressing: Mepilex Border Flex, 4x4 (in/in) Every Other Day/30 Days Discharge Instructions: Apply to LEFT HEEL as directed AND RIGHT HEEL FOR PROTECTION. Do not cut. 1. Collagen to the left heel wound 2. Antibiotic ointment to the left ear Electronic Signature(s) Signed: 06/23/2021 2:20:53 PM By: Geralyn CorwinHoffman, Tamera Pingley DO Entered By: Geralyn CorwinHoffman, Emelin Dascenzo on 06/23/2021 13:58:15 [name] [name], [name] [name] ([id number]) -------------------------------------------------------------------------------- ROS/PFSH Details Patient Name: [name] [name], [name] [name] Date of Service: 06/23/2021 10:30 AM Medical Record Number: [id number] Patient Account Number: [id number] Date of Birth/Sex: 09/19/1918 (87 y.o. F) Treating RN: Huel CoventryWoody, Kim Primary Care Provider: Einar CrowAnderson, Marshall Other Clinician: Referring Provider: Einar CrowAnderson, Marshall Treating Provider/Extender: Tilda FrancoHoffman, Kyre Jeffries Weeks in Treatment: 11 Information Obtained From Patient Eyes Medical History: Negative for: Cataracts; Glaucoma; Optic Neuritis Ear/Nose/Mouth/Throat Medical History: Negative for: Chronic sinus problems/congestion; Middle ear  problems Hematologic/Lymphatic Medical History: Negative for: Anemia; Hemophilia; Human Immunodeficiency Virus; Lymphedema; Sickle Cell Disease Respiratory Medical History: Positive for: Chronic Obstructive Pulmonary Disease (COPD) Negative for: Aspiration; Asthma; Pneumothorax; Sleep Apnea; Tuberculosis Cardiovascular Medical History: Negative for: Angina; Arrhythmia; Congestive Heart Failure; Coronary Artery Disease; Deep Vein Thrombosis; Hypertension; Hypotension; Myocardial Infarction; Peripheral Arterial Disease; Peripheral Venous Disease; Phlebitis; Vasculitis Gastrointestinal Medical  History: Negative for: Cirrhosis ; Colitis; Crohnos; Hepatitis A; Hepatitis B; Hepatitis C Endocrine Medical History: Negative for: Type I Diabetes; Type II Diabetes Genitourinary Medical History: Negative for: End Stage Renal Disease Immunological Medical History: Negative for: Lupus Erythematosus; Raynaudos; Scleroderma Integumentary (Skin) Medical History: Negative for: History of Burn; History of pressure wounds Musculoskeletal [name] [name], [name] [name] ([id number]) Medical History: Negative for: Gout; Rheumatoid Arthritis; Osteoarthritis; Osteomyelitis Neurologic Medical History: Positive for: Dementia Negative for: Neuropathy; Quadriplegia; Paraplegia; Seizure Disorder Oncologic Medical History: Negative for: Received Chemotherapy; Received Radiation Psychiatric Medical History: Negative for: Anorexia/bulimia; Confinement Anxiety Immunizations Pneumococcal Vaccine: Received Pneumococcal Vaccination: No Implantable Devices None Family and Social History Never smoker; Marital Status - Widowed; Alcohol Use: Never; Drug Use: No History; Caffeine Use: Never; Financial Concerns: No; Food, Clothing or Shelter Needs: No; Support System Lacking: No; Transportation Concerns: No Electronic Signature(s) Signed: 06/23/2021 2:20:53 PM By: Geralyn Corwin DO Signed: 06/24/2021 4:19:16 PM By: Elliot Gurney, BSN, RN, CWS, Kim RN, BSN Entered By: Geralyn Corwin on 06/23/2021 13:54:36 [name] [name] ([id number]) -------------------------------------------------------------------------------- SuperBill Details Patient Name: [name] [name] Date of Service: 06/23/2021 Medical Record Number: [id number] Patient Account Number: [id number] Date of Birth/Sex: May 13, 1918 (87 y.o. F) Treating RN: Hansel Feinstein Primary Care Provider: Einar Crow Other Clinician: Referring Provider: Einar Crow Treating Provider/Extender: Tilda Franco in Treatment: 11 Diagnosis Coding ICD-10 Codes Code  Description [phone] Pressure ulcer of right heel, stage 3 L89.623 Pressure ulcer of left heel, stage 3 Facility Procedures CPT4 Code: [id number] Description: [phone] - WOUND CARE VISIT-LEV 2 EST PT Modifier: Quantity: 1 Physician Procedures CPT4 Code: [id number] Description: 99213 - WC PHYS LEVEL 3 - EST PT Modifier: Quantity: 1 CPT4 Code: Description: ICD-10 Diagnosis Description L89.613 Pressure ulcer of right heel, stage 3 L89.623 Pressure ulcer of left heel, stage 3 Modifier: Quantity: Electronic Signature(s) Signed: 06/23/2021 2:20:53 PM By: Geralyn Corwin DO Previous Signature: 06/23/2021 1:05:01 PM Version By: Hansel Feinstein Entered By: Geralyn Corwin on 06/23/2021 13:59:38

## 2021-07-07 ENCOUNTER — Encounter (HOSPITAL_BASED_OUTPATIENT_CLINIC_OR_DEPARTMENT_OTHER): Payer: Medicare Other | Admitting: Internal Medicine

## 2021-07-07 ENCOUNTER — Other Ambulatory Visit: Payer: Self-pay

## 2021-07-07 DIAGNOSIS — L89623 Pressure ulcer of left heel, stage 3: Secondary | ICD-10-CM | POA: Diagnosis not present

## 2021-07-07 NOTE — Progress Notes (Addendum)
[name] [name], Pepper ([id number]) Visit Report for 07/07/2021 Arrival Information Details Patient Name: [name] [name], [name] [name] Date of Service: 07/07/2021 10:30 AM Medical Record Number: [id number] Patient Account Number: [id number] Date of Birth/Sex: 02/19/1918 (87 y.o. F) Treating RN: [name] [name] Primary Care [name] [name]: [name] [name] Other Clinician: Referring [name] [name]: [name] [name] Treating Milinda Sweeney/Extender: [name] [name] in Treatment: 13 Visit Information History Since Last Visit Added or deleted any medications: No Patient Arrived: Wheel Chair Had a fall or experienced change in No Arrival Time: 11:00 activities of daily living that may affect Accompanied By: POA risk of falls: Transfer Assistance: Manual Hospitalized since last visit: No Patient Identification Verified: Yes Has Dressing in Place as Prescribed: Yes Secondary Verification Process Completed: Yes Pain Present Now: No Patient Requires Transmission-Based No Precautions: Patient Has Alerts: Yes Patient Alerts: NOT diabetic 2 person [facility] Electronic Signature(s) Signed: 07/07/2021 3:34:37 PM By: [name] [name] Previous Signature: 07/07/2021 1:09:54 PM Version By: [name] [name] Entered By: [name] [name] on 07/07/2021 15:34:37 [name] [name], [name] [name] ([id number]) -------------------------------------------------------------------------------- Clinic Level of Care Assessment Details Patient Name: [name] [name], [name] [name] Date of Service: 07/07/2021 10:30 AM Medical Record Number: [id number] Patient Account Number: [id number] Date of Birth/Sex: 08/09/1918 (87 y.o. F) Treating RN: [name] [name] Primary Care [name] [name]: [name] [name] Other Clinician: Referring Anden Bartolo: [name] [name] Treating [name] [name]: [name] [name] in Treatment: 13 Clinic Level of Care Assessment Items TOOL 4 Quantity Score []  - Use when only an EandM is performed on FOLLOW-UP visit 0 ASSESSMENTS - Nursing  Assessment / Reassessment []  - Reassessment of Co-morbidities (includes updates in patient status) 0 []  - 0 Reassessment of Adherence to Treatment Plan ASSESSMENTS - Wound and Skin Assessment / Reassessment X - Simple Wound Assessment / Reassessment - one wound 1 5 []  - 0 Complex Wound Assessment / Reassessment - multiple wounds X- 1 10 Dermatologic / Skin Assessment (not related to wound area) ASSESSMENTS - Focused Assessment []  - Circumferential Edema Measurements - multi extremities 0 []  - 0 Nutritional Assessment / Counseling / Intervention []  - 0 Lower Extremity Assessment (monofilament, tuning fork, pulses) []  - 0 Peripheral Arterial Disease Assessment (using hand held doppler) ASSESSMENTS - Ostomy and/or Continence Assessment and Care []  - Incontinence Assessment and Management 0 []  - 0 Ostomy Care Assessment and Management (repouching, etc.) PROCESS - Coordination of Care X - Simple Patient / Family Education for ongoing care 1 15 []  - 0 Complex (extensive) Patient / Family Education for ongoing care []  - 0 Staff obtains ChiropractorConsents, Records, Test Results / Process Orders X- 1 10 Staff telephones HHA, Nursing Homes / Clarify orders / etc []  - 0 Routine Transfer to another Facility (non-emergent condition) []  - 0 Routine Hospital Admission (non-emergent condition) []  - 0 New Admissions / Manufacturing engine[employer]surance Authorizations / Ordering NPWT, Apligraf, etc. []  - 0 Emergency Hospital Admission (emergent condition) X- 1 10 Simple Discharge Coordination []  - 0 Complex (extensive) Discharge Coordination PROCESS - Special Needs []  - Pediatric / Minor Patient Management 0 []  - 0 Isolation Patient Management []  - 0 Hearing / Language / Visual special needs []  - 0 Assessment of Community assistance (transportation, D/C planning, etc.) []  - 0 Additional assistance / Altered mentation []  - 0 Support Surface(s) Assessment (bed, cushion, seat, etc.) INTERVENTIONS - Wound Cleansing /  Measurement [name], [name] ([id number]) X- 1 5 Simple Wound Cleansing - one wound []  - 0 Complex Wound Cleansing - multiple wounds []  - 0 Wound Imaging (photographs - any number of wounds) []  - 0 Wound Tracing (instead of photographs)  X- 1 5 Simple Wound Measurement - one wound []  - 0 Complex Wound Measurement - multiple wounds INTERVENTIONS - Wound Dressings X - Small Wound Dressing one or multiple wounds 1 10 []  - 0 Medium Wound Dressing one or multiple wounds []  - 0 Large Wound Dressing one or multiple wounds []  - 0 Application of Medications - topical []  - 0 Application of Medications - injection INTERVENTIONS - Miscellaneous []  - External ear exam 0 []  - 0 Specimen Collection (cultures, biopsies, blood, body fluids, etc.) []  - 0 Specimen(s) / Culture(s) sent or taken to Lab for analysis X- 1 10 Patient Transfer (multiple staff / / Similar devices) []  - 0 Simple Staple / Suture removal (25 or less) []  - 0 Complex Staple / Suture removal (26 or more) []  - 0 Hypo / Hyperglycemic Management (close monitor of Blood Glucose) []  - 0 Ankle / Brachial Index (ABI) - do not check if billed separately X- 1 5 Vital Signs Has the patient been seen at the hospital within the last three years: Yes Total Score: 85 Level Of Care: New/Established - Level 3 Electronic Signature(s) Signed: 07/07/2021 1:09:54 PM By: Entered By: on 07/07/2021 11:38:26 ( ) -------------------------------------------------------------------------------- Encounter Discharge Information Details Patient Name: Date of Service: 07/07/2021 10:30 AM Medical Record Number: Patient Account Number: Date of Birth/Sex: Jul 01, 1918 (87 y.o. F) Treating RN: Primary Care Azlynn Mitnick: 07/09/2021 Other Clinician: Referring Doxie Augenstein: [name] Feinstein Treating Laakea Pereira/Extender: [name] Feinstein  in Treatment: 13 Encounter Discharge Information Items Discharge Condition: Stable Ambulatory Status: Ambulatory Discharge Destination: Home Transportation: Private Auto Accompanied By: wife Schedule Follow-up Appointment: Yes Clinical Summary of Care: Electronic Signature(s) Signed: 07/07/2021 1:09:54 PM By: [name] [name] Entered By: [id number] on 07/07/2021 11:50:04 07/09/2021 ([id number]) -------------------------------------------------------------------------------- Lower Extremity Assessment Details Patient Name: [id number] Date of Service: 07/07/2021 10:30 AM Medical Record Number: [name] Feinstein Patient Account Number: [name] Crow Date of Birth/Sex: 11-Jan-1918 (87 y.o. F) Treating RN: [name] Franco Primary Care Milanya Sunderland: 07/09/2021 Other Clinician: Referring Demarius Archila: [name] Feinstein Treating Aireonna Bauer/Extender: [name] Feinstein in Treatment: 13 Edema Assessment Assessed: 07/09/2021: Yes] [name] [name]: Yes] Edema: [Left: No] [Right: No] Vascular Assessment Pulses: Dorsalis Pedis Palpable: [Left:Yes] [Right:Yes] Electronic Signature(s) Signed: 07/07/2021 1:09:54 PM By: [name] [name] Entered By: 07/09/2021 on 07/07/2021 11:15:54 [id number] (07/16/1918) -------------------------------------------------------------------------------- Multi Wound Chart Details Patient Name: [name] Feinstein Date of Service: 07/07/2021 10:30 AM Medical Record Number: [name] Crow Patient Account Number: [name] Franco Date of Birth/Sex: 1918-10-14 (87 y.o. F) Treating RN: Franne Forts Primary Care Derl Abalos: 07/09/2021 Other Clinician: Referring Anayely Constantine: [name] Feinstein Treating Danielly Ackerley/Extender: [name] Feinstein in Treatment: 13 Vital Signs Height(in): 64 Pulse(bpm): 60 Weight(lbs): 100 Blood Pressure(mmHg): 125/78 Body Mass Index(BMI): 17 Temperature(F): 96.1 Respiratory Rate(breaths/min): 16 Photos: [N/A:N/A] Wound Location: Left Calcaneus N/A N/A Wounding  Event: Gradually Appeared N/A N/A Primary Etiology: Pressure Ulcer N/A N/A Comorbid History: Chronic Obstructive Pulmonary N/A N/A Disease (COPD), Dementia Date Acquired: 01/12/2021 N/A N/A [name] of Treatment: 13 N/A N/A Wound Status: Open N/A N/A Measurements L x W x D (cm) 1x0.7x0.3 N/A N/A Area (cm) : 0.55 N/A N/A Volume (cm) : 0.165 N/A N/A % Reduction in Area: 27.10% N/A N/A % Reduction in Volume: 27.00% N/A N/A Classification: Category/Stage III N/A N/A Exudate Amount: Medium N/A N/A Exudate Type: Serosanguineous N/A N/A Exudate Color: red, brown N/A N/A Granulation Amount: Medium (34-66%) N/A N/A Granulation Quality: Red, Pink, Pale N/A N/A Necrotic Amount: Medium (34-66%) N/A N/A  Exposed Structures: Fat Layer (Subcutaneous Tissue): N/A N/A Yes Fascia: No Tendon: No Muscle: No Joint: No Bone: No Epithelialization: None N/A N/A Treatment Notes Wound #1 (Calcaneus) Wound Laterality: Left Cleanser Soap and Water Discharge Instruction: Gently cleanse wound with antibacterial soap, rinse and pat dry prior to dressing wounds Peri-Wound Care Topical JUANETTE, URIZAR ([id number]) Primary Dressing Silvercel Small 2x2 (in/in) Discharge Instruction: Apply Silvercel Small 2x2 (in/in) as instructed Secondary Dressing Mepilex Border Flex, 4x4 (in/in) Discharge Instruction: Apply to LEFT HEEL as directed AND RIGHT HEEL FOR PROTECTION. Do not cut. Secured With Compression Wrap Compression Stockings Add-Ons Electronic Signature(s) Signed: 07/07/2021 11:53:19 AM By: Geralyn Corwin DO Entered By: Geralyn Corwin on 07/07/2021 11:46:47 [name] [name] ([id number]) -------------------------------------------------------------------------------- Multi-Disciplinary Care Plan Details Patient Name: [name] [name] Date of Service: 07/07/2021 10:30 AM Medical Record Number: [id number] Patient Account Number: [id number] Date of Birth/Sex: 09-04-1918 (87 y.o. F) Treating RN: [name] Feinstein Primary Care Sritha Chauncey: [name] Crow Other Clinician: Referring Shakaya Bhullar: [name] Crow Treating Angle Karel/Extender: [name] Franco in Treatment: 13 Active Inactive Electronic Signature(s) Signed: 07/07/2021 1:09:54 PM By: [name] Feinstein Entered By: [name] Feinstein on 07/07/2021 11:16:33 [name] [name] ([id number]) -------------------------------------------------------------------------------- Non-Wound Condition Assessment Details Patient Name: [name] [name] Date of Service: 07/07/2021 10:30 AM Medical Record Number: [id number] Patient Account Number: [id number] Date of Birth/Sex: 1918-04-10 (87 y.o. F) Treating RN: [name] Feinstein Primary Care Lorre Opdahl: [name] Crow Other Clinician: Referring Camyla Camposano: [name] Crow Treating Azariya Freeman/Extender: [name] Franco in Treatment: 13 Non-Wound Condition: Condition: Suspected Deep Tissue Injury Location: Foot Side: Left Photos Electronic Signature(s) Signed: 07/07/2021 1:09:54 PM By: [name] Feinstein Entered By: [name] Feinstein on 07/07/2021 11:15:17 [name] [name] ([id number]) -------------------------------------------------------------------------------- Pain Assessment Details Patient Name: [name] [name] Date of Service: 07/07/2021 10:30 AM Medical Record Number: [id number] Patient Account Number: [id number] Date of Birth/Sex: 1918/09/21 (87 y.o. F) Treating RN: [name] Feinstein Primary Care Encarnacion Bole: [name] Crow Other Clinician: Referring Tyress Loden: [name] Crow Treating Vihana Kydd/Extender: [name] Franco in Treatment: 13 Active Problems Location of Pain Severity and Description of Pain Patient Has Paino No Site Locations Rate the pain. Current Pain Level: 0 Pain Management and Medication Current Pain Management: Electronic Signature(s) Signed: 07/07/2021 1:09:54 PM By: [name] Feinstein Entered By: [name] Feinstein on 07/07/2021 11:10:58 [name] [name]  ([id number]) -------------------------------------------------------------------------------- Patient/Caregiver Education Details Patient Name: [name] [name] Date of Service: 07/07/2021 10:30 AM Medical Record Number: [id number] Patient Account Number: [id number] Date of Birth/Gender: [birth year] (87 y.o. F) Treating RN: [name] Feinstein Primary Care Physician: [name] Crow Other Clinician: Referring Physician: Einar Crow Treating Physician/Extender: [name] Franco in Treatment: 13 Education Assessment Education Provided To: Patient and Caregiver Education Topics Provided Basic Hygiene: Offloading: Wound/Skin Impairment: Psychologist, prison and probation services) Signed: 07/07/2021 1:09:54 PM By: [name] Feinstein Entered By: [name] Feinstein on 07/07/2021 11:19:23 [name] [name] ([id number]) -------------------------------------------------------------------------------- Wound Assessment Details Patient Name: [name] [name] Date of Service: 07/07/2021 10:30 AM Medical Record Number: [id number] Patient Account Number: [id number] Date of Birth/Sex: 05/07/1918 (87 y.o. F) Treating RN: [name] Feinstein Primary Care Virna Livengood: [name] Crow Other Clinician: Referring Shaquile Lutze: [name] Crow Treating Anselmo Reihl/Extender: [name] Franco in Treatment: 13 Wound Status Wound Number: 1 Primary Etiology: Pressure Ulcer Wound Location: Left Calcaneus Wound Status: Open Wounding Event: Gradually Appeared Comorbid Chronic Obstructive Pulmonary Disease (COPD), History: Dementia Date Acquired: 01/12/2021 [name] Of Treatment: 13 Clustered Wound: No Photos Wound Measurements Length: (cm) 1 Width: (cm) 0.7 Depth: (cm) 0.3 Area: (cm) 0.55 Volume: (cm) 0.165 % Reduction in Area: 27.1% % Reduction in Volume: 27% Epithelialization: None Tunneling: No Undermining: No Wound Description Classification: Category/Stage III Exudate  Amount: Medium Exudate Type: Serosanguineous Exudate Color: red,  brown Foul Odor After Cleansing: No Slough/Fibrino Yes Wound Bed Granulation Amount: Medium (34-66%) Exposed Structure Granulation Quality: Red, Pink, Pale Fascia Exposed: No Necrotic Amount: Medium (34-66%) Fat Layer (Subcutaneous Tissue) Exposed: Yes Necrotic Quality: Adherent Slough Tendon Exposed: No Muscle Exposed: No Joint Exposed: No Bone Exposed: No Treatment Notes Wound #1 (Calcaneus) Wound Laterality: Left Cleanser Soap and Water Discharge Instruction: Gently cleanse wound with antibacterial soap, rinse and pat dry prior to dressing wounds Peri-Wound Care THATIANA, RENBARGER ([id number]) Topical Primary Dressing Silvercel Small 2x2 (in/in) Discharge Instruction: Apply Silvercel Small 2x2 (in/in) as instructed Secondary Dressing Mepilex Border Flex, 4x4 (in/in) Discharge Instruction: Apply to LEFT HEEL as directed AND RIGHT HEEL FOR PROTECTION. Do not cut. Secured With Compression Wrap Compression Stockings Add-Ons Electronic Signature(s) Signed: 07/07/2021 1:09:54 PM By: [name] Feinstein Entered By: [name] Feinstein on 07/07/2021 11:14:26 [name] [name] ([id number]) -------------------------------------------------------------------------------- Vitals Details Patient Name: [name] [name] Date of Service: 07/07/2021 10:30 AM Medical Record Number: [id number] Patient Account Number: [id number] Date of Birth/Sex: 11-Jun-1918 (87 y.o. F) Treating RN: [name] Feinstein Primary Care Saron Tweed: [name] Crow Other Clinician: Referring Saraann Enneking: [name] Crow Treating Gailen Venne/Extender: [name] Franco in Treatment: 13 Vital Signs Time Taken: 11:05 Temperature (F): 96.1 Height (in): 64 Pulse (bpm): 60 Weight (lbs): 100 Respiratory Rate (breaths/min): 16 Body Mass Index (BMI): 17.2 Blood Pressure (mmHg): 125/78 Reference Range: 80 - 120 mg / dl Electronic Signature(s) Signed: 07/07/2021 1:09:54 PM By: [name] Feinstein Entered ByHansel Feinstein on 07/07/2021 11:10:43

## 2021-07-07 NOTE — Progress Notes (Signed)
[name] [name] ([id number]) Visit Report for 07/07/2021 Chief Complaint Document Details Patient Name: [name] [name] Date of Service: 07/07/2021 10:30 AM Medical Record Number: [id number] Patient Account Number: [id number] Date of Birth/Sex: 14-Oct-1918 (87 y.o. F) Treating RN: Hansel Feinstein Primary Care Provider: Einar Crow Other Clinician: Referring Provider: Einar Crow Treating Provider/Extender: Tilda Franco in Treatment: 13 Information Obtained from: Patient Chief Complaint 04/07/2021; patient is here for wounds on her bilateral heels towards the tips of both Electronic Signature(s) Signed: 07/07/2021 11:53:19 AM By: Geralyn Corwin DO Entered By: Geralyn Corwin on 07/07/2021 11:46:57 [name] [name] ([id number]) -------------------------------------------------------------------------------- HPI Details Patient Name: [name] [name] Date of Service: 07/07/2021 10:30 AM Medical Record Number: [id number] Patient Account Number: [id number] Date of Birth/Sex: [birth year] (87 y.o. F) Treating RN: Hansel Feinstein Primary Care Provider: Einar Crow Other Clinician: Referring Provider: Einar Crow Treating Provider/Extender: Tilda Franco in Treatment: 13 History of Present Illness HPI Description: She cameADMISSION 04/07/2021 This is a 87 year old woman who is currently a resident at [facility] skilled facility. She is here with her niece who is her primary family member. Prior to December she also was her caregiver. Apparently she was hospitalized from 11/06/2020 through 11/13/2020 with a right femoral neck fracture requiring a hip replacement. She was sent to [facility] for rehabilitation. She developed bilateral heel pressure ulcers over the tip of her heels. She is nonambulatory. Her niece is uncertain about the degree of pressure relief. However they have been using Santyl as the primary dressing for about 2 months. Past medical  history includes COPD now O2 dependent, dementia, hypothyroidism, Alzheimer's disease, history of protein calorie malnutrition although her albumin while in the hospital was 3.4, she has essential tremor although she is on Sinemet and has been for many years, hypothyroidism. She is nonambulatory. ABIs in our clinic were 1.45 on the right and 1.36 on the left 5/11; patient with bilateral pressure ulcers on the tips of her heels. We have been using silver collagen. She is at the facility. In spite of concerns that her dressings do not get changed on schedule and she is not wearing her boots at night, the surface area is actually smaller. 5/25; 2-week follow-up. The right heel continues to do well and is closing in. The left had subcutaneous debris on the surface and although it measures smaller is not anywhere close to healing. 6/8; 2-week follow-up. Both heels continue to better in fact the right heel is for all intents and purposes closed. We have been using silver collagen. She is at [facility] and since the last time we saw her she is now 89!! 6/22; 2-week follow-up. And with no dressings on her heels and to be truthful I am not exactly sure what they are doing in the nursing home if anything to these heels. Nevertheless the area on the right is healed. The area on the left still is open. Thick callus and skin around the edges of the wounds. We are using silver collagen supposedly 7/13; patient presents for 2-week follow-up. She resides in a facility and it is inconsistent whether she is wearing Prevalon boots to help heal the heel wound. She has no complaints today. She has been using collagen to the left heel wound. She denies signs of infection. 7/27; patient presents for 2-week follow-up. Her daughter is present. Patient is residing at [facility] and not satisfied with the care. She is often found without her Prevalon boots on. It is not clear if they are changing the dressing  either. She denies signs of infection. Electronic Signature(s) Signed: 07/07/2021 11:53:19 AM By: Geralyn CorwinHoffman, Austynn Pridmore DO Entered By: Geralyn CorwinHoffman, Aissata Wilmore on 07/07/2021 11:47:47 [name] [name], Kathlean ([id number]) -------------------------------------------------------------------------------- Physical Exam Details Patient Name: [name] [name], [name] [name] Date of Service: 07/07/2021 10:30 AM Medical Record Number: [id number] Patient Account Number: [id number] Date of Birth/Sex: 12/17/1917 (87 y.o. F) Treating RN: Hansel FeinsteinBishop, Joy Primary Care Provider: Einar CrowAnderson, Marshall Other Clinician: Referring Provider: Einar CrowAnderson, Marshall Treating Provider/Extender: Tilda FrancoHoffman, Skyy Mcknight Weeks in Treatment: 13 Constitutional . Psychiatric . Notes Left heel: Open wound with some undermining circumferentially. Maceration noted. There is granulation tissue noted. There is hyper keratotic growth to her left earlobe. Discoloration noted. No signs of infection to either site Electronic Signature(s) Signed: 07/07/2021 11:53:19 AM By: Geralyn CorwinHoffman, Rochell Puett DO Entered By: Geralyn CorwinHoffman, Haadiya Frogge on 07/07/2021 11:48:34 [name] [name], Disha ([id number]) -------------------------------------------------------------------------------- Physician Orders Details Patient Name: [name] [name], [name] [name] Date of Service: 07/07/2021 10:30 AM Medical Record Number: [id number] Patient Account Number: [id number] Date of Birth/Sex: 05/13/1918 (87 y.o. F) Treating RN: Hansel FeinsteinBishop, Joy Primary Care Provider: Einar CrowAnderson, Marshall Other Clinician: Referring Provider: Einar CrowAnderson, Marshall Treating Provider/Extender: Tilda FrancoHoffman, Ziaire Hagos Weeks in Treatment: 6013 Verbal / Phone Orders: No Diagnosis Coding Follow-up Appointments o Return Appointment in 2 weeks. Bathing/ Shower/ Hygiene o May shower; gently cleanse wound with antibacterial soap, rinse and pat dry prior to dressing wounds - make sure dressings are dry and changed after bathing Non-Wound Condition o Additional non-wound  orders/instructions: - ***RIGHT HEEL-CONTINUE WEARING PROTECTIVE DRESSING*** ***If desired schedule DERMATOLOGY FOR LEFT EAR Off-Loading o Gel wheelchair cushion - TO OFFLOAD BONY PROMINENCE o Low air-loss mattress (Group 2) - AIR MATTRESS SUGGESTED FOR PRESSURE BREAKDOWN AND WEIGHT LOSS o Turn and reposition every 2 hours - Keep off-loading (ie: sage boots) PREVALON booties on feet while in the bed, float heels. Float heels when sitting in wheelchair. IT'S IMPORTANT TO KEEP PRESSURE OFF OF HEELS AND BONY PROMINENCES-monitor areas for any further skin breakdown TRY TO AVOID CROSSING LEGS Additional Orders / Instructions o Follow Nutritious Diet and Increase Protein Intake - Continue protein supplements to promote wound healing Medications-Please add to medication list. o Other: - LEFT great toe apply skin prep to areas for protection twice daily-suggest foot cradle or similar to keep sheets off of feet-or keep protective booties on feet when in bed Wound Treatment Wound #1 - Calcaneus Wound Laterality: Left Cleanser: Soap and Water 1 x Per Day/30 Days Discharge Instructions: Gently cleanse wound with antibacterial soap, rinse and pat dry prior to dressing wounds Primary Dressing: Silvercel Small 2x2 (in/in) 1 x Per Day/30 Days Discharge Instructions: Apply Silvercel Small 2x2 (in/in) as instructed Secondary Dressing: Mepilex Border Flex, 4x4 (in/in) 1 x Per Day/30 Days Discharge Instructions: Apply to LEFT HEEL as directed AND RIGHT HEEL FOR PROTECTION. Do not cut. Electronic Signature(s) Signed: 07/07/2021 11:53:19 AM By: Geralyn CorwinHoffman, Hani Patnode DO Signed: 07/07/2021 1:09:54 PM By: Hansel FeinsteinBishop, Joy Entered By: Hansel FeinsteinBishop, Joy on 07/07/2021 11:36:47 [name] [name], Hajar ([id number]) -------------------------------------------------------------------------------- Problem List Details Patient Name: [name] [name], Kimiye Date of Service: 07/07/2021 10:30 AM Medical Record Number: [id number] Patient Account  Number: [id number] Date of Birth/Sex: 05/26/1918 (87 y.o. F) Treating RN: Hansel FeinsteinBishop, Joy Primary Care Provider: Einar CrowAnderson, Marshall Other Clinician: Referring Provider: Einar CrowAnderson, Marshall Treating Provider/Extender: Tilda FrancoHoffman, Emmanuel Gruenhagen Weeks in Treatment: 13 Active Problems ICD-10 Encounter Code Description Active Date MDM Diagnosis L89.613 Pressure ulcer of right heel, stage 3 04/07/2021 No Yes L89.623 Pressure ulcer of left heel, stage 3 04/07/2021 No Yes Inactive Problems Resolved Problems Electronic Signature(s) Signed: 07/07/2021 11:53:19 AM By: Geralyn CorwinHoffman, Kortnee Bas DO Entered By: Geralyn CorwinHoffman, Jamilet Ambroise  on 07/07/2021 11:46:40 NOELLE, SEASE ([id number]) -------------------------------------------------------------------------------- Progress Note Details Patient Name: [name] [name], [name] [name] Date of Service: 07/07/2021 10:30 AM Medical Record Number: [id number] Patient Account Number: [id number] Date of Birth/Sex: March 21, 1918 (87 y.o. F) Treating RN: Hansel Feinstein Primary Care Provider: Einar Crow Other Clinician: Referring Provider: Einar Crow Treating Provider/Extender: Tilda Franco in Treatment: 13 Subjective Chief Complaint Information obtained from Patient 04/07/2021; patient is here for wounds on her bilateral heels towards the tips of both History of Present Illness (HPI) She cameADMISSION 04/07/2021 This is a 87 year old woman who is currently a resident at [facility] skilled facility. She is here with her niece who is her primary family member. Prior to December she also was her caregiver. Apparently she was hospitalized from 11/06/2020 through 11/13/2020 with a right femoral neck fracture requiring a hip replacement. She was sent to [facility] for rehabilitation. She developed bilateral heel pressure ulcers over the tip of her heels. She is nonambulatory. Her niece is uncertain about the degree of pressure relief. However they have been using Santyl as  the primary dressing for about 2 months. Past medical history includes COPD now O2 dependent, dementia, hypothyroidism, Alzheimer's disease, history of protein calorie malnutrition although her albumin while in the hospital was 3.4, she has essential tremor although she is on Sinemet and has been for many years, hypothyroidism. She is nonambulatory. ABIs in our clinic were 1.45 on the right and 1.36 on the left 5/11; patient with bilateral pressure ulcers on the tips of her heels. We have been using silver collagen. She is at the facility. In spite of concerns that her dressings do not get changed on schedule and she is not wearing her boots at night, the surface area is actually smaller. 5/25; 2-week follow-up. The right heel continues to do well and is closing in. The left had subcutaneous debris on the surface and although it measures smaller is not anywhere close to healing. 6/8; 2-week follow-up. Both heels continue to better in fact the right heel is for all intents and purposes closed. We have been using silver collagen. She is at [facility] and since the last time we saw her she is now 89!! 6/22; 2-week follow-up. And with no dressings on her heels and to be truthful I am not exactly sure what they are doing in the nursing home if anything to these heels. Nevertheless the area on the right is healed. The area on the left still is open. Thick callus and skin around the edges of the wounds. We are using silver collagen supposedly 7/13; patient presents for 2-week follow-up. She resides in a facility and it is inconsistent whether she is wearing Prevalon boots to help heal the heel wound. She has no complaints today. She has been using collagen to the left heel wound. She denies signs of infection. 7/27; patient presents for 2-week follow-up. Her daughter is present. Patient is residing at [facility] and not satisfied with the care. She is often found without her Prevalon boots on.  It is not clear if they are changing the dressing either. She denies signs of infection. Patient History Information obtained from Patient. Social History Never smoker, Marital Status - Widowed, Alcohol Use - Never, Drug Use - No History, Caffeine Use - Never. Medical History Eyes Denies history of Cataracts, Glaucoma, Optic Neuritis Ear/Nose/Mouth/Throat Denies history of Chronic sinus problems/congestion, Middle ear problems Hematologic/Lymphatic Denies history of Anemia, Hemophilia, Human Immunodeficiency Virus, Lymphedema, Sickle Cell Disease Respiratory Patient has history of Chronic  Obstructive Pulmonary Disease (COPD) Denies history of Aspiration, Asthma, Pneumothorax, Sleep Apnea, Tuberculosis Cardiovascular Denies history of Angina, Arrhythmia, Congestive Heart Failure, Coronary Artery Disease, Deep Vein Thrombosis, Hypertension, Hypotension, Myocardial Infarction, Peripheral Arterial Disease, Peripheral Venous Disease, Phlebitis, Vasculitis Gastrointestinal Denies history of Cirrhosis , Colitis, Crohn s, Hepatitis A, Hepatitis B, Hepatitis C Endocrine Denies history of Type I Diabetes, Type II Diabetes Genitourinary Denies history of End Stage Renal Disease Immunological Denies history of Lupus Erythematosus, Raynaud s, Scleroderma Integumentary (Skin) Denies history of History of Burn, History of pressure wounds Musculoskeletal KATIE, FARAONE ([id number]) Denies history of Gout, Rheumatoid Arthritis, Osteoarthritis, Osteomyelitis Neurologic Patient has history of Dementia Denies history of Neuropathy, Quadriplegia, Paraplegia, Seizure Disorder Oncologic Denies history of Received Chemotherapy, Received Radiation Psychiatric Denies history of Anorexia/bulimia, Confinement Anxiety Objective Constitutional Vitals Time Taken: 11:05 AM, Height: 64 in, Weight: 100 lbs, BMI: 17.2, Temperature: 96.1 F, Pulse: 60 bpm, Respiratory Rate: 16 breaths/min, Blood Pressure:  125/78 mmHg. General Notes: Left heel: Open wound with some undermining circumferentially. Maceration noted. There is granulation tissue noted. There is hyper keratotic growth to her left earlobe. Discoloration noted. No signs of infection to either site Integumentary (Hair, Skin) Wound #1 status is Open. Original cause of wound was Gradually Appeared. The date acquired was: 01/12/2021. The wound has been in treatment 13 weeks. The wound is located on the Left Calcaneus. The wound measures 1cm length x 0.7cm width x 0.3cm depth; 0.55cm^2 area and 0.165cm^3 volume. There is Fat Layer (Subcutaneous Tissue) exposed. There is no tunneling or undermining noted. There is a medium amount of serosanguineous drainage noted. There is medium (34-66%) red, pink, pale granulation within the wound bed. There is a medium (34-66%) amount of necrotic tissue within the wound bed including Adherent Slough. Assessment Active Problems ICD-10 Pressure ulcer of right heel, stage 3 Pressure ulcer of left heel, stage 3 Patient's wound has increased in size since last clinic visit. Unfortunately the patient is in the bed for over 20 hours a day. She is often found without her Prevalon boots when her daughter visits. There is maceration noted. No signs of infection. I recommended changing the dressing to silver alginate daily. It is also important that she relieve the pressure. I recommended she not cross her legs as she is often doing this as well. It will be important for her to use the Prevalon boots. Her left earlobe has some hyperkeratotic overgrowth. I recommended she follow-up with dermatology and she will think about this for now. Plan Follow-up Appointments: Return Appointment in 2 weeks. Bathing/ Shower/ Hygiene: May shower; gently cleanse wound with antibacterial soap, rinse and pat dry prior to dressing wounds - make sure dressings are dry and changed after bathing Non-Wound Condition: Additional non-wound  orders/instructions: - ***RIGHT HEEL-CONTINUE WEARING PROTECTIVE DRESSING*** ***If desired schedule DERMATOLOGY FOR LEFT EAR Off-Loading: Gel wheelchair cushion - TO OFFLOAD BONY PROMINENCE Low air-loss mattress (Group 2) - AIR MATTRESS SUGGESTED FOR PRESSURE BREAKDOWN AND WEIGHT LOSS Turn and reposition every 2 hours - Keep off-loading (ie: sage boots) PREVALON booties on feet while in the bed, float heels. Float heels when sitting in wheelchair. IT'S IMPORTANT TO KEEP PRESSURE OFF OF HEELS AND BONY PROMINENCES-monitor areas for any further skin breakdown Waddill, Temika ([id number]) TRY TO AVOID CROSSING LEGS Additional Orders / Instructions: Follow Nutritious Diet and Increase Protein Intake - Continue protein supplements to promote wound healing Medications-Please add to medication list.: Other: - LEFT great toe apply skin prep to areas for protection twice daily-suggest  foot cradle or similar to keep sheets off of feet-or keep protective booties on feet when in bed WOUND #1: - Calcaneus Wound Laterality: Left Cleanser: Soap and Water 1 x Per Day/30 Days Discharge Instructions: Gently cleanse wound with antibacterial soap, rinse and pat dry prior to dressing wounds Primary Dressing: Silvercel Small 2x2 (in/in) 1 x Per Day/30 Days Discharge Instructions: Apply Silvercel Small 2x2 (in/in) as instructed Secondary Dressing: Mepilex Border Flex, 4x4 (in/in) 1 x Per Day/30 Days Discharge Instructions: Apply to LEFT HEEL as directed AND RIGHT HEEL FOR PROTECTION. Do not cut. 1. Silver alginate daily 2. Prevalon boots 3. Follow-up in 2 weeks Electronic Signature(s) Signed: 07/07/2021 11:53:19 AM By: Geralyn Corwin DO Entered By: Geralyn Corwin on 07/07/2021 11:52:41 [name] [name] ([id number]) -------------------------------------------------------------------------------- ROS/PFSH Details Patient Name: [name] [name] Date of Service: 07/07/2021 10:30 AM Medical Record Number:  [id number] Patient Account Number: [id number] Date of Birth/Sex: Oct 16, 1918 (87 y.o. F) Treating RN: Hansel Feinstein Primary Care Provider: Einar Crow Other Clinician: Referring Provider: Einar Crow Treating Provider/Extender: Tilda Franco in Treatment: 13 Information Obtained From Patient Eyes Medical History: Negative for: Cataracts; Glaucoma; Optic Neuritis Ear/Nose/Mouth/Throat Medical History: Negative for: Chronic sinus problems/congestion; Middle ear problems Hematologic/Lymphatic Medical History: Negative for: Anemia; Hemophilia; Human Immunodeficiency Virus; Lymphedema; Sickle Cell Disease Respiratory Medical History: Positive for: Chronic Obstructive Pulmonary Disease (COPD) Negative for: Aspiration; Asthma; Pneumothorax; Sleep Apnea; Tuberculosis Cardiovascular Medical History: Negative for: Angina; Arrhythmia; Congestive Heart Failure; Coronary Artery Disease; Deep Vein Thrombosis; Hypertension; Hypotension; Myocardial Infarction; Peripheral Arterial Disease; Peripheral Venous Disease; Phlebitis; Vasculitis Gastrointestinal Medical History: Negative for: Cirrhosis ; Colitis; Crohnos; Hepatitis A; Hepatitis B; Hepatitis C Endocrine Medical History: Negative for: Type I Diabetes; Type II Diabetes Genitourinary Medical History: Negative for: End Stage Renal Disease Immunological Medical History: Negative for: Lupus Erythematosus; Raynaudos; Scleroderma Integumentary (Skin) Medical History: Negative for: History of Burn; History of pressure wounds Musculoskeletal JESSIKAH, DICKER ([id number]) Medical History: Negative for: Gout; Rheumatoid Arthritis; Osteoarthritis; Osteomyelitis Neurologic Medical History: Positive for: Dementia Negative for: Neuropathy; Quadriplegia; Paraplegia; Seizure Disorder Oncologic Medical History: Negative for: Received Chemotherapy; Received Radiation Psychiatric Medical History: Negative for:  Anorexia/bulimia; Confinement Anxiety Immunizations Pneumococcal Vaccine: Received Pneumococcal Vaccination: No Implantable Devices None Family and Social History Never smoker; Marital Status - Widowed; Alcohol Use: Never; Drug Use: No History; Caffeine Use: Never; Financial Concerns: No; Food, Clothing or Shelter Needs: No; Support System Lacking: No; Transportation Concerns: No Electronic Signature(s) Signed: 07/07/2021 11:53:19 AM By: Geralyn Corwin DO Signed: 07/07/2021 1:09:54 PM By: Hansel Feinstein Entered By: Geralyn Corwin on 07/07/2021 11:47:55 [name] [name] ([id number]) -------------------------------------------------------------------------------- SuperBill Details Patient Name: [name] [name] Date of Service: 07/07/2021 Medical Record Number: [id number] Patient Account Number: [id number] Date of Birth/Sex: 1918-09-02 (87 y.o. F) Treating RN: Hansel Feinstein Primary Care Provider: Einar Crow Other Clinician: Referring Provider: Einar Crow Treating Provider/Extender: Tilda Franco in Treatment: 13 Diagnosis Coding ICD-10 Codes Code Description [phone] Pressure ulcer of right heel, stage 3 L89.623 Pressure ulcer of left heel, stage 3 Facility Procedures CPT4 Code: [id number] Description: 99213 - WOUND CARE VISIT-LEV 3 EST PT Modifier: Quantity: 1 Physician Procedures CPT4 Code: [id number] Description: 99213 - WC PHYS LEVEL 3 - EST PT Modifier: Quantity: 1 CPT4 Code: Description: ICD-10 Diagnosis Description L89.623 Pressure ulcer of left heel, stage 3 Modifier: Quantity: Electronic Signature(s) Signed: 07/07/2021 11:53:19 AM By: Geralyn Corwin DO Entered By: Geralyn Corwin on 07/07/2021 11:52:57

## 2021-07-17 IMAGING — CT CT CERVICAL SPINE W/O CM
3 of 4 series · 10 of 33 positions shown, 12 images · non-contrast
Comparison: None available.

CLINICAL DATA: Initial evaluation for acute trauma, fall.

EXAM:
CT CERVICAL SPINE WITHOUT CONTRAST
TECHNIQUE: Multidetector CT imaging of the cervical spine was performed without
intravenous contrast. Multiplanar CT image reconstructions were also
generated.

[Series 6: sagittal bone · sagittal · 0.26mm/px · 5 of 45 slices shown, 6 images]
[im 15/45  bone]
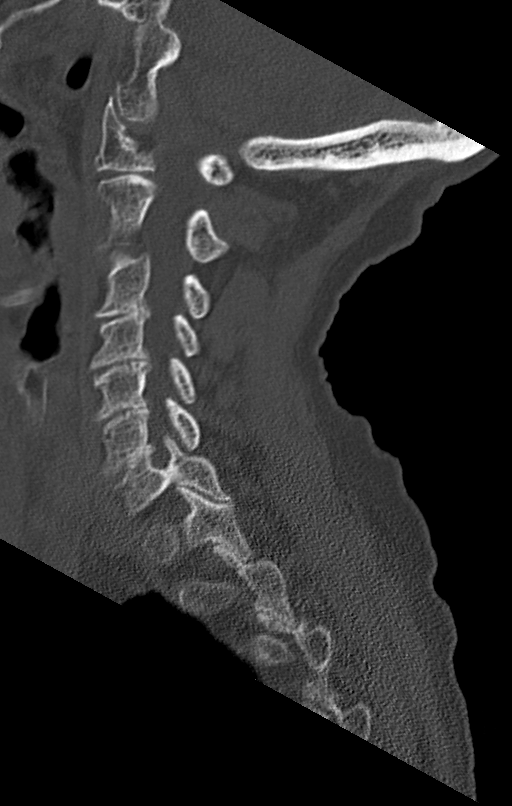
[im 19/45  bone]
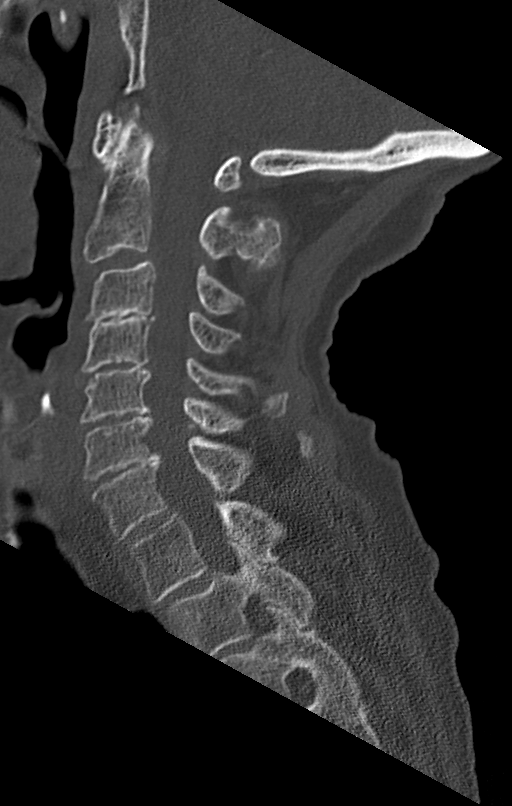
[im 23/45  soft-tissue]
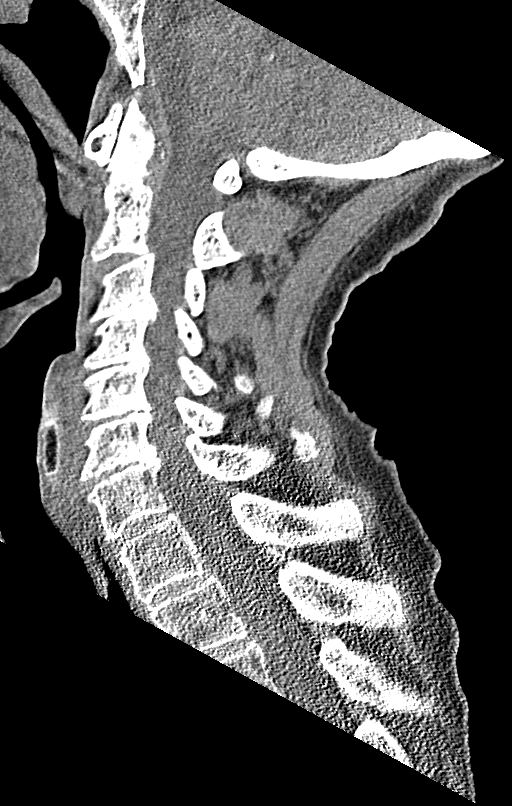
[im 23/45  bone]
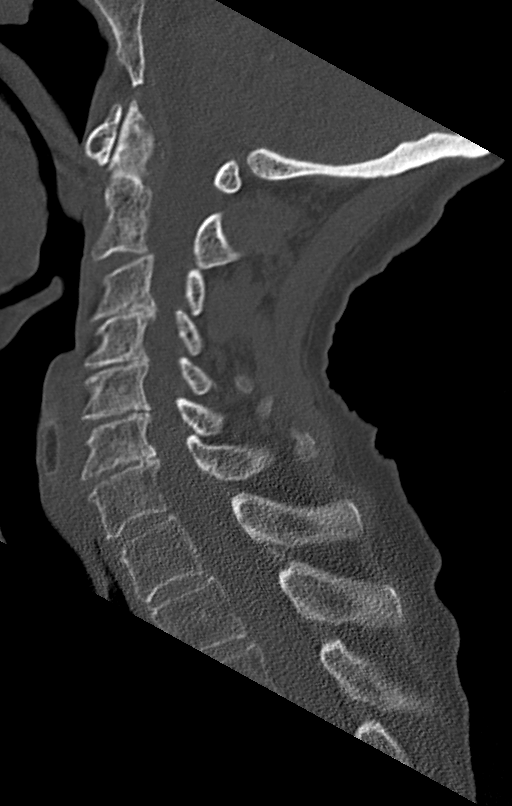
[im 26/45  bone]
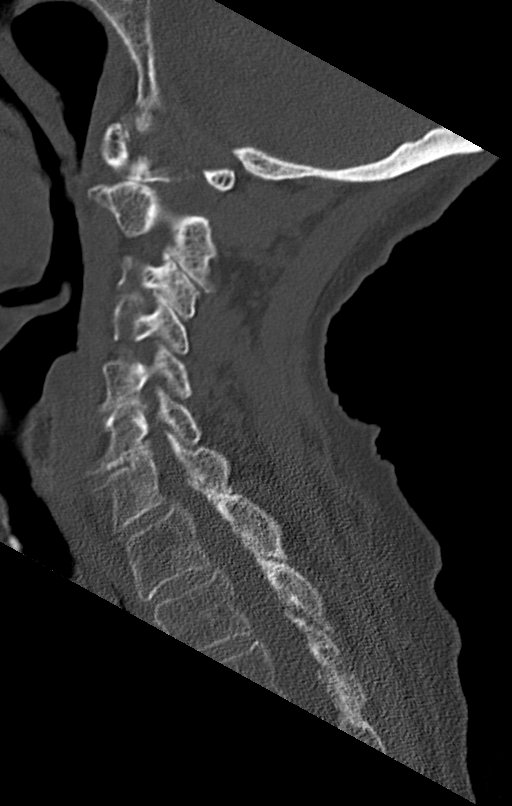
[im 30/45  bone]
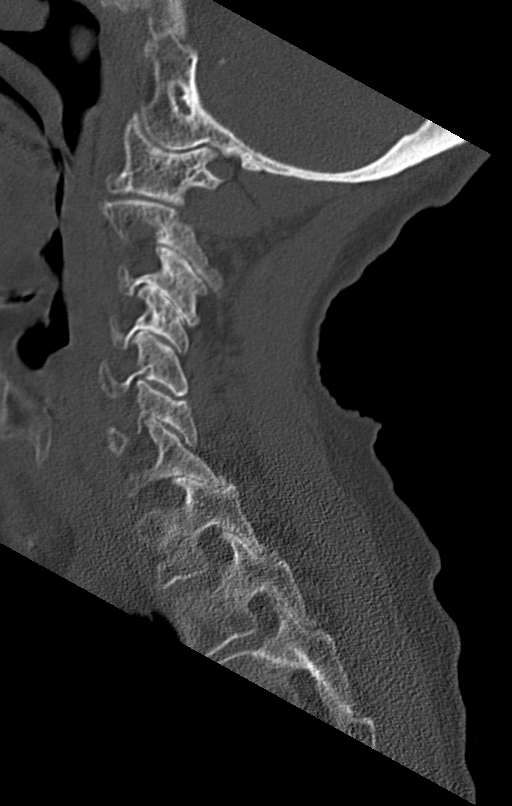

[Series 7: coronal bone · coronal · 0.19mm/px · 3 of 43 slices shown]
[im 9/43  bone]
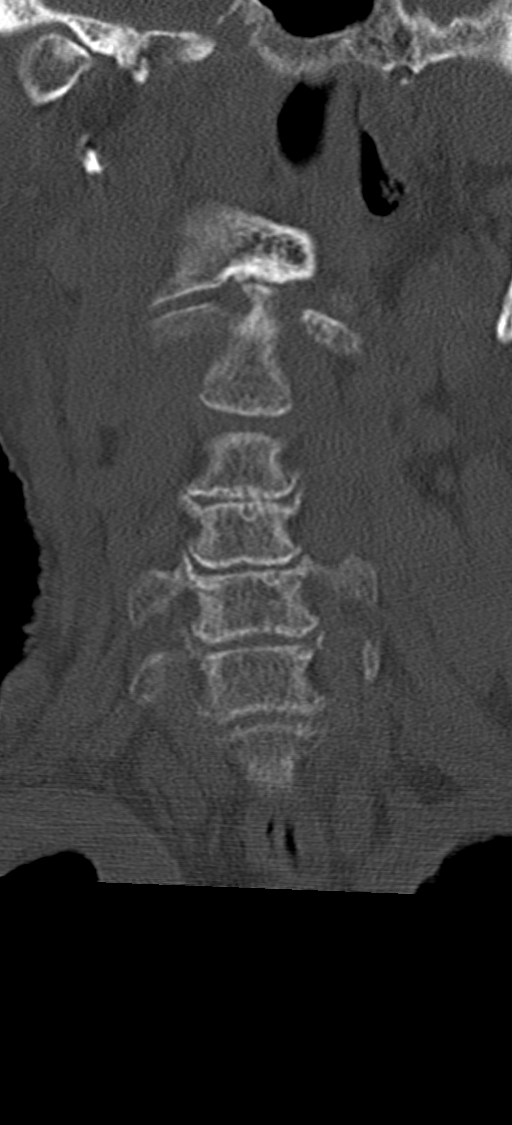
[im 17/43  bone]
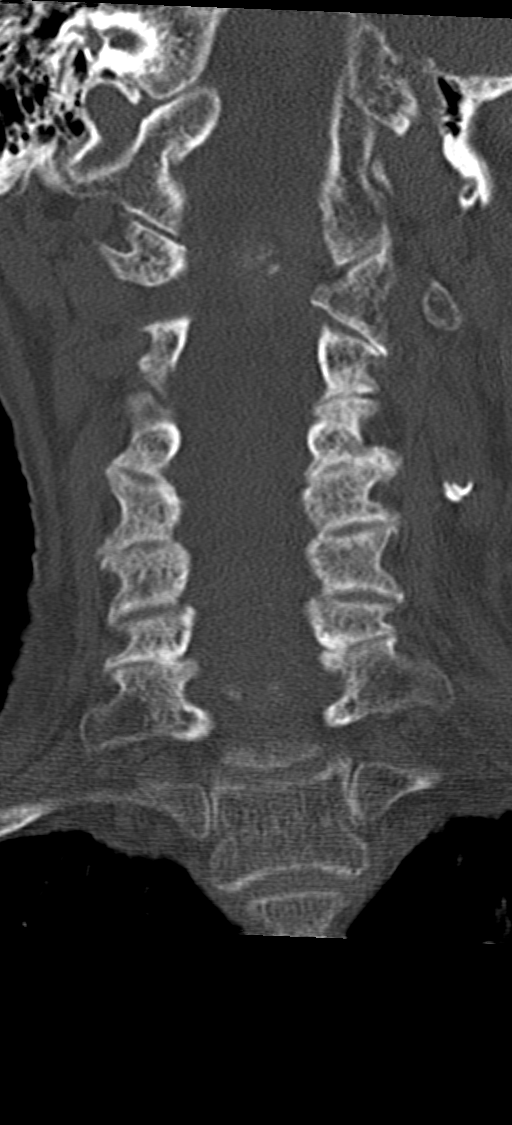
[im 26/43  bone]
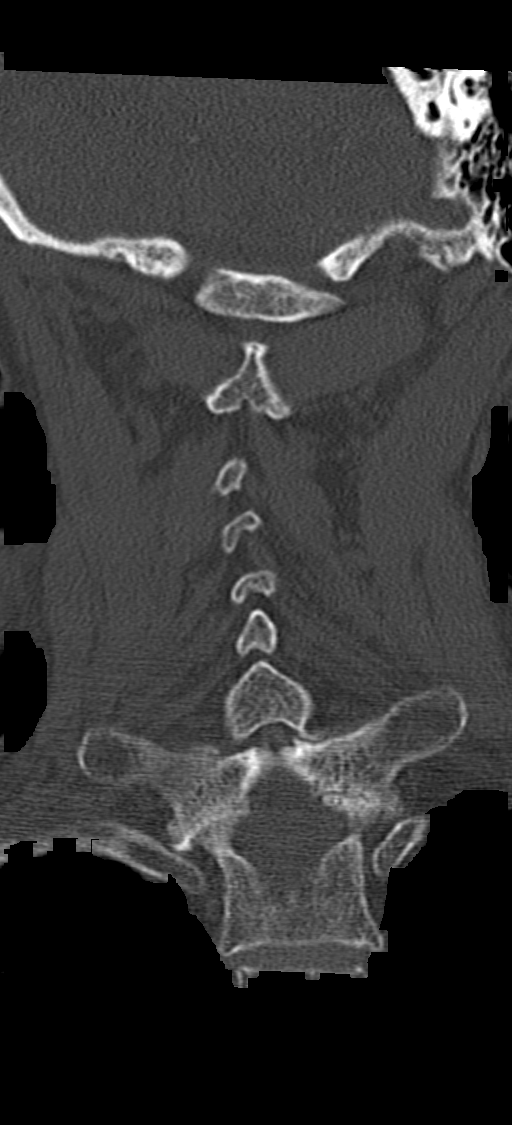

[Series 8: orthogonal bone · axial · 0.21mm/px · z∈[-193,-150]mm · 2 of 76 slices shown, 3 images]
[im 26/76  soft-tissue]
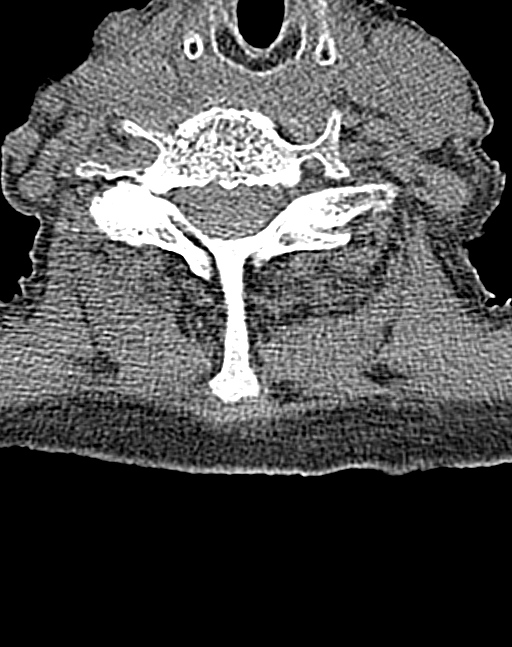
[im 26/76  bone]
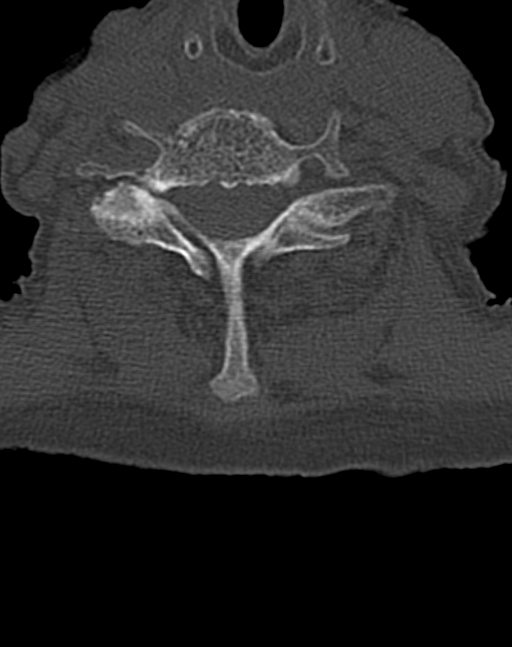
[im 51/76  bone]
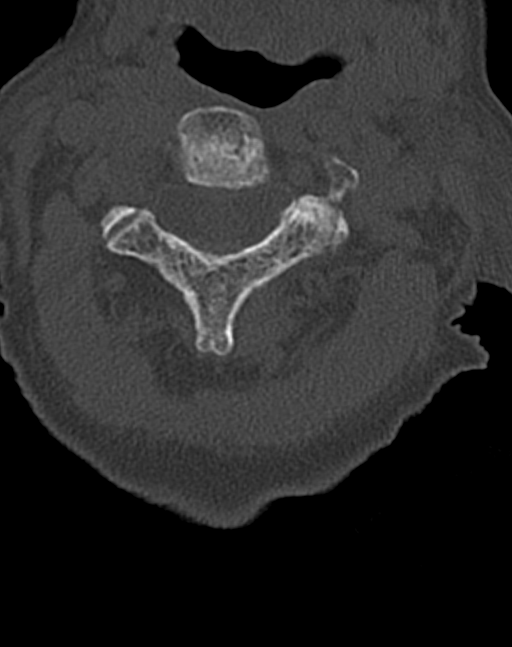

[10 of 33 positions shown; findings below may reference images not displayed]

FINDINGS: Alignment: Straightening of the normal cervical lordosis. Trace
anterolisthesis of C7 on T1-T1 on T2, chronic and facet mediated.

Skull base and vertebrae: Skull base intact. Normal C1-2
articulations are preserved in the dens is intact. Vertebral body
heights maintained. No acute fracture.

Soft tissues and spinal canal: Soft tissues of the neck demonstrate
no acute finding. No abnormal prevertebral edema. Spinal canal
within normal limits. Prominent vascular calcifications about the
carotid bifurcations.

Disc levels: Mild cervical spondylosis present at C3-4 through C6-7.

Upper chest: Visualized upper chest demonstrates no acute finding.
Partially visualized lung apices are clear.

Other: None.
IMPRESSION: 1. No acute traumatic injury within the cervical spine.
2. Mild cervical spondylosis at C3-4 through C6-7.

## 2021-07-20 ENCOUNTER — Encounter: Payer: Medicare Other | Attending: Physician Assistant

## 2021-07-20 ENCOUNTER — Other Ambulatory Visit: Payer: Self-pay

## 2021-07-20 DIAGNOSIS — G309 Alzheimer's disease, unspecified: Secondary | ICD-10-CM | POA: Insufficient documentation

## 2021-07-20 DIAGNOSIS — L89613 Pressure ulcer of right heel, stage 3: Secondary | ICD-10-CM | POA: Diagnosis not present

## 2021-07-20 DIAGNOSIS — J449 Chronic obstructive pulmonary disease, unspecified: Secondary | ICD-10-CM | POA: Diagnosis not present

## 2021-07-20 DIAGNOSIS — E039 Hypothyroidism, unspecified: Secondary | ICD-10-CM | POA: Insufficient documentation

## 2021-07-20 DIAGNOSIS — L89623 Pressure ulcer of left heel, stage 3: Secondary | ICD-10-CM | POA: Diagnosis not present

## 2021-07-20 DIAGNOSIS — F0280 Dementia in other diseases classified elsewhere without behavioral disturbance: Secondary | ICD-10-CM | POA: Diagnosis not present

## 2021-07-20 DIAGNOSIS — Z9981 Dependence on supplemental oxygen: Secondary | ICD-10-CM | POA: Diagnosis not present

## 2021-07-20 NOTE — Progress Notes (Signed)
AMEN, DARGIS ([id number]) Visit Report for 07/20/2021 Arrival Information Details Patient Name: [name] [name], [name] [name] Date of Service: 07/20/2021 10:00 AM Medical Record Number: [id number] Patient Account Number: [id number] Date of Birth/Sex: 1918-10-24 (87 y.o. F) Treating RN: Hansel Feinstein Primary Care Walta [place]: Einar Crow Other Clinician: Referring Nuh Lipton: Einar Crow Treating Gwynne Kemnitz/Extender: Rowan Blase in Treatment: 14 Visit Information History Since Last Visit Added or deleted any medications: No Patient Arrived: Wheel Chair Had a fall or experienced change in No Arrival Time: 10:00 activities of daily living that may affect Accompanied By: POA risk of falls: Transfer Assistance: Manual Hospitalized since last visit: No Patient Identification Verified: Yes Has Dressing in Place as Prescribed: Yes Secondary Verification Process Completed: Yes Pain Present Now: No Patient Requires Transmission-Based No Precautions: Patient Has Alerts: Yes Patient Alerts: NOT diabetic 2 person [facility] Electronic Signature(s) Signed: 07/20/2021 1:00:34 PM By: Hansel Feinstein Entered By: Hansel Feinstein on 07/20/2021 10:07:15 [name] [name] ([id number]) -------------------------------------------------------------------------------- Clinic Level of Care Assessment Details Patient Name: [name] [name] Date of Service: 07/20/2021 10:00 AM Medical Record Number: [id number] Patient Account Number: [id number] Date of Birth/Sex: Apr 07, 1918 (87 y.o. F) Treating RN: Hansel Feinstein Primary Care Margarethe Virgen: Einar Crow Other Clinician: Referring Palmyra Rogacki: Einar Crow Treating Gracelee Stemmler/Extender: Rowan Blase in Treatment: 14 Clinic Level of Care Assessment Items TOOL 4 Quantity Score []  - Use when only an EandM is performed on FOLLOW-UP visit 0 ASSESSMENTS - Nursing Assessment / Reassessment []  - Reassessment of Co-morbidities (includes updates in  patient status) 0 []  - 0 Reassessment of Adherence to Treatment Plan ASSESSMENTS - Wound and Skin Assessment / Reassessment X - Simple Wound Assessment / Reassessment - one wound 1 5 []  - 0 Complex Wound Assessment / Reassessment - multiple wounds []  - 0 Dermatologic / Skin Assessment (not related to wound area) ASSESSMENTS - Focused Assessment []  - Circumferential Edema Measurements - multi extremities 0 []  - 0 Nutritional Assessment / Counseling / Intervention []  - 0 Lower Extremity Assessment (monofilament, tuning fork, pulses) []  - 0 Peripheral Arterial Disease Assessment (using hand held doppler) ASSESSMENTS - Ostomy and/or Continence Assessment and Care []  - Incontinence Assessment and Management 0 []  - 0 Ostomy Care Assessment and Management (repouching, etc.) PROCESS - Coordination of Care X - Simple Patient / Family Education for ongoing care 1 15 []  - 0 Complex (extensive) Patient / Family Education for ongoing care []  - 0 Staff obtains Consents, Records, Test Results / Process Orders X- 1 10 Staff telephones HHA, Nursing Homes / Clarify orders / etc []  - 0 Routine Transfer to another Facility (non-emergent condition) []  - 0 Routine Hospital Admission (non-emergent condition) []  - 0 New Admissions / / Ordering NPWT, Apligraf, etc. []  - 0 Emergency Hospital Admission (emergent condition) X- 1 10 Simple Discharge Coordination []  - 0 Complex (extensive) Discharge Coordination PROCESS - Special Needs []  - Pediatric / Minor Patient Management 0 []  - 0 Isolation Patient Management []  - 0 Hearing / Language / Visual special needs []  - 0 Assessment of Community assistance (transportation, D/C planning, etc.) []  - 0 Additional assistance / Altered mentation []  - 0 Support Surface(s) Assessment (bed, cushion, seat, etc.) INTERVENTIONS - Wound Cleansing / Measurement Filippi, Emira ( ) X- 1 5 Simple Wound Cleansing - one  wound []  - 0 Complex Wound Cleansing - multiple wounds []  - 0 Wound Imaging (photographs - any number of wounds) []  - 0 Wound Tracing (instead of photographs) X- 1 5 Simple Wound Measurement - one wound []  -  0 Complex Wound Measurement - multiple wounds INTERVENTIONS - Wound Dressings X - Small Wound Dressing one or multiple wounds 1 10 []  - 0 Medium Wound Dressing one or multiple wounds []  - 0 Large Wound Dressing one or multiple wounds []  - 0 Application of Medications - topical []  - 0 Application of Medications - injection INTERVENTIONS - Miscellaneous []  - External ear exam 0 []  - 0 Specimen Collection (cultures, biopsies, blood, body fluids, etc.) []  - 0 Specimen(s) / Culture(s) sent or taken to Lab for analysis X- 1 10 Patient Transfer (multiple staff / / Similar devices) []  - 0 Simple Staple / Suture removal (25 or less) []  - 0 Complex Staple / Suture removal (26 or more) []  - 0 Hypo / Hyperglycemic Management (close monitor of Blood Glucose) []  - 0 Ankle / Brachial Index (ABI) - do not check if billed separately []  - 0 Vital Signs Has the patient been seen at the hospital within the last three years: Yes Total Score: 70 Level Of Care: New/Established - Level 2 Electronic Signature(s) Signed: 07/20/2021 1:00:34 PM By: Entered By: on 07/20/2021 10:21:18 ( ) -------------------------------------------------------------------------------- Encounter Discharge Information Details Patient Name: [name] [name], adult Date of Service: 07/20/2021 10:00 AM Medical Record Number: Patient Account Number: Date of Birth/Sex: [birth year] (87 y.o. F) Treating RN: Primary Care Syriah Delisi: 09/19/2021 Other Clinician: Referring Lauranne Beyersdorf: Hansel Feinstein Treating Mar Zettler/Extender: Hansel Feinstein in Treatment: 14 Encounter Discharge Information Items Discharge Condition:  Stable Ambulatory Status: Wheelchair Discharge Destination: Skilled Nursing Facility Telephoned: No Orders Sent: Yes Transportation: Other Accompanied By: POA Schedule Follow-up Appointment: Yes Clinical Summary of Care: Electronic Signature(s) Signed: 07/20/2021 1:00:34 PM By: [name] [name] Entered By: [id number] on 07/20/2021 10:20:17 09/19/2021 ([id number]) -------------------------------------------------------------------------------- Wound Assessment Details Patient Name: [id number] Date of Service: 07/20/2021 10:00 AM Medical Record Number: Hansel Feinstein Patient Account Number: Einar Crow Date of Birth/Sex: 11-Mar-1918 (87 y.o. F) Treating RN: Rowan Blase Primary Care Julen Rubert: 09/19/2021 Other Clinician: Referring Mckensi Redinger: Hansel Feinstein Treating Brad Lieurance/Extender: Hansel Feinstein in Treatment: 14 Wound Status Wound Number: 1 Primary Etiology: Pressure Ulcer Wound Location: Left Calcaneus Wound Status: Open Wounding Event: Gradually Appeared Comorbid Chronic Obstructive Pulmonary Disease (COPD), History: Dementia Date Acquired: 01/12/2021 Weeks Of Treatment: 14 Clustered Wound: No Wound Measurements Length: (cm) 1 Width: (cm) 0.7 Depth: (cm) 0.3 Area: (cm) 0.55 Volume: (cm) 0.165 % Reduction in Area: 27.1% % Reduction in Volume: 27% Epithelialization: None Wound Description Classification: Category/Stage III Exudate Amount: Medium Exudate Type: Serosanguineous Exudate Color: red, brown Foul Odor After Cleansing: No Slough/Fibrino Yes Wound Bed Granulation Amount: Medium (34-66%) Exposed Structure Granulation Quality: Red, Pink, Pale Fascia Exposed: No Necrotic Amount: Medium (34-66%) Fat Layer (Subcutaneous Tissue) Exposed: Yes Necrotic Quality: Adherent Slough Tendon Exposed: No Muscle Exposed: No Joint Exposed: No Bone Exposed: No Assessment Notes POA reports that SNF is unable to provide an air mattress, but arrived with pravalon  boots Treatment Notes Wound #1 (Calcaneus) Wound Laterality: Left Cleanser Soap and Water Discharge Instruction: Gently cleanse wound with antibacterial soap, rinse and pat dry prior to dressing wounds Peri-Wound Care Topical Primary Dressing Silvercel Small 2x2 (in/in) Discharge Instruction: Apply Silvercel Small 2x2 (in/in) as instructed Secondary Dressing Mepilex Border Flex, 4x4 (in/in) Discharge Instruction: Apply to LEFT HEEL as directed AND RIGHT HEEL FOR PROTECTION. Do not cut. Secured With [place], [id number] ([name] [name]) Compression Wrap Compression Stockings Add-Ons Electronic Signature(s) Signed: 07/20/2021 1:00:34 PM By: [id number] Entered By: [id number],  Joy on 07/20/2021 10:03:31

## 2021-07-23 IMAGING — DX DG CHEST 1V PORT
1 series · 1 of 1 positions shown · non-contrast
Comparison: 11/06/2020.

CLINICAL DATA: Hypoxia.

EXAM:
PORTABLE CHEST 1 VIEW

[chest ap]
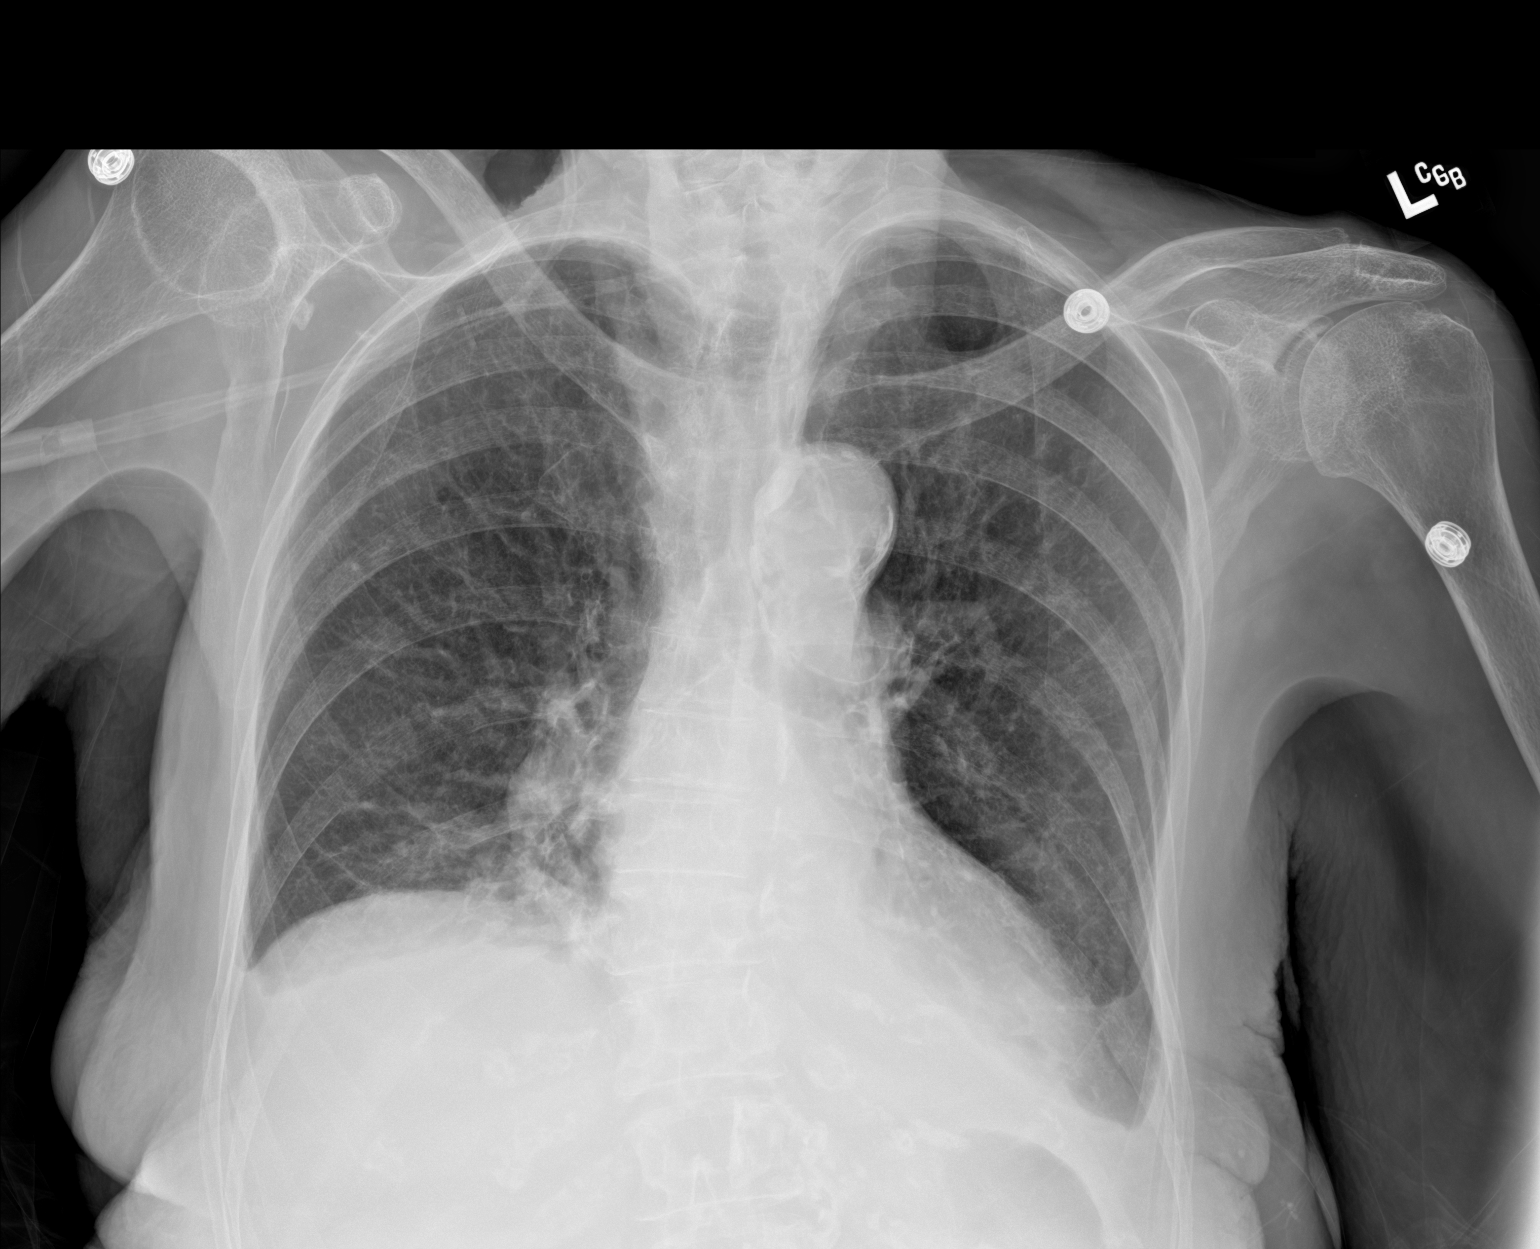

[1 of 1 positions shown; findings below may reference images not displayed]

FINDINGS: Mediastinum and hilar structures normal. Heart size normal.
Bibasilar atelectasis/infiltrates. Small bilateral pleural
effusions. Heart size normal. Degenerative changes scoliosis
thoracic spine.
IMPRESSION: Bibasilar atelectasis/infiltrates. Small bilateral pleural
effusions.

## 2021-08-03 ENCOUNTER — Encounter: Payer: Medicare Other | Admitting: Physician Assistant

## 2021-08-03 ENCOUNTER — Other Ambulatory Visit: Payer: Self-pay

## 2021-08-03 DIAGNOSIS — L89623 Pressure ulcer of left heel, stage 3: Secondary | ICD-10-CM | POA: Diagnosis not present

## 2021-08-03 NOTE — Progress Notes (Addendum)
ODIE, RAUEN ([id number]) Visit Report for 08/03/2021 Chief Complaint Document Details Patient Name: [name] [name], [name] [name] Date of Service: 08/03/2021 2:15 PM Medical Record Number: [id number] Patient Account Number: [id number] Date of Birth/Sex: June 06, 1918 (87 y.o. F) Treating RN: Hansel Feinstein Primary Care Provider: Einar Crow Other Clinician: Referring Provider: Einar Crow Treating Provider/Extender: Rowan Blase in Treatment: 16 Information Obtained from: Patient Chief Complaint 04/07/2021; patient is here for wounds on her bilateral heels towards the tips of both Electronic Signature(s) Signed: 08/03/2021 2:12:02 PM By: Lenda Kelp PA-C Entered By: Lenda Kelp on 08/03/2021 14:12:01 [name] [name] ([id number]) -------------------------------------------------------------------------------- HPI Details Patient Name: [name] [name] Date of Service: 08/03/2021 2:15 PM Medical Record Number: [id number] Patient Account Number: [id number] Date of Birth/Sex: 1918/04/17 (87 y.o. F) Treating RN: Hansel Feinstein Primary Care Provider: Einar Crow Other Clinician: Referring Provider: Einar Crow Treating Provider/Extender: Rowan Blase in Treatment: 16 History of Present Illness HPI Description: She cameADMISSION 04/07/2021 This is a 87 year old woman who is currently a resident at [facility] skilled facility. She is here with her niece who is her primary family member. Prior to December she also was her caregiver. Apparently she was hospitalized from 11/06/2020 through 11/13/2020 with a right femoral neck fracture requiring a hip replacement. She was sent to [facility] for rehabilitation. She developed bilateral heel pressure ulcers over the tip of her heels. She is nonambulatory. Her niece is uncertain about the degree of pressure relief. However they have been using Santyl as the primary dressing for about 2 months. Past medical history  includes COPD now O2 dependent, dementia, hypothyroidism, Alzheimer's disease, history of protein calorie malnutrition although her albumin while in the hospital was 3.4, she has essential tremor although she is on Sinemet and has been for many years, hypothyroidism. She is nonambulatory. ABIs in our clinic were 1.45 on the right and 1.36 on the left 5/11; patient with bilateral pressure ulcers on the tips of her heels. We have been using silver collagen. She is at the facility. In spite of concerns that her dressings do not get changed on schedule and she is not wearing her boots at night, the surface area is actually smaller. 5/25; 2-week follow-up. The right heel continues to do well and is closing in. The left had subcutaneous debris on the surface and although it measures smaller is not anywhere close to healing. 6/8; 2-week follow-up. Both heels continue to better in fact the right heel is for all intents and purposes closed. We have been using silver collagen. She is at [facility] and since the last time we saw her she is now 89!! 6/22; 2-week follow-up. And with no dressings on her heels and to be truthful I am not exactly sure what they are doing in the nursing home if anything to these heels. Nevertheless the area on the right is healed. The area on the left still is open. Thick callus and skin around the edges of the wounds. We are using silver collagen supposedly 7/13; patient presents for 2-week follow-up. She resides in a facility and it is inconsistent whether she is wearing Prevalon boots to help heal the heel wound. She has no complaints today. She has been using collagen to the left heel wound. She denies signs of infection. 7/27; patient presents for 2-week follow-up. Her daughter is present. Patient is residing at [facility] and not satisfied with the care. She is often found without her Prevalon boots on. It is not clear if they are changing the  dressing either. She  denies signs of infection. 08/03/2021 upon evaluation today patient appears to be doing excellent in regard to her heels as well as in regard to her gluteal region. There is nothing open on the gluteus on the left heel is the one spot where there is a small opening still there based on what we see today. This is minimal however. Overall I think that the patient is very close to complete resolution which is great news. Electronic Signature(s) Signed: 08/03/2021 5:04:51 PM By: Lenda KelpStone III, Shad Ledvina PA-C Entered By: Lenda KelpStone III, Dayquan Buys on 08/03/2021 17:04:50 [name] RowerWALKER, Taylor ([id number]) -------------------------------------------------------------------------------- Physical Exam Details Patient Name: [name] RowerWALKER, [name] Date of Service: 08/03/2021 2:15 PM Medical Record Number: [id number] Patient Account Number: [id number] Date of Birth/Sex: 04/19/1918 (87 y.o. F) Treating RN: Hansel FeinsteinBishop, Joy Primary Care Provider: Einar CrowAnderson, Marshall Other Clinician: Referring Provider: Einar CrowAnderson, Marshall Treating Provider/Extender: Rowan BlaseStone, Daisean Brodhead Weeks in Treatment: 16 Constitutional Well-nourished and well-hydrated in no acute distress. Respiratory normal breathing without difficulty. Psychiatric this patient is able to make decisions and demonstrates good insight into disease process. Alert and Oriented x 3. pleasant and cooperative. Notes Upon inspection patient's wound bed showed signs of good granulation epithelization at this point. Fortunately there does not appear to be any signs of active infection whatsoever and in general I think that she has just a very tiny area that might be open may even be close but I think nonetheless it would be beneficial for us to continue to keep this monitored and dressed with appropriate offloading as well as the Prevalon boots for the next several weeks. Electronic Signature(s) Signed: 08/03/2021 5:05:25 PM By: Lenda KelpStone III, Laurell Coalson PA-C Entered By: Lenda KelpStone III, Normajean Nash on 08/03/2021  17:05:25 [name] RowerWALKER, Tyrhonda ([id number]) -------------------------------------------------------------------------------- Physician Orders Details Patient Name: [name] RowerWALKER, Melodi Date of Service: 08/03/2021 2:15 PM Medical Record Number: [id number] Patient Account Number: [id number] Date of Birth/Sex: 08/14/1918 (87 y.o. F) Treating RN: Hansel FeinsteinBishop, Joy Primary Care Provider: Einar CrowAnderson, Marshall Other Clinician: Referring Provider: Einar CrowAnderson, Marshall Treating Provider/Extender: Rowan BlaseStone, Azariel Banik Weeks in Treatment: 16 Verbal / Phone Orders: No Diagnosis Coding ICD-10 Coding Code Description L89.613 Pressure ulcer of right heel, stage 3 L89.623 Pressure ulcer of left heel, stage 3 [facility] Center Treatment Complete Follow-up Appointments o Return Appointment in: - as needed per POA request-one month Non-Wound Condition o Additional non-wound orders/instructions: - ***HEELS-CONTINUE WEARING PROTECTIVE DRESSING*** ***If desired schedule DERMATOLOGY FOR LEFT EAR Off-Loading o Gel wheelchair cushion - TO OFFLOAD BONY PROMINENCE o Low air-loss mattress (Group 2) - AIR MATTRESS SUGGESTED FOR PRESSURE BREAKDOWN AND WEIGHT LOSS o Turn and reposition every 2 hours - Keep off-loading (ie: sage boots) PREVALON booties on feet while in the bed, float heels. Float heels when sitting in wheelchair. IT'S IMPORTANT TO KEEP PRESSURE OFF OF HEELS AND BONY PROMINENCES-monitor areas for any further skin breakdown TRY TO AVOID CROSSING LEGS Additional Orders / Instructions o Follow Nutritious Diet and Increase Protein Intake - Continue protein supplements to promote wound healing Medications-Please add to medication list. o Other: - LEFT great toe apply skin prep to areas for protection twice daily-suggest foot cradle or similar to keep sheets off of feet-or keep protective booties on feet when in bed Wound Treatment Wound #1 - Calcaneus Wound Laterality:  Left Cleanser: Soap and Water 1 x Per Day/30 Days Discharge Instructions: Gently cleanse wound with antibacterial soap, rinse and pat dry prior to dressing wounds Secondary Dressing: Mepilex Border Flex, 4x4 (in/in) 1 x Per Day/30 Days Discharge  Instructions: Apply to LEFT HEEL as directed AND RIGHT HEEL FOR PROTECTION. Do not cut. Electronic Signature(s) Signed: 08/03/2021 4:46:26 PM By: Hansel Feinstein Signed: 08/03/2021 5:51:22 PM By: Lenda Kelp PA-C Entered By: Hansel Feinstein on 08/03/2021 15:12:27 [name] [name] ([id number]) -------------------------------------------------------------------------------- Problem List Details Patient Name: [name] [name] Date of Service: 08/03/2021 2:15 PM Medical Record Number: [id number] Patient Account Number: [id number] Date of Birth/Sex: 05-21-1918 (87 y.o. F) Treating RN: Hansel Feinstein Primary Care Provider: Einar Crow Other Clinician: Referring Provider: Einar Crow Treating Provider/Extender: Rowan Blase in Treatment: 16 Active Problems ICD-10 Encounter Code Description Active Date MDM Diagnosis L89.613 Pressure ulcer of right heel, stage 3 04/07/2021 No Yes L89.623 Pressure ulcer of left heel, stage 3 04/07/2021 No Yes Inactive Problems Resolved Problems Electronic Signature(s) Signed: 08/03/2021 2:11:56 PM By: Lenda Kelp PA-C Entered By: Lenda Kelp on 08/03/2021 14:11:56 [name] [name] ([id number]) -------------------------------------------------------------------------------- Progress Note Details Patient Name: [name] [name] Date of Service: 08/03/2021 2:15 PM Medical Record Number: [id number] Patient Account Number: [id number] Date of Birth/Sex: [birth year] (87 y.o. F) Treating RN: Hansel Feinstein Primary Care Provider: Einar Crow Other Clinician: Referring Provider: Einar Crow Treating Provider/Extender: Rowan Blase in Treatment: 16 Subjective Chief Complaint Information obtained  from Patient 04/07/2021; patient is here for wounds on her bilateral heels towards the tips of both History of Present Illness (HPI) She cameADMISSION 04/07/2021 This is a 87 year old woman who is currently a resident at [facility] skilled facility. She is here with her niece who is her primary family member. Prior to December she also was her caregiver. Apparently she was hospitalized from 11/06/2020 through 11/13/2020 with a right femoral neck fracture requiring a hip replacement. She was sent to [facility] for rehabilitation. She developed bilateral heel pressure ulcers over the tip of her heels. She is nonambulatory. Her niece is uncertain about the degree of pressure relief. However they have been using Santyl as the primary dressing for about 2 months. Past medical history includes COPD now O2 dependent, dementia, hypothyroidism, Alzheimer's disease, history of protein calorie malnutrition although her albumin while in the hospital was 3.4, she has essential tremor although she is on Sinemet and has been for many years, hypothyroidism. She is nonambulatory. ABIs in our clinic were 1.45 on the right and 1.36 on the left 5/11; patient with bilateral pressure ulcers on the tips of her heels. We have been using silver collagen. She is at the facility. In spite of concerns that her dressings do not get changed on schedule and she is not wearing her boots at night, the surface area is actually smaller. 5/25; 2-week follow-up. The right heel continues to do well and is closing in. The left had subcutaneous debris on the surface and although it measures smaller is not anywhere close to healing. 6/8; 2-week follow-up. Both heels continue to better in fact the right heel is for all intents and purposes closed. We have been using silver collagen. She is at [facility] and since the last time we saw her she is now 89!! 6/22; 2-week follow-up. And with no dressings on her heels and to be  truthful I am not exactly sure what they are doing in the nursing home if anything to these heels. Nevertheless the area on the right is healed. The area on the left still is open. Thick callus and skin around the edges of the wounds. We are using silver collagen supposedly 7/13; patient presents for 2-week follow-up. She resides in a facility and it  is inconsistent whether she is wearing Prevalon boots to help heal the heel wound. She has no complaints today. She has been using collagen to the left heel wound. She denies signs of infection. 7/27; patient presents for 2-week follow-up. Her daughter is present. Patient is residing at [facility] and not satisfied with the care. She is often found without her Prevalon boots on. It is not clear if they are changing the dressing either. She denies signs of infection. 08/03/2021 upon evaluation today patient appears to be doing excellent in regard to her heels as well as in regard to her gluteal region. There is nothing open on the gluteus on the left heel is the one spot where there is a small opening still there based on what we see today. This is minimal however. Overall I think that the patient is very close to complete resolution which is great news. Objective Constitutional Well-nourished and well-hydrated in no acute distress. Vitals Time Taken: 2:41 PM, Height: 64 in, Weight: 100 lbs, BMI: 17.2, Temperature: 98.0 F, Pulse: 53 bpm, Respiratory Rate: 18 breaths/min, Blood Pressure: 87/60 mmHg. General Notes: POA stated she attempted to feed her lunch and she refuses to drink/no c/o voiced from pt- Respiratory normal breathing without difficulty. Psychiatric this patient is able to make decisions and demonstrates good insight into disease process. Alert and Oriented x 3. pleasant and cooperative. ELAURA, CALIX ([id number]) General Notes: Upon inspection patient's wound bed showed signs of good granulation epithelization at this point.  Fortunately there does not appear to be any signs of active infection whatsoever and in general I think that she has just a very tiny area that might be open may even be close but I think nonetheless it would be beneficial for [place] to continue to keep this monitored and dressed with appropriate offloading as well as the Prevalon boots for the next several weeks. Integumentary (Hair, Skin) Wound #1 status is Open. Original cause of wound was Gradually Appeared. The date acquired was: 01/12/2021. The wound has been in treatment 16 weeks. The wound is located on the Left Calcaneus. The wound measures 0.1cm length x 0.1cm width x 0.1cm depth; 0.008cm^2 area and 0.001cm^3 volume. There is a none present amount of drainage noted. There is no granulation within the wound bed. There is no necrotic tissue within the wound bed. Assessment Active Problems ICD-10 Pressure ulcer of right heel, stage 3 Pressure ulcer of left heel, stage 3 Plan Discharge From [facility]: Discharge from Wound Care Center Treatment Complete Follow-up Appointments: Return Appointment in: - as needed per POA request-one month Non-Wound Condition: Additional non-wound orders/instructions: - ***HEELS-CONTINUE WEARING PROTECTIVE DRESSING*** ***If desired schedule DERMATOLOGY FOR LEFT EAR Off-Loading: Gel wheelchair cushion - TO OFFLOAD BONY PROMINENCE Low air-loss mattress (Group 2) - AIR MATTRESS SUGGESTED FOR PRESSURE BREAKDOWN AND WEIGHT LOSS Turn and reposition every 2 hours - Keep off-loading (ie: sage boots) PREVALON booties on feet while in the bed, float heels. Float heels when sitting in wheelchair. IT'S IMPORTANT TO KEEP PRESSURE OFF OF HEELS AND BONY PROMINENCES-monitor areas for any further skin breakdown TRY TO AVOID CROSSING LEGS Additional Orders / Instructions: Follow Nutritious Diet and Increase Protein Intake - Continue protein supplements to promote wound healing Medications-Please add to medication  list.: Other: - LEFT great toe apply skin prep to areas for protection twice daily-suggest foot cradle or similar to keep sheets off of feet-or keep protective booties on feet when in bed WOUND #1: - Calcaneus Wound Laterality: Left Cleanser: Soap  and Water 1 x Per Day/30 Days Discharge Instructions: Gently cleanse wound with antibacterial soap, rinse and pat dry prior to dressing wounds Secondary Dressing: Mepilex Border Flex, 4x4 (in/in) 1 x Per Day/30 Days Discharge Instructions: Apply to LEFT HEEL as directed AND RIGHT HEEL FOR PROTECTION. Do not cut. 1. Would recommend that we continue with the heel cups to both heels I think this is good. Overall I think believe that for the left heel we will be able to go ahead and use the border foam dressing for protection here as well. 2. I am also can recommend the patient really should have an air mattress I think considering her age and fragility as well as the easily broken down skin it would benefit the facility to go ahead and take precautionary measures here. With that being said I put this in the order here today as well. 3. I am also can recommend that we have the patient continue with appropriate offloading that should be attempted and proceeded with at the facility. Honestly the current stance on offloading is that the patient should reposition every 2 hours at least. Again that is something that I would definitely continue to recommend as well. 4. She SHOULD also continue to wear the Prevalon boots at all times. This is the best thing for heel offloading. Follow-up for Electronic Signature(s) Signed: 08/03/2021 5:06:55 PM By: Lenda Kelp PA-C Entered By: Lenda Kelp on 08/03/2021 17:06:55 [name] [name] ([id number]) -------------------------------------------------------------------------------- SuperBill Details Patient Name: [name] [name] Date of Service: 08/03/2021 Medical Record Number: [id number] Patient Account Number:  [id number] Date of Birth/Sex: [birth year] (87 y.o. F) Treating RN: Hansel Feinstein Primary Care Provider: Einar Crow Other Clinician: Referring Provider: Einar Crow Treating Provider/Extender: Rowan Blase in Treatment: 16 Diagnosis Coding ICD-10 Codes Code Description [phone] Pressure ulcer of right heel, stage 3 L89.623 Pressure ulcer of left heel, stage 3 Facility Procedures CPT4 Code: [id number] Description: 99213 - WOUND CARE VISIT-LEV 3 EST PT Modifier: Quantity: 1 Physician Procedures CPT4 Code: [id number] Description: 99214 - WC PHYS LEVEL 4 - EST PT Modifier: Quantity: 1 CPT4 Code: Description: ICD-10 Diagnosis Description L89.613 Pressure ulcer of right heel, stage 3 L89.623 Pressure ulcer of left heel, stage 3 Modifier: Quantity: Electronic Signature(s) Signed: 08/03/2021 5:07:32 PM By: Lenda Kelp PA-C Previous Signature: 08/03/2021 4:46:26 PM Version By: Hansel Feinstein Entered By: Lenda Kelp on 08/03/2021 17:07:31

## 2021-08-31 ENCOUNTER — Ambulatory Visit: Payer: Medicare Other | Admitting: Physician Assistant

## 2021-12-12 DEATH — deceased
